# Patient Record
Sex: Male | Born: 1960 | Race: White | Hispanic: No | Marital: Married | Smoking: Never smoker
Health system: Southern US, Community
[De-identification: ages and names within clinical notes are randomized; demographics above are authoritative.]

## PROBLEM LIST (undated history)

## (undated) DIAGNOSIS — Z87442 Personal history of urinary calculi: Secondary | ICD-10-CM

## (undated) DIAGNOSIS — E7800 Pure hypercholesterolemia, unspecified: Secondary | ICD-10-CM

## (undated) HISTORY — PX: HERNIA REPAIR: SHX51

---

## 2002-11-22 ENCOUNTER — Encounter: Payer: Self-pay | Admitting: Family Medicine

## 2002-11-22 ENCOUNTER — Encounter: Admission: RE | Admit: 2002-11-22 | Discharge: 2002-11-22 | Payer: Self-pay | Admitting: Family Medicine

## 2006-06-10 ENCOUNTER — Emergency Department (HOSPITAL_COMMUNITY): Admission: EM | Admit: 2006-06-10 | Discharge: 2006-06-11 | Payer: Self-pay | Admitting: Emergency Medicine

## 2008-07-31 ENCOUNTER — Ambulatory Visit: Payer: Self-pay | Admitting: Family Medicine

## 2008-07-31 DIAGNOSIS — M5430 Sciatica, unspecified side: Secondary | ICD-10-CM | POA: Insufficient documentation

## 2008-07-31 DIAGNOSIS — E785 Hyperlipidemia, unspecified: Secondary | ICD-10-CM | POA: Insufficient documentation

## 2008-08-01 ENCOUNTER — Encounter: Admission: RE | Admit: 2008-08-01 | Discharge: 2008-09-21 | Payer: Self-pay | Admitting: Family Medicine

## 2008-08-09 ENCOUNTER — Encounter: Payer: Self-pay | Admitting: Family Medicine

## 2008-08-22 ENCOUNTER — Encounter: Payer: Self-pay | Admitting: Family Medicine

## 2008-10-30 ENCOUNTER — Telehealth: Payer: Self-pay | Admitting: Family Medicine

## 2008-11-16 ENCOUNTER — Encounter: Payer: Self-pay | Admitting: Family Medicine

## 2008-11-17 LAB — CONVERTED CEMR LAB
ALT: 22 units/L (ref 0–53)
AST: 18 units/L (ref 0–37)
Albumin: 4.9 g/dL (ref 3.5–5.2)
Alkaline Phosphatase: 51 units/L (ref 39–117)
BUN: 20 mg/dL (ref 6–23)
CO2: 21 meq/L (ref 19–32)
Calcium: 9.8 mg/dL (ref 8.4–10.5)
Chloride: 103 meq/L (ref 96–112)
Cholesterol: 252 mg/dL — ABNORMAL HIGH (ref 0–200)
Creatinine, Ser: 0.87 mg/dL (ref 0.40–1.50)
Glucose, Bld: 95 mg/dL (ref 70–99)
HDL: 40 mg/dL (ref >39)
LDL Cholesterol: 169 mg/dL — ABNORMAL HIGH (ref 0–99)
Potassium: 4.4 meq/L (ref 3.5–5.3)
Sodium: 139 meq/L (ref 135–145)
Total Bilirubin: 1.3 mg/dL — ABNORMAL HIGH (ref 0.3–1.2)
Total CHOL/HDL Ratio: 6.3
Total Protein: 7.7 g/dL (ref 6.0–8.3)
Triglycerides: 216 mg/dL — ABNORMAL HIGH (ref <150)
VLDL: 43 mg/dL — ABNORMAL HIGH (ref 0–40)

## 2010-10-22 NOTE — Assessment & Plan Note (Signed)
Summary: NOV sciatica   Vital Signs:  Patient Profile:   50 Years Old Male Height:     71 inches Weight:      186 pounds BMI:     26.04 Pulse rate:   62 / minute BP sitting:   134 / 88  (left arm) Cuff size:   regular  Vitals Entered By: Harlene Salts (July 31, 2008 2:58 PM)                 PCP:  Seymour Bars DO  Chief Complaint:  NOV, low back pain since Wednesday, and Back pain.  History of Present Illness:  Back Pain      This is a 50 year old man who presents with Back pain.  The symptoms began duration > 3 days ago.  Hx of Sciatica.  This started 5 days ago.  Hurt on the way up from leaning over.  Bilat tingling, worse on the R now.  The patient denies fever, weakness, loss of sensation, urinary incontinence, urinary retention, and dysuria.  The pain is located in the right low back and right SI joint.  The pain began at home and suddenly.  The pain radiates to the right buttock and right leg below the knee.  The pain is made worse by standing or walking and flexion.  The pain is made better by inactivity, NSAID medications, sitting or bending forward, and heat.      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    Married.  Has 2 kids (at [facility]).    Never smoked.    1 ETOH/ wk    Runs 25 miles/ wk    Healthy diet.   Risk Factors:  Caffeine use:  1 drinks per day  Family History Risk Factors:    Family History of MI in females < 48 years old:  no    Family History of MI in males < 110 years old:  no   Review of Systems       no fevers/sweats/weakness, unexplained wt loss/gain, no change in vision, no difficulty hearing, ringing in ears, no hay fever/allergies, no CP/discomfort, no palpitations, no breast lump/nipple discharge, no  cough/wheeze, no blood in stool, no N/V/D, no nocturia, no leaking urine, no unusual vag bleeding, no vaginal/penile discharge, no muscle/joint pain, no rash, no new/changing mole, no HA, no memory loss, no anxiety, no sleep problem, no depression, no unexplained lumps, no easy bruising/bleeding, no concern with sexual function    Physical Exam  General:     alert, well-developed, well-nourished, and well-hydrated.   Head:     normocephalic and atraumatic.   Mouth:     good dentition and pharynx pink and moist.   Lungs:     Normal respiratory effort, chest expands symmetrically. Lungs are clear to auscultation, no crackles or wheezes. Heart:     Normal rate and regular rhythm. S1 and S2 normal without gallop, murmur, click, rub or other extra sounds. Msk:     R sciatic notch tender active L spine flexion to 75 deg with 5 deg extension.  + straight leg raise bilat. tender at L5-S1 R>L side.    Extremities:     no LE edema Neurologic:  gait normal and DTRs symmetrical and normal.  normal heel toe gait Skin:     color normal.      Impression & Recommendations:  Problem # 1:  SCIATICA, ACUTE (ICD-724.3) Recurrent.  No red flags.  Treat with Rx NSAIDs, heat and PT.  OK to resume running once pain subsides ( ~1 wk).   His updated medication list for this problem includes:    Aspirin Adult Low Strength 81 Mg Tbec (Aspirin) .Marland Kitchen... Take 1 tablet by mouth once a day    Piroxicam 20 Mg Caps (Piroxicam) .Marland Kitchen... 1 tab by mouth daily w/ food  Orders: Physical Therapy Referral (PT)   Problem # 2:  HYPERLIPIDEMIA (ICD-272.4) OK to stop statin in the next 30 days and take OTC Fish Oil 4 grams per day.  Already eating healthy and exercising.  Will get old records and plan to check labs off meds > 8 wks.   His updated medication list for this problem includes:    Pravachol 40 Mg Tabs (Pravastatin sodium) .Marland Kitchen... Take two by mouth daily   Complete Medication List: 1)  Pravachol 40 Mg Tabs  (Pravastatin sodium) .... Take two by mouth daily 2)  Aspirin Adult Low Strength 81 Mg Tbec (Aspirin) .... Take 1 tablet by mouth once a day 3)  Piroxicam 20 Mg Caps (Piroxicam) .Marland Kitchen.. 1 tab by mouth daily w/ food   Patient Instructions: 1)  For acute back pain/ sciatica: 2)  Piroxicam daily w/ food x 10-14 days. 3)  Heat, physical therapy. 4)  OK to run after 1 wk if pain improving, otherwise, walk. 5)  No heavy lifting or prolonged sitting. 6)  Finish up Pravastatin and f/u to recheck cholesterol off medicine in 3 months.   Prescriptions: PIROXICAM 20 MG CAPS (PIROXICAM) 1 tab by mouth daily w/ food  #14 x 0   Entered and Authorized by:   Seymour Bars DO   Signed by:   Seymour Bars DO on 07/31/2008   Method used:   Electronically to        [facility]  [place] [address] (retail)       [address]       [place], [place]  [address]       Ph: [phone] or [phone]       Fax: [phone]   RxID:   [id number]  ]

## 2010-10-22 NOTE — Miscellaneous (Signed)
Summary: Initial Summary/[facility]  Initial Summary/[facility]   Imported By: Lanelle Bal 08/21/2008 15:07:16  _____________________________________________________________________  External Attachment:    Type:   Image     Comment:   External Document

## 2010-10-22 NOTE — Progress Notes (Signed)
Summary: NEED LAB ORDER FOR CHOLESTEROL CHECK  Phone Note Call from Patient Call back at Work Phone [phone]   Caller: Patient Call For: Christopher Bars DO Summary of Call: want lab order for cholesterol check faxed to [facility]. Initial call taken by: Harlene Salts,  October 30, 2008 1:59 PM      Appended Document: NEED LAB ORDER FOR CHOLESTEROL CHECK PATIENT INFORMED AND LAB ORDER FAXED.LM

## 2010-10-22 NOTE — Miscellaneous (Signed)
Summary: Discharge/[facility]  Discharge/[facility]   Imported By: Lanelle Bal 09/04/2008 13:58:37  _____________________________________________________________________  External Attachment:    Type:   Image     Comment:   External Document

## 2020-04-18 ENCOUNTER — Other Ambulatory Visit: Payer: Self-pay | Admitting: Radiation Therapy

## 2020-04-20 ENCOUNTER — Ambulatory Visit
Admission: RE | Admit: 2020-04-20 | Discharge: 2020-04-20 | Disposition: A | Discharge status: Home / Self Care | Payer: 59 | Source: Ambulatory Visit | Attending: Neurosurgery | Admitting: Neurosurgery

## 2020-04-20 ENCOUNTER — Other Ambulatory Visit: Payer: Self-pay | Admitting: Neurosurgery

## 2020-04-20 ENCOUNTER — Other Ambulatory Visit (HOSPITAL_COMMUNITY): Payer: Self-pay | Admitting: Neurosurgery

## 2020-04-20 ENCOUNTER — Other Ambulatory Visit: Payer: Self-pay

## 2020-04-20 DIAGNOSIS — D496 Neoplasm of unspecified behavior of brain: Secondary | ICD-10-CM

## 2020-04-20 IMAGING — CT CT CHEST-ABD-PELV W/ CM
3 of 5 series · 14 of 36 positions shown, 16 images · IV contrast (omnipaque)
Comparison: None.

CLINICAL DATA: Newly diagnosed brain tumor.

EXAM:
CT CHEST, ABDOMEN, AND PELVIS WITH CONTRAST
TECHNIQUE: Multidetector CT imaging of the chest, abdomen and pelvis was
performed following the standard protocol during bolus
administration of intravenous contrast.
CONTRAST:  100 mL OMNIPAQUE IOHEXOL 300 MG/ML  SOLN

[Series 2: cap with · axial · 0.84mm/px · z∈[-1098,-538]mm · 9 of 142 slices shown, 11 images]
[im 15/142  mediastinal]
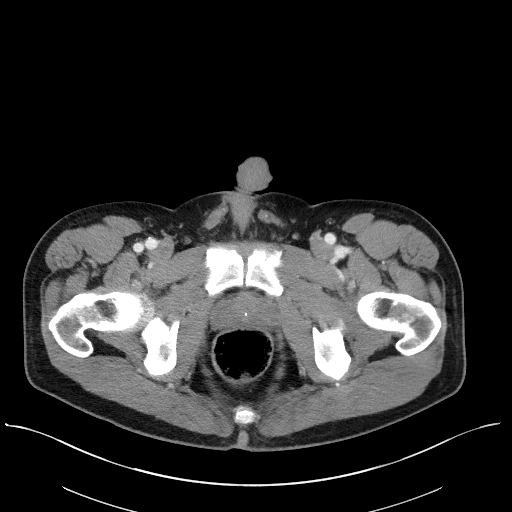
[im 15/142  bone]
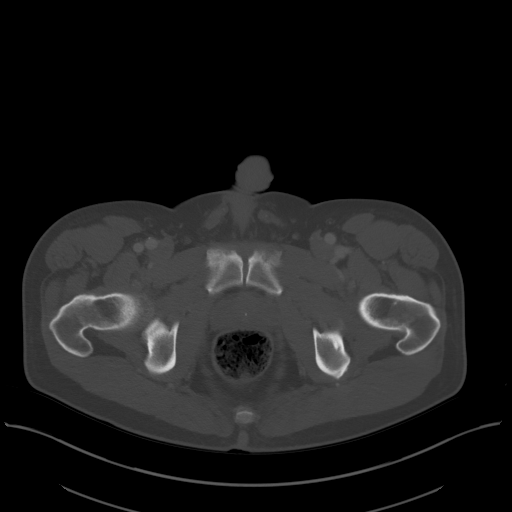
[im 29/142  mediastinal]
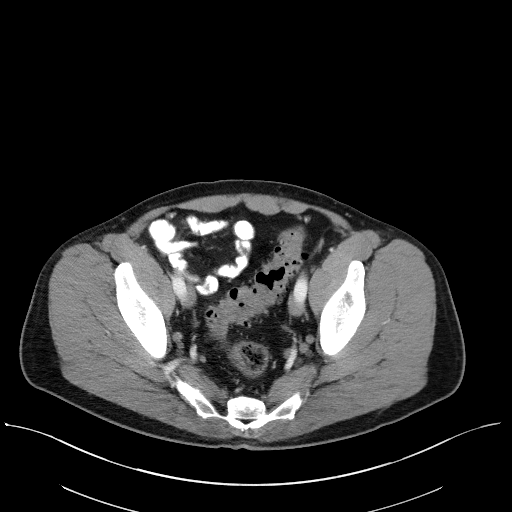
[im 43/142  mediastinal]
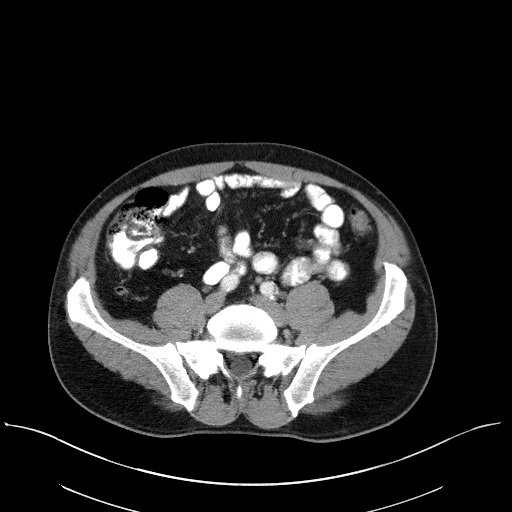
[im 57/142  mediastinal]
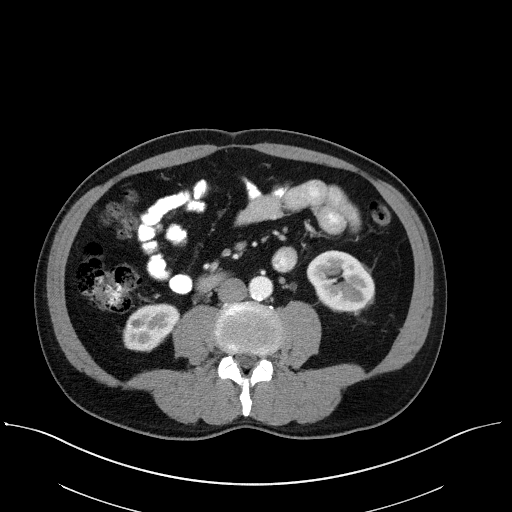
[im 71/142  mediastinal]
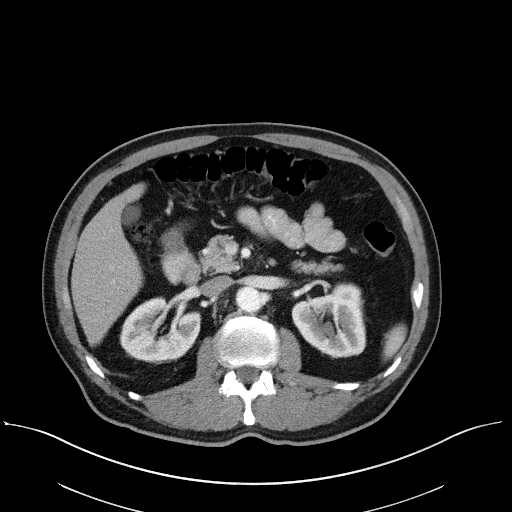
[im 85/142  mediastinal]
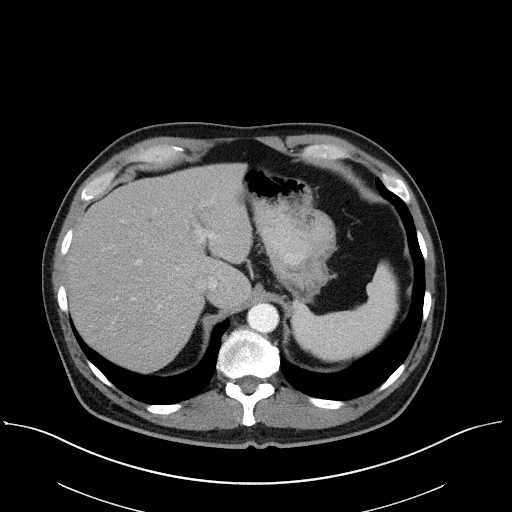
[im 99/142  mediastinal]
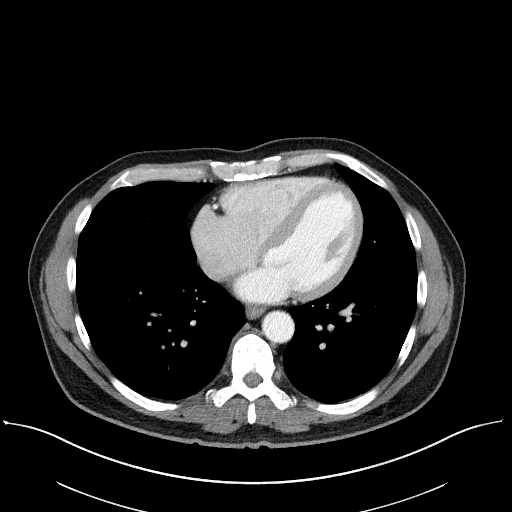
[im 113/142  mediastinal]
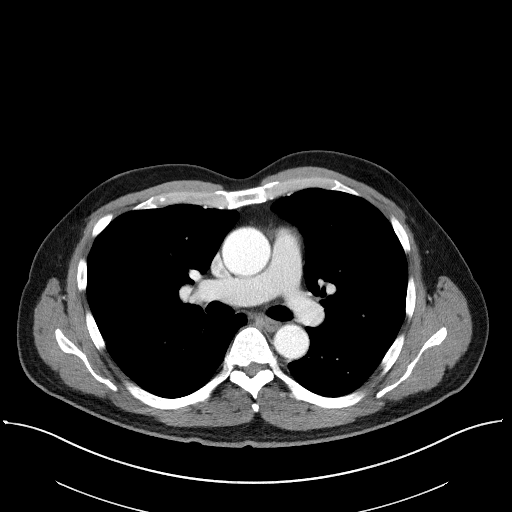
[im 127/142  mediastinal]
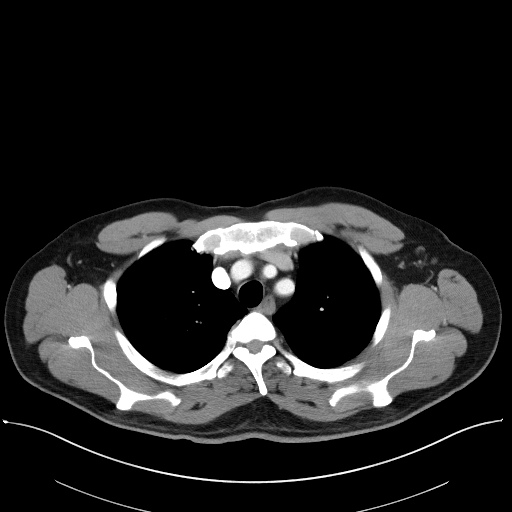
[im 127/142  bone]
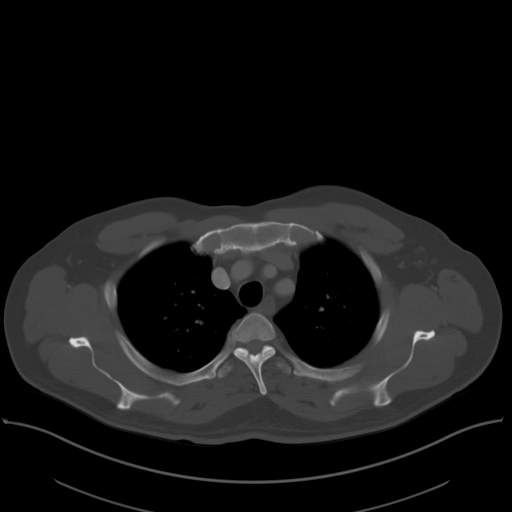

[Series 4: lung · axial · 0.84mm/px · z∈[-787,-733]mm · 2 of 176 slices shown]
[im 14/176  bone]
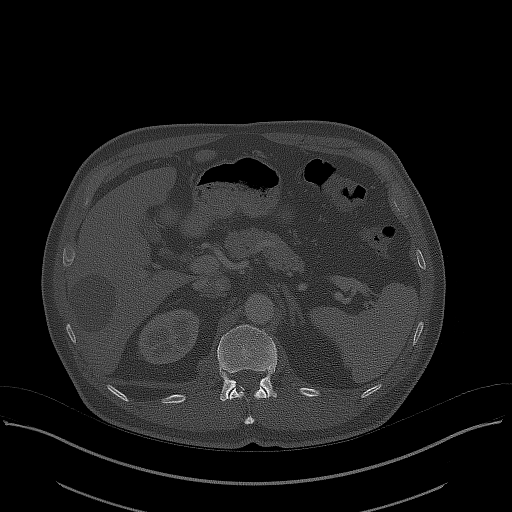
[im 41/176  bone]
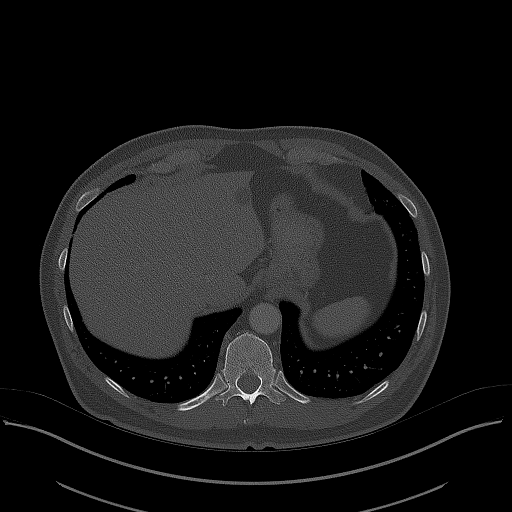

[Series 5: coronals · coronal · 0.78mm/px · 3 of 140 slices shown]
[im 28/140  mediastinal]
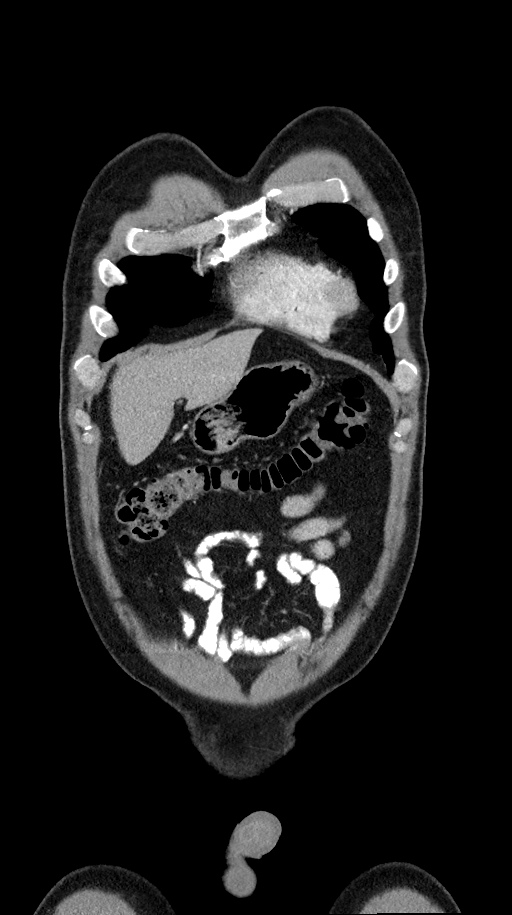
[im 56/140  mediastinal]
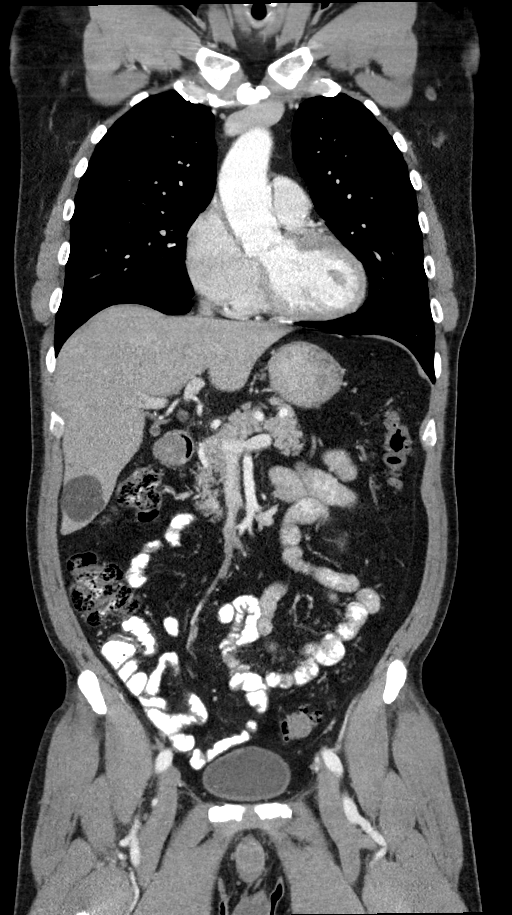
[im 84/140  mediastinal]
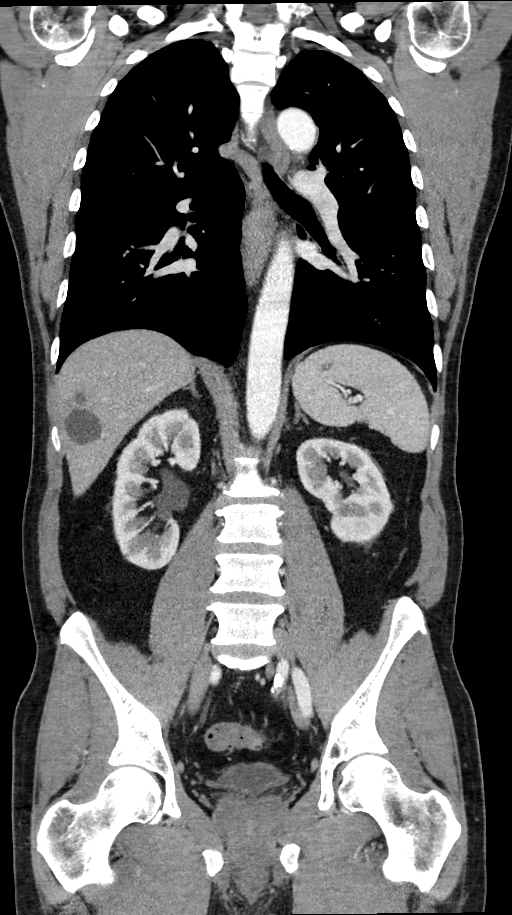

[14 of 36 positions shown; findings below may reference images not displayed]

FINDINGS: CT CHEST FINDINGS

Cardiovascular: No significant vascular findings. Normal heart size.
No pericardial effusion.

Mediastinum/Nodes: No enlarged mediastinal, hilar, or axillary lymph
nodes. Thyroid gland, trachea, and esophagus demonstrate no
significant findings.

Lungs/Pleura: Lungs are clear. No pleural effusion or pneumothorax.

Musculoskeletal: No chest wall mass or suspicious bone lesions
identified.

CT ABDOMEN PELVIS FINDINGS

Hepatobiliary: Scattered cysts are seen in the liver. The largest is
in the right hepatic lobe measuring 4.7 cm in diameter. Some of the
lesions are too small to definitively characterize but likely cysts.
Gallbladder and biliary tree appear normal.

Pancreas: Unremarkable. No pancreatic ductal dilatation or
surrounding inflammatory changes.

Spleen: Normal in size without focal abnormality.

Adrenals/Urinary Tract: The adrenal glands appear normal. A few
small renal cysts are present bilaterally. The kidneys are otherwise
normal in appearance. Ureters and urinary bladder appear normal.

Stomach/Bowel: Stomach is within normal limits. Appendix appears
normal. No evidence of bowel wall thickening, distention, or
inflammatory changes. Sigmoid diverticulosis is noted.

Vascular/Lymphatic: Aortic atherosclerosis. No enlarged abdominal or
pelvic lymph nodes.

Reproductive: Prostate is unremarkable. Small right hydrocele is
noted.

Other: None.

Musculoskeletal: No acute or significant osseous findings.
IMPRESSION: Negative for primary or metastatic neoplasm in the chest, abdomen or
pelvis. No acute abnormality.

Sigmoid diverticulosis without diverticulitis.

Hepatic and renal cysts.

Small right hydrocele is partially imaged.

Aortic Atherosclerosis ([Z6]-[Z6]).

## 2020-04-20 MED ORDER — IOHEXOL 300 MG/ML  SOLN
100.0000 mL | Freq: Once | INTRAMUSCULAR | Status: AC | PRN
  Administered 2020-04-20: 100 mL via INTRAVENOUS

## 2020-04-23 ENCOUNTER — Inpatient Hospital Stay: Payer: 59 | Attending: Neurosurgery

## 2020-04-23 ENCOUNTER — Other Ambulatory Visit: Payer: Self-pay | Admitting: Neurosurgery

## 2020-04-24 ENCOUNTER — Other Ambulatory Visit: Payer: Self-pay

## 2020-04-24 ENCOUNTER — Other Ambulatory Visit (HOSPITAL_COMMUNITY)
Admission: RE | Admit: 2020-04-24 | Discharge: 2020-04-24 | Disposition: A | Discharge status: Home / Self Care | Payer: 59 | Source: Ambulatory Visit | Attending: Neurosurgery | Admitting: Neurosurgery

## 2020-04-24 ENCOUNTER — Encounter (HOSPITAL_COMMUNITY): Payer: Self-pay | Admitting: Neurosurgery

## 2020-04-24 DIAGNOSIS — Z20822 Contact with and (suspected) exposure to covid-19: Secondary | ICD-10-CM | POA: Insufficient documentation

## 2020-04-24 DIAGNOSIS — Z01812 Encounter for preprocedural laboratory examination: Secondary | ICD-10-CM | POA: Insufficient documentation

## 2020-04-24 LAB — SARS CORONAVIRUS 2 (TAT 6-24 HRS): SARS Coronavirus 2: NEGATIVE

## 2020-04-24 NOTE — Anesthesia Preprocedure Evaluation (Addendum)
Anesthesia Evaluation  Patient identified by MRN, date of birth, ID band Patient awake    Reviewed: Allergy & Precautions, NPO status , Patient's Chart, lab work & pertinent test results  Airway Mallampati: II  TM Distance: >3 FB Neck ROM: Full    Dental no notable dental hx. (+) Teeth Intact, Dental Advisory Given   Pulmonary neg pulmonary ROS,    Pulmonary exam normal breath sounds clear to auscultation       Cardiovascular Normal cardiovascular exam Rhythm:Regular Rate:Normal  HLD   Neuro/Psych negative neurological ROS  negative psych ROS   GI/Hepatic negative GI ROS, Neg liver ROS,   Endo/Other  negative endocrine ROS  Renal/GU negative Renal ROS  negative genitourinary   Musculoskeletal negative musculoskeletal ROS (+)   Abdominal   Peds  Hematology negative hematology ROS (+)   Anesthesia Other Findings   Reproductive/Obstetrics                            Anesthesia Physical Anesthesia Plan  ASA: II  Anesthesia Plan: General   Post-op Pain Management:    Induction: Intravenous  PONV Risk Score and Plan: Midazolam, Dexamethasone and Ondansetron  Airway Management Planned: Oral ETT  Additional Equipment: Arterial line  Intra-op Plan:   Post-operative Plan: Extubation in OR  Informed Consent: I have reviewed the patients History and Physical, chart, labs and discussed the procedure including the risks, benefits and alternatives for the proposed anesthesia with the patient or authorized representative who has indicated his/her understanding and acceptance.     Dental advisory given  Plan Discussed with: CRNA  Anesthesia Plan Comments: (2 IVs)        Anesthesia Quick Evaluation

## 2020-04-24 NOTE — Progress Notes (Signed)
Spoke with pt for pre-op call. Pt denies cardiac hx, HTN or Diabetes.  Covid test done today. Pt states he's in quarantine and will stay in it until he comes to the hospital tomorrow.

## 2020-04-25 ENCOUNTER — Other Ambulatory Visit: Payer: Self-pay

## 2020-04-25 ENCOUNTER — Encounter (HOSPITAL_COMMUNITY)
Admission: RE | Disposition: A | Discharge status: Home / Self Care | Payer: Self-pay | Source: Home / Self Care | Attending: Neurosurgery

## 2020-04-25 ENCOUNTER — Inpatient Hospital Stay (HOSPITAL_COMMUNITY): Payer: 59 | Admitting: Anesthesiology

## 2020-04-25 ENCOUNTER — Encounter (HOSPITAL_COMMUNITY): Payer: Self-pay | Admitting: Neurosurgery

## 2020-04-25 ENCOUNTER — Inpatient Hospital Stay (HOSPITAL_COMMUNITY)
Admission: RE | Admit: 2020-04-25 | Discharge: 2020-04-26 | DRG: 027 | Disposition: A | Discharge status: Home / Self Care | Payer: 59 | Attending: Neurosurgery | Admitting: Neurosurgery

## 2020-04-25 DIAGNOSIS — C719 Malignant neoplasm of brain, unspecified: Secondary | ICD-10-CM | POA: Diagnosis present

## 2020-04-25 DIAGNOSIS — D496 Neoplasm of unspecified behavior of brain: Secondary | ICD-10-CM | POA: Diagnosis present

## 2020-04-25 DIAGNOSIS — Z20822 Contact with and (suspected) exposure to covid-19: Secondary | ICD-10-CM | POA: Diagnosis present

## 2020-04-25 DIAGNOSIS — E785 Hyperlipidemia, unspecified: Secondary | ICD-10-CM | POA: Diagnosis present

## 2020-04-25 DIAGNOSIS — E7800 Pure hypercholesterolemia, unspecified: Secondary | ICD-10-CM | POA: Diagnosis present

## 2020-04-25 DIAGNOSIS — Z87442 Personal history of urinary calculi: Secondary | ICD-10-CM | POA: Diagnosis not present

## 2020-04-25 DIAGNOSIS — Z79899 Other long term (current) drug therapy: Secondary | ICD-10-CM | POA: Diagnosis not present

## 2020-04-25 HISTORY — PX: CRANIOTOMY: SHX93

## 2020-04-25 HISTORY — PX: APPLICATION OF CRANIAL NAVIGATION: SHX6578

## 2020-04-25 HISTORY — DX: Personal history of urinary calculi: Z87.442

## 2020-04-25 HISTORY — DX: Pure hypercholesterolemia, unspecified: E78.00

## 2020-04-25 LAB — CBC
HCT: 46.5 % (ref 39.0–52.0)
Hemoglobin: 15.7 g/dL (ref 13.0–17.0)
MCH: 29.8 pg (ref 26.0–34.0)
MCHC: 33.8 g/dL (ref 30.0–36.0)
MCV: 88.2 fL (ref 80.0–100.0)
Platelets: 171 10*3/uL (ref 150–400)
RBC: 5.27 MIL/uL (ref 4.22–5.81)
RDW: 11.9 % (ref 11.5–15.5)
WBC: 4.8 10*3/uL (ref 4.0–10.5)
nRBC: 0 % (ref 0.0–0.2)

## 2020-04-25 LAB — ABO/RH: ABO/RH(D): A POS

## 2020-04-25 LAB — TYPE AND SCREEN
ABO/RH(D): A POS
Antibody Screen: NEGATIVE

## 2020-04-25 SURGERY — CRANIOTOMY TUMOR EXCISION
Anesthesia: General | Site: Head | Laterality: Left

## 2020-04-25 MED ORDER — FENTANYL CITRATE (PF) 100 MCG/2ML IJ SOLN: 25.0000 ug | INTRAMUSCULAR | Status: DC | PRN

## 2020-04-25 MED ORDER — SIMVASTATIN 20 MG PO TABS
40.0000 mg | ORAL_TABLET | Freq: Every day | ORAL | Status: DC
  Administered 2020-04-25: 40 mg via ORAL
  Filled 2020-04-25: qty 2

## 2020-04-25 MED ORDER — BUPIVACAINE HCL (PF) 0.5 % IJ SOLN
INTRAMUSCULAR | Status: DC | PRN
  Administered 2020-04-25: 4 mL

## 2020-04-25 MED ORDER — PROPOFOL 10 MG/ML IV BOLUS
INTRAVENOUS | Status: DC | PRN
  Administered 2020-04-25: 200 mg via INTRAVENOUS

## 2020-04-25 MED ORDER — ONDANSETRON HCL 4 MG/2ML IJ SOLN
INTRAMUSCULAR | Status: DC | PRN
  Administered 2020-04-25: 4 mg via INTRAVENOUS

## 2020-04-25 MED ORDER — BACITRACIN ZINC 500 UNIT/GM EX OINT
TOPICAL_OINTMENT | CUTANEOUS | Status: AC
  Filled 2020-04-25: qty 28.35

## 2020-04-25 MED ORDER — CHLORHEXIDINE GLUCONATE 0.12 % MT SOLN
OROMUCOSAL | Status: AC
  Administered 2020-04-25: 15 mL
  Filled 2020-04-25: qty 15

## 2020-04-25 MED ORDER — THROMBIN 5000 UNITS EX SOLR: OROMUCOSAL | Status: DC | PRN

## 2020-04-25 MED ORDER — LIDOCAINE 2% (20 MG/ML) 5 ML SYRINGE
INTRAMUSCULAR | Status: DC | PRN
  Administered 2020-04-25: 60 mg via INTRAVENOUS

## 2020-04-25 MED ORDER — CHLORHEXIDINE GLUCONATE CLOTH 2 % EX PADS: 6.0000 | MEDICATED_PAD | Freq: Once | CUTANEOUS | Status: DC

## 2020-04-25 MED ORDER — CEFAZOLIN SODIUM-DEXTROSE 2-4 GM/100ML-% IV SOLN
2.0000 g | INTRAVENOUS | Status: AC
  Administered 2020-04-25: 2 g via INTRAVENOUS
  Filled 2020-04-25: qty 100

## 2020-04-25 MED ORDER — ACETAMINOPHEN 325 MG PO TABS: 650.0000 mg | ORAL_TABLET | ORAL | Status: DC | PRN

## 2020-04-25 MED ORDER — SODIUM CHLORIDE 0.9 % IV SOLN: INTRAVENOUS | Status: DC | PRN

## 2020-04-25 MED ORDER — MIDAZOLAM HCL 5 MG/5ML IJ SOLN
INTRAMUSCULAR | Status: DC | PRN
  Administered 2020-04-25 (×2): 1 mg via INTRAVENOUS

## 2020-04-25 MED ORDER — ROCURONIUM BROMIDE 10 MG/ML (PF) SYRINGE
PREFILLED_SYRINGE | INTRAVENOUS | Status: DC | PRN
  Administered 2020-04-25: 80 mg via INTRAVENOUS
  Administered 2020-04-25: 20 mg via INTRAVENOUS
  Administered 2020-04-25: 10 mg via INTRAVENOUS

## 2020-04-25 MED ORDER — LIDOCAINE-EPINEPHRINE 1 %-1:100000 IJ SOLN
INTRAMUSCULAR | Status: DC | PRN
  Administered 2020-04-25: 4 mL

## 2020-04-25 MED ORDER — ONDANSETRON HCL 4 MG/2ML IJ SOLN: 4.0000 mg | INTRAMUSCULAR | Status: DC | PRN

## 2020-04-25 MED ORDER — BACITRACIN ZINC 500 UNIT/GM EX OINT
TOPICAL_OINTMENT | CUTANEOUS | Status: DC | PRN
  Administered 2020-04-25 (×2): 1 via TOPICAL

## 2020-04-25 MED ORDER — PANTOPRAZOLE SODIUM 40 MG IV SOLR
40.0000 mg | Freq: Every day | INTRAVENOUS | Status: DC
  Administered 2020-04-25: 40 mg via INTRAVENOUS
  Filled 2020-04-25: qty 40

## 2020-04-25 MED ORDER — EPHEDRINE SULFATE 50 MG/ML IJ SOLN
INTRAMUSCULAR | Status: DC | PRN
  Administered 2020-04-25: 10 mg via INTRAVENOUS

## 2020-04-25 MED ORDER — PHENYLEPHRINE HCL-NACL 10-0.9 MG/250ML-% IV SOLN
INTRAVENOUS | Status: DC | PRN
  Administered 2020-04-25: 25 ug/min via INTRAVENOUS

## 2020-04-25 MED ORDER — SENNA 8.6 MG PO TABS
1.0000 | ORAL_TABLET | Freq: Two times a day (BID) | ORAL | Status: DC
  Administered 2020-04-25 – 2020-04-26 (×2): 8.6 mg via ORAL
  Filled 2020-04-25 (×2): qty 1

## 2020-04-25 MED ORDER — SODIUM CHLORIDE 0.9 % IV SOLN
INTRAVENOUS | Status: DC | PRN
  Administered 2020-04-25: 1000 mg via INTRAVENOUS

## 2020-04-25 MED ORDER — SODIUM CHLORIDE 0.9 % IV SOLN: INTRAVENOUS | Status: DC

## 2020-04-25 MED ORDER — THROMBIN 20000 UNITS EX SOLR: CUTANEOUS | Status: DC | PRN

## 2020-04-25 MED ORDER — NALOXONE HCL 0.4 MG/ML IJ SOLN: 0.0800 mg | INTRAMUSCULAR | Status: DC | PRN

## 2020-04-25 MED ORDER — THROMBIN 5000 UNITS EX SOLR
CUTANEOUS | Status: AC
  Filled 2020-04-25: qty 5000

## 2020-04-25 MED ORDER — LABETALOL HCL 5 MG/ML IV SOLN
10.0000 mg | INTRAVENOUS | Status: DC | PRN
  Administered 2020-04-25: 5 mg via INTRAVENOUS
  Filled 2020-04-25: qty 4

## 2020-04-25 MED ORDER — CEFAZOLIN SODIUM-DEXTROSE 2-4 GM/100ML-% IV SOLN
2.0000 g | Freq: Three times a day (TID) | INTRAVENOUS | Status: AC
  Administered 2020-04-25 – 2020-04-26 (×2): 2 g via INTRAVENOUS
  Filled 2020-04-25 (×2): qty 100

## 2020-04-25 MED ORDER — ONDANSETRON HCL 4 MG PO TABS: 4.0000 mg | ORAL_TABLET | ORAL | Status: DC | PRN

## 2020-04-25 MED ORDER — LACTATED RINGERS IV SOLN: INTRAVENOUS | Status: DC | PRN

## 2020-04-25 MED ORDER — HYDROMORPHONE HCL 1 MG/ML IJ SOLN
0.5000 mg | INTRAMUSCULAR | Status: DC | PRN
  Administered 2020-04-25 – 2020-04-26 (×2): 1 mg via INTRAVENOUS
  Filled 2020-04-25 (×2): qty 1

## 2020-04-25 MED ORDER — PROMETHAZINE HCL 12.5 MG PO TABS
12.5000 mg | ORAL_TABLET | ORAL | Status: DC | PRN
  Filled 2020-04-25: qty 2

## 2020-04-25 MED ORDER — HEMOSTATIC AGENTS (NO CHARGE) OPTIME
TOPICAL | Status: DC | PRN
  Administered 2020-04-25: 1 via TOPICAL

## 2020-04-25 MED ORDER — MIDAZOLAM HCL 2 MG/2ML IJ SOLN
INTRAMUSCULAR | Status: AC
  Filled 2020-04-25: qty 2

## 2020-04-25 MED ORDER — HYDROCODONE-ACETAMINOPHEN 5-325 MG PO TABS
1.0000 | ORAL_TABLET | ORAL | Status: DC | PRN
  Administered 2020-04-25 – 2020-04-26 (×2): 1 via ORAL
  Filled 2020-04-25 (×2): qty 1

## 2020-04-25 MED ORDER — BISACODYL 5 MG PO TBEC: 5.0000 mg | DELAYED_RELEASE_TABLET | Freq: Every day | ORAL | Status: DC | PRN

## 2020-04-25 MED ORDER — BUPIVACAINE HCL (PF) 0.5 % IJ SOLN
INTRAMUSCULAR | Status: AC
  Filled 2020-04-25: qty 30

## 2020-04-25 MED ORDER — POLYETHYLENE GLYCOL 3350 17 G PO PACK: 17.0000 g | PACK | Freq: Every day | ORAL | Status: DC | PRN

## 2020-04-25 MED ORDER — LIDOCAINE 2% (20 MG/ML) 5 ML SYRINGE
INTRAMUSCULAR | Status: AC
  Filled 2020-04-25: qty 5

## 2020-04-25 MED ORDER — EPHEDRINE 5 MG/ML INJ
INTRAVENOUS | Status: AC
  Filled 2020-04-25: qty 10

## 2020-04-25 MED ORDER — SUGAMMADEX SODIUM 500 MG/5ML IV SOLN
INTRAVENOUS | Status: AC
  Filled 2020-04-25: qty 5

## 2020-04-25 MED ORDER — FLEET ENEMA 7-19 GM/118ML RE ENEM: 1.0000 | ENEMA | Freq: Once | RECTAL | Status: DC | PRN

## 2020-04-25 MED ORDER — FENTANYL CITRATE (PF) 250 MCG/5ML IJ SOLN
INTRAMUSCULAR | Status: DC | PRN
  Administered 2020-04-25: 150 ug via INTRAVENOUS
  Administered 2020-04-25 (×2): 50 ug via INTRAVENOUS

## 2020-04-25 MED ORDER — CEFAZOLIN SODIUM-DEXTROSE 2-4 GM/100ML-% IV SOLN
INTRAVENOUS | Status: AC
  Filled 2020-04-25: qty 100

## 2020-04-25 MED ORDER — SUGAMMADEX SODIUM 200 MG/2ML IV SOLN
INTRAVENOUS | Status: DC | PRN
  Administered 2020-04-25: 300 mg via INTRAVENOUS

## 2020-04-25 MED ORDER — DEXAMETHASONE SODIUM PHOSPHATE 10 MG/ML IJ SOLN
INTRAMUSCULAR | Status: DC | PRN
  Administered 2020-04-25: 10 mg via INTRAVENOUS

## 2020-04-25 MED ORDER — GLYCOPYRROLATE PF 0.2 MG/ML IJ SOSY
PREFILLED_SYRINGE | INTRAMUSCULAR | Status: AC
  Filled 2020-04-25: qty 1

## 2020-04-25 MED ORDER — THROMBIN 20000 UNITS EX SOLR
CUTANEOUS | Status: AC
  Filled 2020-04-25: qty 20000

## 2020-04-25 MED ORDER — PROPOFOL 10 MG/ML IV BOLUS
INTRAVENOUS | Status: AC
  Filled 2020-04-25: qty 40

## 2020-04-25 MED ORDER — ROCURONIUM BROMIDE 10 MG/ML (PF) SYRINGE
PREFILLED_SYRINGE | INTRAVENOUS | Status: AC
  Filled 2020-04-25: qty 20

## 2020-04-25 MED ORDER — LIDOCAINE-EPINEPHRINE 1 %-1:100000 IJ SOLN
INTRAMUSCULAR | Status: AC
  Filled 2020-04-25: qty 1

## 2020-04-25 MED ORDER — FENTANYL CITRATE (PF) 250 MCG/5ML IJ SOLN
INTRAMUSCULAR | Status: AC
  Filled 2020-04-25: qty 5

## 2020-04-25 MED ORDER — LEVETIRACETAM IN NACL 500 MG/100ML IV SOLN
500.0000 mg | Freq: Two times a day (BID) | INTRAVENOUS | Status: DC
  Administered 2020-04-25 – 2020-04-26 (×2): 500 mg via INTRAVENOUS
  Filled 2020-04-25 (×2): qty 100

## 2020-04-25 MED ORDER — ONDANSETRON HCL 4 MG/2ML IJ SOLN
INTRAMUSCULAR | Status: AC
  Filled 2020-04-25: qty 2

## 2020-04-25 MED ORDER — LABETALOL HCL 5 MG/ML IV SOLN
INTRAVENOUS | Status: AC
  Filled 2020-04-25: qty 4

## 2020-04-25 MED ORDER — 0.9 % SODIUM CHLORIDE (POUR BTL) OPTIME
TOPICAL | Status: DC | PRN
  Administered 2020-04-25 (×3): 1000 mL

## 2020-04-25 MED ORDER — SODIUM CHLORIDE 0.9 % IR SOLN
Status: DC | PRN
  Administered 2020-04-25: 1000 mL

## 2020-04-25 MED ORDER — DEXAMETHASONE SODIUM PHOSPHATE 10 MG/ML IJ SOLN
INTRAMUSCULAR | Status: AC
  Filled 2020-04-25: qty 1

## 2020-04-25 MED ORDER — GLYCOPYRROLATE 0.2 MG/ML IJ SOLN
INTRAMUSCULAR | Status: DC | PRN
  Administered 2020-04-25: .2 mg via INTRAVENOUS

## 2020-04-25 MED ORDER — ACETAMINOPHEN 650 MG RE SUPP: 650.0000 mg | RECTAL | Status: DC | PRN

## 2020-04-25 SURGICAL SUPPLY — 110 items
APL SKNCLS STERI-STRIP NONHPOA (GAUZE/BANDAGES/DRESSINGS)
BAND INSRT 18 STRL LF DISP RB (MISCELLANEOUS)
BAND RUBBER #18 3X1/16 STRL (MISCELLANEOUS) IMPLANT
BENZOIN TINCTURE PRP APPL 2/3 (GAUZE/BANDAGES/DRESSINGS) IMPLANT
BLADE CLIPPER SURG (BLADE) ×2 IMPLANT
BLADE SAW GIGLI 16 STRL (MISCELLANEOUS) IMPLANT
BLADE SURG 15 STRL LF DISP TIS (BLADE) IMPLANT
BLADE SURG 15 STRL SS (BLADE)
BLADE ULTRA TIP 2M (BLADE) ×2 IMPLANT
BNDG CMPR 75X41 PLY HI ABS (GAUZE/BANDAGES/DRESSINGS)
BNDG GAUZE ELAST 4 BULKY (GAUZE/BANDAGES/DRESSINGS) IMPLANT
BNDG STRETCH 4X75 STRL LF (GAUZE/BANDAGES/DRESSINGS) IMPLANT
BUR ACORN 6.0 PRECISION (BURR) ×2 IMPLANT
BUR ROUND FLUTED 4 SOFT TCH (BURR) IMPLANT
BUR SPIRAL ROUTER 2.3 (BUR) ×2 IMPLANT
CANISTER SUCT 3000ML PPV (MISCELLANEOUS) ×4 IMPLANT
CARTRIDGE OIL MAESTRO DRILL (MISCELLANEOUS) ×1 IMPLANT
CATH VENTRIC 35X38 W/TROCAR LG (CATHETERS) IMPLANT
CLIP VESOCCLUDE MED 6/CT (CLIP) IMPLANT
CNTNR URN SCR LID CUP LEK RST (MISCELLANEOUS) ×1 IMPLANT
CONT SPEC 4OZ STRL OR WHT (MISCELLANEOUS) ×2
COVER BURR HOLE 7 (Orthopedic Implant) ×2 IMPLANT
COVER MAYO STAND STRL (DRAPES) IMPLANT
COVER WAND RF STERILE (DRAPES) ×1 IMPLANT
DECANTER SPIKE VIAL GLASS SM (MISCELLANEOUS) ×2 IMPLANT
DIFFUSER DRILL AIR PNEUMATIC (MISCELLANEOUS) ×2 IMPLANT
DRAIN SUBARACHNOID (WOUND CARE) IMPLANT
DRAPE HALF SHEET 40X57 (DRAPES) ×2 IMPLANT
DRAPE MICROSCOPE LEICA (MISCELLANEOUS) ×1 IMPLANT
DRAPE NEUROLOGICAL W/INCISE (DRAPES) ×2 IMPLANT
DRAPE STERI IOBAN 125X83 (DRAPES) IMPLANT
DRAPE SURG 17X23 STRL (DRAPES) IMPLANT
DRAPE WARM FLUID 44X44 (DRAPES) ×2 IMPLANT
DRSG ADAPTIC 3X8 NADH LF (GAUZE/BANDAGES/DRESSINGS) IMPLANT
DRSG TELFA 3X8 NADH (GAUZE/BANDAGES/DRESSINGS) ×2 IMPLANT
DURAPREP 6ML APPLICATOR 50/CS (WOUND CARE) ×2 IMPLANT
ELECT REM PT RETURN 9FT ADLT (ELECTROSURGICAL) ×2
ELECTRODE REM PT RTRN 9FT ADLT (ELECTROSURGICAL) ×1 IMPLANT
EVACUATOR 1/8 PVC DRAIN (DRAIN) IMPLANT
EVACUATOR SILICONE 100CC (DRAIN) IMPLANT
FORCEPS BIPOLAR SPETZLER 8 1.0 (NEUROSURGERY SUPPLIES) ×2 IMPLANT
GAUZE 4X4 16PLY RFD (DISPOSABLE) IMPLANT
GAUZE SPONGE 4X4 12PLY STRL (GAUZE/BANDAGES/DRESSINGS) ×1 IMPLANT
GLOVE BIO SURGEON STRL SZ 6.5 (GLOVE) ×2 IMPLANT
GLOVE BIO SURGEON STRL SZ7.5 (GLOVE) ×2 IMPLANT
GLOVE BIOGEL PI IND STRL 6.5 (GLOVE) IMPLANT
GLOVE BIOGEL PI IND STRL 7.0 (GLOVE) IMPLANT
GLOVE BIOGEL PI IND STRL 7.5 (GLOVE) ×2 IMPLANT
GLOVE BIOGEL PI INDICATOR 6.5 (GLOVE) ×3
GLOVE BIOGEL PI INDICATOR 7.0 (GLOVE)
GLOVE BIOGEL PI INDICATOR 7.5 (GLOVE) ×3
GLOVE ECLIPSE 7.0 STRL STRAW (GLOVE) ×4 IMPLANT
GLOVE EXAM NITRILE XL STR (GLOVE) IMPLANT
GLOVE SURG SS PI 6.0 STRL IVOR (GLOVE) ×4 IMPLANT
GOWN STRL REUS W/ TWL LRG LVL3 (GOWN DISPOSABLE) ×2 IMPLANT
GOWN STRL REUS W/ TWL XL LVL3 (GOWN DISPOSABLE) IMPLANT
GOWN STRL REUS W/TWL 2XL LVL3 (GOWN DISPOSABLE) IMPLANT
GOWN STRL REUS W/TWL LRG LVL3 (GOWN DISPOSABLE) ×10
GOWN STRL REUS W/TWL XL LVL3 (GOWN DISPOSABLE) ×2
HEMOSTAT POWDER KIT SURGIFOAM (HEMOSTASIS) ×2 IMPLANT
HEMOSTAT SURGICEL 2X14 (HEMOSTASIS) ×1 IMPLANT
HOOK DURA 1/2IN (MISCELLANEOUS) ×2 IMPLANT
IV NS 1000ML (IV SOLUTION) ×2
IV NS 1000ML BAXH (IV SOLUTION) ×1 IMPLANT
KIT BASIN OR (CUSTOM PROCEDURE TRAY) ×2 IMPLANT
KIT DRAIN CSF ACCUDRAIN (MISCELLANEOUS) IMPLANT
KIT TURNOVER KIT B (KITS) ×2 IMPLANT
KNIFE ARACHNOID DISP AM-24-S (MISCELLANEOUS) ×1 IMPLANT
MARKER SPHERE PSV REFLC 13MM (MARKER) ×4 IMPLANT
NDL SPNL 18GX3.5 QUINCKE PK (NEEDLE) IMPLANT
NEEDLE HYPO 22GX1.5 SAFETY (NEEDLE) ×2 IMPLANT
NEEDLE SPNL 18GX3.5 QUINCKE PK (NEEDLE) IMPLANT
NS IRRIG 1000ML POUR BTL (IV SOLUTION) ×6 IMPLANT
OIL CARTRIDGE MAESTRO DRILL (MISCELLANEOUS) ×2
PACK BATTERY CMF DISP FOR DVR (ORTHOPEDIC DISPOSABLE SUPPLIES) ×1 IMPLANT
PACK CRANIOTOMY CUSTOM (CUSTOM PROCEDURE TRAY) ×2 IMPLANT
PAD DRESSING TELFA 3X8 NADH (GAUZE/BANDAGES/DRESSINGS) IMPLANT
PATTIES SURGICAL .25X.25 (GAUZE/BANDAGES/DRESSINGS) IMPLANT
PATTIES SURGICAL .5 X.5 (GAUZE/BANDAGES/DRESSINGS) ×1 IMPLANT
PATTIES SURGICAL .5 X3 (DISPOSABLE) IMPLANT
PATTIES SURGICAL 1/4 X 3 (GAUZE/BANDAGES/DRESSINGS) IMPLANT
PATTIES SURGICAL 1X1 (DISPOSABLE) IMPLANT
PIN MAYFIELD SKULL DISP (PIN) ×2 IMPLANT
SCREW UNIII AXS SD 1.5X4 (Screw) ×8 IMPLANT
SET CARTRIDGE AND TUBING (SET/KITS/TRAYS/PACK) ×1 IMPLANT
SPECIMEN JAR SMALL (MISCELLANEOUS) IMPLANT
SPONGE NEURO XRAY DETECT 1X3 (DISPOSABLE) IMPLANT
SPONGE SURGIFOAM ABS GEL 100 (HEMOSTASIS) ×2 IMPLANT
SPONGE SURGIFOAM ABS GEL SZ50 (HEMOSTASIS) ×1 IMPLANT
STAPLER VISISTAT 35W (STAPLE) ×2 IMPLANT
STOCKINETTE 6  STRL (DRAPES) ×2
STOCKINETTE 6 STRL (DRAPES) IMPLANT
SUT ETHILON 3 0 FSL (SUTURE) IMPLANT
SUT ETHILON 3 0 PS 1 (SUTURE) IMPLANT
SUT NURALON 4 0 TR CR/8 (SUTURE) ×5 IMPLANT
SUT SILK 0 TIES 10X30 (SUTURE) IMPLANT
SUT VIC AB 0 CT1 18XCR BRD8 (SUTURE) ×2 IMPLANT
SUT VIC AB 0 CT1 8-18 (SUTURE) ×4
SUT VIC AB 3-0 SH 8-18 (SUTURE) ×4 IMPLANT
TAPE CLOTH 1X10 TAN NS (GAUZE/BANDAGES/DRESSINGS) ×1 IMPLANT
TIP STANDARD 36KHZ (INSTRUMENTS) ×2
TIP STD 36KHZ (INSTRUMENTS) IMPLANT
TOWEL GREEN STERILE (TOWEL DISPOSABLE) ×2 IMPLANT
TOWEL GREEN STERILE FF (TOWEL DISPOSABLE) ×2 IMPLANT
TRAY FOLEY MTR SLVR 16FR STAT (SET/KITS/TRAYS/PACK) ×2 IMPLANT
TUBE CONNECTING 12X1/4 (SUCTIONS) ×1 IMPLANT
TUBE CONNECTING 20X1/4 (TUBING) ×1 IMPLANT
UNDERPAD 30X36 HEAVY ABSORB (UNDERPADS AND DIAPERS) ×1 IMPLANT
WATER STERILE IRR 1000ML POUR (IV SOLUTION) ×2 IMPLANT
WRENCH TORQUE 36KHZ (INSTRUMENTS) ×1 IMPLANT

## 2020-04-25 NOTE — Anesthesia Postprocedure Evaluation (Signed)
Anesthesia Post Note  Patient: Christopher Tucker  Procedure(s) Performed: STEREOTACTIC LEFT TEMPORAL CRANIOTOMY FOR TUMOR (Left Head) APPLICATION OF CRANIAL NAVIGATION (Left Head)     Patient location during evaluation: PACU Anesthesia Type: General Level of consciousness: awake and alert Pain management: pain level controlled Vital Signs Assessment: post-procedure vital signs reviewed and stable Respiratory status: spontaneous breathing, nonlabored ventilation, respiratory function stable and patient connected to nasal cannula oxygen Cardiovascular status: blood pressure returned to baseline and stable Postop Assessment: no apparent nausea or vomiting Anesthetic complications: no   No complications documented.  Last Vitals:  Vitals:   04/25/20 1315 04/25/20 1345  BP: 127/88 131/88  Pulse: 66 83  Resp: 18 (!) 22  Temp:  (!) 36.3 C  SpO2: 97% 99%    Last Pain:  Vitals:   04/25/20 1345  TempSrc:   PainSc: 0-No pain                 Alizabeth Antonio L Loran Fleet

## 2020-04-25 NOTE — Anesthesia Procedure Notes (Signed)
Procedure Name: Intubation Date/Time: 04/25/2020 9:04 AM Performed by: Mariea Clonts, CRNA Pre-anesthesia Checklist: Patient identified, Emergency Drugs available, Suction available and Patient being monitored Patient Re-evaluated:Patient Re-evaluated prior to induction Oxygen Delivery Method: Circle System Utilized Preoxygenation: Pre-oxygenation with 100% oxygen Induction Type: IV induction and Cricoid Pressure applied Ventilation: Mask ventilation without difficulty Laryngoscope Size: Miller and 2 Grade View: Grade II Tube type: Oral Tube size: 7.5 mm Number of attempts: 1 Airway Equipment and Method: Stylet and Oral airway Placement Confirmation: ETT inserted through vocal cords under direct vision,  positive ETCO2 and breath sounds checked- equal and bilateral Tube secured with: Tape Dental Injury: Teeth and Oropharynx as per pre-operative assessment  Comments: Firm cricoid and head lift required for grade 2 view with miller 2

## 2020-04-25 NOTE — Op Note (Signed)
NEUROSURGERY OPERATIVE NOTE   PREOP DIAGNOSIS:  Left temporal tumor   POSTOP DIAGNOSIS: Same  PROCEDURE: Stereotactic left temporal craniotomy for biopsy of tumor  SURGEON: Dr. Consuella Lose, MD  ASSISTANT: Dr. Duffy Rhody, MD  ANESTHESIA: General Endotracheal  EBL: 100cc  SPECIMENS: Left temporal tumor for permanent pathology  DRAINS: None  COMPLICATIONS: None  CONDITION: Hemodynamically stable to PACU  HISTORY: Christopher Tucker is a 59 y.o. male initially presenting to his primary doctor with intermittent episodes of intermittent right upper and lower extremity numbness.  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  His case was discussed at the multidisciplinary neuro-oncology conference.  Consensus opinion was to proceed with open biopsy of the left temporal lesion.  The risks, benefits, and alternatives were reviewed in detail with the patient and his wife.  After all questions were answered informed consent was obtained and witnessed.  PROCEDURE IN DETAIL: The patient was brought to the operating room. After induction of general anesthesia, the patient was positioned on the operative table in the [place] head holder in the supine position with the left-sided shoulder roll to expose the left temporal scalp. All pressure points were meticulously padded.  Surface markers were then coregistered with the preoperative stereotactic scan until a satisfactory accuracy was achieved.  Stereotactic system was then used to identify the enhancing portion of the tumor in the superior temporal gyrus at the anterior margin of the tumor, furthest away from receptive speech.  Once the planned biopsy location was identified, the stereotactic system was used to Shervin out a skin incision.    After timeout was conducted, the sigmoid shaped temporal skin incision was then infiltrated with  local anesthetic with epinephrine.  Incision was made sharply and carried down through the galea.  Raney clips were applied.  Bovie electrocautery was used to the incise the temporalis muscle and fascia.  Self-retaining retractors were then placed.  The stereotactic system was again used to plan out a small temporal craniotomy to allow access to the superior temporal gyrus and the planned biopsy location.  Bur holes were then created and connected with the craniotome.  A small temporal craniotomy flap was elevated.  Hemostasis was secured with bipolar electrocautery and morselized Gelfoam with thrombin.  The dura was then opened in cruciate fashion.  The stereotactic system was used to confirm the underlying superior temporal gyrus.  The pia was then coagulated with the bipolar and corticectomy was made.  Using a combination of suction, blunt dissection, and the ultrasonic aspirator, portions of the temporal nerve neocortex were resected and sent for permanent pathology.  Stereotactic system was then used to proceed in a posterior trajectory to the underlying enhancing portion of the tumor is identified on the stereotactic scan.  Portions of this were also removed and sent for permanent pathology.  The deeper portions of the tumor were noted to be abnormal, with much more firm consistency then normal white matter.  Having completed the biopsy, the wound was irrigated with normal saline irrigation.  Morselized Gelfoam with thrombin was used to easily achieve hemostasis in the resection cavity.  The wound was again irrigated with normal saline.  Dura was then reapproximated with interrupted 4-0 Nurolon stitches.  A piece of Gelfoam was placed over the dural opening.  The bone flap was then replaced and plated with standard titanium plates and screws.  Temporalis muscle and fascia were reapproximated with 0 Vicryl stitches  and the galea was reapproximated with interrupted 3-0 Vicryl stitches.  Skin was closed with  skin staples.  Bacitracin ointment and sterile dressing was applied.  At the end of the case all sponge, needle, cottonoid, and instrument counts were correct. The patient was then transferred to the stretcher, extubated, and taken to the post-anesthesia care unit in stable hemodynamic condition.

## 2020-04-25 NOTE — Anesthesia Procedure Notes (Signed)
Arterial Line Insertion Start/End8/12/2019 8:20 AM, 04/25/2020 8:26 AM Performed by: CRNA  Preanesthetic checklist: patient identified, IV checked, site marked, risks and benefits discussed, surgical consent, monitors and equipment checked, pre-op evaluation, timeout performed and anesthesia consent Lidocaine 1% used for infiltration Left, radial was placed Catheter size: 20 G Hand hygiene performed  and maximum sterile barriers used   Attempts: 2 Procedure performed without using ultrasound guided technique. Following insertion, Biopatch and dressing applied. Post procedure assessment: normal  Patient tolerated the procedure well with no immediate complications.

## 2020-04-25 NOTE — Transfer of Care (Signed)
Immediate Anesthesia Transfer of Care Note  Patient: Christopher Tucker  Procedure(s) Performed: STEREOTACTIC LEFT TEMPORAL CRANIOTOMY FOR TUMOR (Left Head) APPLICATION OF CRANIAL NAVIGATION (Left Head)  Patient Location: PACU  Anesthesia Type:General  Level of Consciousness: awake, alert  and oriented  Airway & Oxygen Therapy: Patient Spontanous Breathing and Patient connected to nasal cannula oxygen  Post-op Assessment: Report given to RN, Post -op Vital signs reviewed and stable and Patient moving all extremities X 4  Post vital signs: Reviewed and stable  Last Vitals:  Vitals Value Taken Time  BP 136/90 04/25/20 1107  Temp 36.1 C 04/25/20 1107  Pulse 73 04/25/20 1112  Resp 17 04/25/20 1112  SpO2 100 % 04/25/20 1112  Vitals shown include unvalidated device data.  Last Pain:  Vitals:   04/25/20 1107  TempSrc:   PainSc: (P) Asleep      Patients Stated Pain Goal: 3 (51/70/01 [id number])  Complications: No complications documented.

## 2020-04-25 NOTE — H&P (Addendum)
Chief Complaint   Brain tumor  HPI   HPI: Christopher Tucker is a 59 y.o. 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 Episodes have been increasing in frequency, now several times per hour. On the differential includes high grade glioma vs lymphoma. His case was discussed at the multi-disciplinary tumor conference and given location, it was decided open biopsy was the best option vs surgical resection for definitive diagnosis. He presents today for left temporal craniotomy for open biopsy. He is without any concerns.  Patient Active Problem List   Diagnosis Date Noted  . HYPERLIPIDEMIA 07/31/2008  . SCIATICA, ACUTE 07/31/2008    PMH: Past Medical History:  Diagnosis Date  . High cholesterol   . History of kidney stones     PSH: Past Surgical History:  Procedure Laterality Date  . HERNIA REPAIR Left    groin     Medications Prior to Admission  Medication Sig Dispense Refill Last Dose  . ibuprofen (ADVIL) 200 MG tablet Take 400 mg by mouth every 6 (six) hours as needed for headache.   Past Month at Unknown time  . loratadine (CLARITIN) 10 MG tablet Take 10 mg by mouth daily.   04/25/2020 at Unknown time  . simvastatin (ZOCOR) 40 MG tablet Take 40 mg by mouth at bedtime.   04/24/2020 at Unknown time    SH: Social History   Tobacco Use  . Smoking status: Never Smoker  . Smokeless tobacco: Never Used  Substance Use Topics  . Alcohol use: Yes    Alcohol/week: 6.0 standard drinks    Types: 3 Glasses of wine, 3 Cans of beer per week  . Drug use: Not Currently    MEDS: Prior to Admission medications   Medication Sig Start Date End Date Taking? Authorizing Provider  ibuprofen (ADVIL) 200 MG tablet Take 400 mg by mouth every 6 (six) hours as needed for headache.   Yes [provider]  loratadine (CLARITIN) 10 MG tablet Take 10 mg by mouth daily.   Yes [provider]  simvastatin (ZOCOR) 40  MG tablet Take 40 mg by mouth at bedtime. 01/23/20  Yes [provider]    ALLERGY: No Known Allergies  Social History   Tobacco Use  . Smoking status: Never Smoker  . Smokeless tobacco: Never Used  Substance Use Topics  . Alcohol use: Yes    Alcohol/week: 6.0 standard drinks    Types: 3 Glasses of wine, 3 Cans of beer per week     History reviewed. No pertinent family history.   ROS   Review of Systems  All other systems reviewed and are negative.   Exam   Vitals:   04/25/20 0707  BP: (!) 148/88  Pulse: 63  Resp: 18  Temp: 97.7 F (36.5 C)  SpO2: 98%   General appearance: WDWN, NAD Eyes: No scleral injection Cardiovascular: Regular rate and rhythm without murmurs, rubs, gallops. No edema or variciosities. Distal pulses normal. Pulmonary: Effort normal, non-labored breathing Musculoskeletal:     Muscle tone upper extremities: Normal    Muscle tone lower extremities: Normal    Motor exam: Upper Extremities Deltoid Bicep Tricep Grip  Right 5/5 5/5 5/5 5/5  Left 5/5 5/5 5/5 5/5   Lower Extremity IP Quad PF DF EHL  Right 5/5 5/5 5/5 5/5 5/5  Left 5/5 5/5 5/5 5/5 5/5   Neurological Mental Status:    - Patient is awake, alert,  oriented to person, place, month, year, and situation    - Patient is able to give a clear and coherent history.    - No signs of aphasia or neglect Cranial Nerves    - II: Visual Fields are full. PERRL    - III/IV/VI: EOMI without ptosis or diploplia.     - V: Facial sensation is grossly normal    - VII: Facial movement is symmetric.     - VIII: hearing is intact to voice    - X: Uvula elevates symmetrically    - XI: Shoulder shrug is symmetric.    - XII: tongue is midline without atrophy or fasciculations.  Sensory: Sensation grossly intact to LT  Results - Imaging/Labs   Results for orders placed or performed during the hospital encounter of 04/25/20 (from the past 48 hour(s))  Type and screen [place]     Status: None (Preliminary result)   Collection Time: 04/25/20  7:27 AM  Result Value Ref Range   ABO/RH(D) PENDING    Antibody Screen PENDING    Sample Expiration      04/28/2020,2359 Performed at [facility], [place] [address] [place] [address]   CBC     Status: None   Collection Time: 04/25/20  7:50 AM  Result Value Ref Range   WBC 4.8 4.0 - 10.5 K/uL   RBC 5.27 4.22 - 5.81 MIL/uL   Hemoglobin 15.7 13.0 - 17.0 g/dL   HCT 46.5 39 - 52 %   MCV 88.2 80.0 - 100.0 fL   MCH 29.8 26.0 - 34.0 pg   MCHC 33.8 30.0 - 36.0 g/dL   RDW 11.9 11.5 - 15.5 %   Platelets 171 150 - 400 K/uL   nRBC 0.0 0.0 - 0.2 %    Comment: Performed at [facility], [place] [address] [place], [place] [address]    No results found.  IMAGING: MRI brain dated 04/17/2020 was reviewed. This demonstrates a heterogeneously enhancing mass within posterior aspect of the superior temporal gyrus which does extend into the external capsule and posterior limb of the internal capsule. The also appears to be some extension into the insula. There is mild mass effect upon the left trigone.  CT chest/abd/pelvis without evidence of malignancy.  Impression/Plan   59 y.o. 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 Tumor appears to be in a highly eloquent area including receptive speech, posterior limb capsule and optic apparatus and therefore definitive surgical resection nearly impossible without high risk of major functional deficit. His case was discussed at the multi-disciplinary tumor conference and the overall consensus was to proceed with open biopsy for definitive diagnosis. We will proceed with stereotactic left temporal craniotomy for open biopsy.  We have reviewed the indications for surgery, the associated risks, benefits and alternatives at length in the office.  All questions today were answered and consent was  obtained.  Consuella Lose, MD [facility]

## 2020-04-26 ENCOUNTER — Encounter (HOSPITAL_COMMUNITY): Payer: Self-pay | Admitting: Neurosurgery

## 2020-04-26 LAB — BASIC METABOLIC PANEL
Anion gap: 12 (ref 5–15)
BUN: 14 mg/dL (ref 6–20)
CO2: 23 mmol/L (ref 22–32)
Calcium: 9 mg/dL (ref 8.9–10.3)
Chloride: 103 mmol/L (ref 98–111)
Creatinine, Ser: 0.99 mg/dL (ref 0.61–1.24)
GFR calc Af Amer: >60 mL/min (ref >60)
GFR calc non Af Amer: >60 mL/min (ref >60)
Glucose, Bld: 142 mg/dL — ABNORMAL HIGH (ref 70–99)
Potassium: 4.3 mmol/L (ref 3.5–5.1)
Sodium: 138 mmol/L (ref 135–145)

## 2020-04-26 LAB — CBC
HCT: 45.1 % (ref 39.0–52.0)
Hemoglobin: 15.5 g/dL (ref 13.0–17.0)
MCH: 30.3 pg (ref 26.0–34.0)
MCHC: 34.4 g/dL (ref 30.0–36.0)
MCV: 88.1 fL (ref 80.0–100.0)
Platelets: 182 10*3/uL (ref 150–400)
RBC: 5.12 MIL/uL (ref 4.22–5.81)
RDW: 11.9 % (ref 11.5–15.5)
WBC: 13.3 10*3/uL — ABNORMAL HIGH (ref 4.0–10.5)
nRBC: 0 % (ref 0.0–0.2)

## 2020-04-26 MED ORDER — LEVETIRACETAM 500 MG PO TABS
500.0000 mg | ORAL_TABLET | Freq: Two times a day (BID) | ORAL | 1 refills | Status: DC
Start: 2020-04-26 — End: 2020-05-14

## 2020-04-26 MED ORDER — OXYCODONE-ACETAMINOPHEN 7.5-325 MG PO TABS: 1.0000 | ORAL_TABLET | ORAL | 0 refills | Status: DC | PRN

## 2020-04-26 NOTE — Evaluation (Signed)
Physical Therapy Evaluation Patient Details Name: Christopher Tucker MRN: [id number] DOB: 1961-02-27 Today's Date: 04/26/2020   History of Present Illness  59 yo male s/p Stereotactic left temporal craniotomy for biopsy of L temporal lobe tumor on 04/25/20. PMH includes HLD, hernia repair.  Clinical Impression   Pt at baseline level of functioning, with WFL strength, ROM, and mobility. Pt with mild to moderate headache during mobility, Pt educated on decreasing stimulation and slow re-introduction into activity at home. Pt has supportive family, no further acute or post-acute PT needs. Will sign off.      Follow Up Recommendations No PT follow up    Equipment Recommendations  None recommended by PT    Recommendations for Other Services       Precautions / Restrictions Precautions Precautions: None Restrictions Weight Bearing Restrictions: No      Mobility  Bed Mobility Overal bed mobility: Independent                Transfers Overall transfer level: Modified independent               General transfer comment: Increased time to rise and steady, pt being cautious  Ambulation/Gait Ambulation/Gait assistance: Modified independent (Device/Increase time) Gait Distance (Feet): 250 Feet Assistive device: None Gait Pattern/deviations: Step-through pattern;WFL(Within Functional Limits) Gait velocity: WFL, slowed to be cautious   General Gait Details: WFL gait, decreased speed to be cautious. Pt able to turn head L/R, change directions with increased time but no evidence of unsteadiness.  Stairs            Wheelchair Mobility    Modified Rankin (Stroke Patients Only)       Balance Overall balance assessment: No apparent balance deficits (not formally assessed)                                           Pertinent Vitals/Pain Pain Assessment: 0-10 Pain Score: 2  Pain Descriptors / Indicators: Headache Pain Intervention(s): Monitored during  session    Home Living Family/patient expects to be discharged to:: Private residence Living Arrangements: Spouse/significant other Available Help at Discharge: Family;Available 24 hours/day Type of Home: House Home Access: Stairs to enter   [employer] of Steps: 2 Home Layout: Able to live on main level with bedroom/bathroom Home Equipment: None      Prior Function Level of Independence: Independent         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 Enjoys trail running     Hand Dominance   Dominant Hand: Right    Extremity/Trunk Assessment   Upper Extremity Assessment Upper Extremity Assessment: Overall WFL for tasks assessed    Lower Extremity Assessment Lower Extremity Assessment: Overall WFL for tasks assessed    Cervical / Trunk Assessment Cervical / Trunk Assessment: Normal  Communication   Communication: No difficulties  Cognition Arousal/Alertness: Awake/alert Behavior During Therapy: WFL for tasks assessed/performed Overall Cognitive Status: Within Functional Limits for tasks assessed                                 General Comments: Pt able to perform serial 7s subtraction from 100 without difficulty, he was able to follow 4 step command independently, and scored 2/28 on the Short Blessed Test.  He was able to perform path finding while distracted with one error, but  able to problem solve through error and self corrected       General Comments General comments (skin integrity, edema, etc.): wife and son present and have not noticed any cognitive changes     Exercises Other Exercises Other Exercises: PT recommending gradual introduction to normal acitivty, 5x/day up and walking in home. Pt eager to walk outside, PT recommending short bouts walking with wife on level surface. PT also educated pt on decreasing stimulation (light, noise, screen time) as needed and if headache worsens.   Assessment/Plan    PT  Assessment Patent does not need any further PT services  PT Problem List  (n/a)       PT Treatment Interventions  (n/a)    PT Goals (Current goals can be found in the Care Plan section)  Acute Rehab PT Goals Patient Stated Goal: to go home today  PT Goal Formulation: With patient Time For Goal Achievement: 04/26/20 Potential to Achieve Goals: Good    Frequency  (n/a)   Barriers to discharge  (n/a)      Co-evaluation               AM-PAC PT "6 Clicks" Mobility  Outcome Measure Help needed turning from your back to your side while in a flat bed without using bedrails?: None Help needed moving from lying on your back to sitting on the side of a flat bed without using bedrails?: None Help needed moving to and from a bed to a chair (including a wheelchair)?: None Help needed standing up from a chair using your arms (e.g., wheelchair or bedside chair)?: None Help needed to walk in hospital room?: None Help needed climbing 3-5 steps with a railing? : None 6 Click Score: 24    End of Session   Activity Tolerance: Patient tolerated treatment well Patient left: in chair;with call bell/phone within reach Nurse Communication: Mobility status PT Visit Diagnosis: Other abnormalities of gait and mobility (R26.89)    Time: 7544-9201 PT Time Calculation (min) (ACUTE ONLY): 20 min   Charges:   PT Evaluation $PT Eval Low Complexity: 1 Low         Haileyann Staiger E, PT Acute Rehabilitation Services Pager [phone]  Office [phone]  [name] Ciccarelli D Ahsley Attwood 04/26/2020, 11:05 AM

## 2020-04-26 NOTE — Discharge Summary (Signed)
  Physician Discharge Summary  Patient ID: Christopher Tucker MRN: [id number] DOB/AGE: 1961/05/17 59 y.o.  Admit date: 04/25/2020 Discharge date: 04/26/2020  Admission Diagnoses:  Brain tumor  Discharge Diagnoses:  Same Active Problems:   Brain tumor [facility])   Discharged Condition: Stable  Hospital Course:  Christopher Tucker is a 59 y.o. male who was admitted for the below procedure. There were no post operative complications. At time of discharge, pain was well controlled, ambulating with Pt/OT, tolerating po, voiding normal. Ready for discharge.  Treatments: Surgery - crani for tumor biopsy  Discharge Exam: Blood pressure 98/70, pulse (!) 57, temperature 97.6 F (36.4 C), temperature source Oral, resp. rate 14, height 5\' 11"  (1.803 m), weight 86.2 kg, SpO2 97 %. Awake, alert, oriented Speech fluent, appropriate CN grossly intact 5/5 BUE/BLE Wound c/d/i  Disposition: Discharge disposition: 01-Home or Self Care       Discharge Instructions    Call MD for:  difficulty breathing, headache or visual disturbances   Complete by: As directed    Call MD for:  persistant dizziness or light-headedness   Complete by: As directed    Call MD for:  redness, tenderness, or signs of infection (pain, swelling, redness, odor or green/yellow discharge around incision site)   Complete by: As directed    Call MD for:  severe uncontrolled pain   Complete by: As directed    Call MD for:  temperature >100.4   Complete by: As directed    Diet - low sodium heart healthy   Complete by: As directed    Driving Restrictions   Complete by: As directed    Do not drive until given clearance.   Increase activity slowly   Complete by: As directed    Lifting restrictions   Complete by: As directed    Do not lift anything >10lbs. Avoid bending and twisting in awkward positions. Avoid bending at the back.   May shower / Bathe   Complete by: As directed    In 24 hours. Okay to wash wound with warm soapy water.  Avoid scrubbing the wound. Pat dry.   Remove dressing in 24 hours   Complete by: As directed      Allergies as of 04/26/2020   No Known Allergies     Medication List    STOP taking these medications   ibuprofen 200 MG tablet Commonly known as: ADVIL     TAKE these medications   levETIRAcetam 500 MG tablet Commonly known as: Keppra Take 1 tablet (500 mg total) by mouth 2 (two) times daily.   loratadine 10 MG tablet Commonly known as: CLARITIN Take 10 mg by mouth daily.   oxyCODONE-acetaminophen 7.5-325 MG tablet Commonly known as: Percocet Take 1 tablet by mouth every 4 (four) hours as needed.   simvastatin 40 MG tablet Commonly known as: ZOCOR Take 40 mg by mouth at bedtime.       Follow-up Information    Consuella Lose, MD. Schedule an appointment as soon as possible for a visit in 2 week(s).   Specialty: Neurosurgery Contact information: [address] [place] [address] [phone]               Signed: Traci Sermon 04/26/2020, 8:22 AM

## 2020-04-26 NOTE — Evaluation (Signed)
Occupational Therapy Evaluation Patient Details Name: Christopher Tucker MRN: [id number] DOB: 1961-02-16 Today's Date: 04/26/2020    History of Present Illness 59 yo male s/p Stereotactic left temporal craniotomy for biopsy of L temporal lobe tumor on 04/25/20. PMH includes HLD, hernia repair.   Clinical Impression   Patient evaluated by Occupational Therapy with no further acute OT needs identified. All education has been completed and the patient has no further questions. Pt appears to be at baseline and is independent. No cognitive deficits detected.   See below for any follow-up Occupational Therapy or equipment needs. OT is signing off. Thank you for this referral.      Follow Up Recommendations  No OT follow up    Equipment Recommendations  None recommended by OT    Recommendations for Other Services       Precautions / Restrictions Precautions Precautions: None Restrictions Weight Bearing Restrictions: No      Mobility Bed Mobility Overal bed mobility: Independent                Transfers Overall transfer level: Modified independent               General transfer comment: Increased time to rise and steady, pt being cautious    Balance Overall balance assessment: No apparent balance deficits (not formally assessed)                                         ADL either performed or assessed with clinical judgement   ADL Overall ADL's : Independent                                             Vision Baseline Vision/History: Wears glasses Wears Glasses: At all times Patient Visual Report: No change from baseline Vision Assessment?: No apparent visual deficits     Perception Perception Perception Tested?: Yes   Praxis Praxis Praxis tested?: Within functional limits    Pertinent Vitals/Pain Pain Assessment: 0-10 Pain Score: 2  Pain Descriptors / Indicators: Headache Pain Intervention(s): Monitored during session      Hand Dominance Right   Extremity/Trunk Assessment Upper Extremity Assessment Upper Extremity Assessment: Overall WFL for tasks assessed   Lower Extremity Assessment Lower Extremity Assessment: Overall WFL for tasks assessed   Cervical / Trunk Assessment Cervical / Trunk Assessment: Normal   Communication Communication Communication: No difficulties   Cognition Arousal/Alertness: Awake/alert Behavior During Therapy: WFL for tasks assessed/performed Overall Cognitive Status: Within Functional Limits for tasks assessed                                 General Comments: Pt able to perform serial 7s subtraction from 100 without difficulty, he was able to follow 4 step command independently, and scored 2/28 on the Short Blessed Test.  He was able to perform path finding while distracted with one error, but able to problem solve through error and self corrected    General Comments  wife and son present and have not noticed any cognitive changes     Exercises     Shoulder Instructions      Home Living Family/patient expects to be discharged to:: Private residence Living Arrangements: Spouse/significant other Available Help at  Discharge: Family;Available 24 hours/day Type of Home: House Home Access: Stairs to enter [employer] of Steps: 2   Home Layout: Able to live on main level with bedroom/bathroom     Bathroom Shower/Tub: Occupational psychologist: Standard     Home Equipment: None          Prior Functioning/Environment Level of Independence: Independent        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 Enjoys trail running        OT Problem List: Pain      OT Treatment/Interventions:      OT Goals(Current goals can be found in the care plan section) Acute Rehab OT Goals Patient Stated Goal: to go home today  OT Goal Formulation: All assessment and education complete, DC therapy  OT Frequency:      Barriers to D/C:            Co-evaluation              AM-PAC OT "6 Clicks" Daily Activity     Outcome Measure Help from another person eating meals?: None Help from another person taking care of personal grooming?: None Help from another person toileting, which includes using toliet, bedpan, or urinal?: None Help from another person bathing (including washing, rinsing, drying)?: None Help from another person to put on and taking off regular upper body clothing?: None Help from another person to put on and taking off regular lower body clothing?: None 6 Click Score: 24   End of Session Nurse Communication: Mobility status  Activity Tolerance: Patient tolerated treatment well Patient left: in chair;with call bell/phone within reach;with family/visitor present;with nursing/sitter in room  OT Visit Diagnosis: Pain Pain - part of body:  (headache )                Time: 7185-5015 OT Time Calculation (min): 16 min Charges:  OT General Charges $OT Visit: 1 Visit OT Evaluation $OT Eval Low Complexity: 1 Low  Nilsa Nutting., OTR/L Acute Rehabilitation Services Pager [phone] Office [phone]   Lucille Passy M 04/26/2020, 10:12 AM

## 2020-04-26 NOTE — Plan of Care (Signed)
  Problem: Education: Goal: Knowledge of the prescribed therapeutic regimen will improve Outcome: Adequate for Discharge   Problem: Clinical Measurements: Goal: Usual level of consciousness will be regained or maintained. Outcome: Adequate for Discharge Goal: Neurologic status will improve Outcome: Adequate for Discharge Goal: Ability to maintain intracranial pressure will improve Outcome: Adequate for Discharge   Problem: Skin Integrity: Goal: Demonstration of wound healing without infection will improve Outcome: Adequate for Discharge   

## 2020-04-26 NOTE — Progress Notes (Signed)
  NEUROSURGERY PROGRESS NOTE   No issues overnight.  Complains of appropriate HA No new N/T/W  EXAM:  BP 98/70   Pulse (!) 57   Temp 97.6 F (36.4 C) (Oral)   Resp 14   Ht 5\' 11"  (1.803 m)   Wt 86.2 kg   SpO2 97%   BMI 26.50 kg/m   Awake, alert, oriented  Speech fluent, appropriate  CN grossly intact  5/5 BUE/BLE   IMPRESSION/PLAN 59 y.o. male POD1 crani for biopsy. Doing well - d/c home

## 2020-05-03 ENCOUNTER — Other Ambulatory Visit: Payer: Self-pay | Admitting: Radiation Therapy

## 2020-05-07 ENCOUNTER — Inpatient Hospital Stay: Payer: 59

## 2020-05-10 ENCOUNTER — Ambulatory Visit
Admission: RE | Admit: 2020-05-10 | Discharge: 2020-05-10 | Disposition: A | Discharge status: Home / Self Care | Payer: 59 | Source: Ambulatory Visit | Attending: Radiation Oncology | Admitting: Radiation Oncology

## 2020-05-10 ENCOUNTER — Inpatient Hospital Stay (HOSPITAL_BASED_OUTPATIENT_CLINIC_OR_DEPARTMENT_OTHER): Payer: 59 | Admitting: Internal Medicine

## 2020-05-10 ENCOUNTER — Other Ambulatory Visit: Payer: Self-pay

## 2020-05-10 VITALS — BP 131/93 | HR 81 | Temp 97.8°F | Resp 18 | Ht 71.0 in | Wt 187.9 lb

## 2020-05-10 DIAGNOSIS — D496 Neoplasm of unspecified behavior of brain: Secondary | ICD-10-CM

## 2020-05-10 DIAGNOSIS — Z510 Encounter for antineoplastic radiation therapy: Secondary | ICD-10-CM | POA: Insufficient documentation

## 2020-05-11 ENCOUNTER — Ambulatory Visit: Payer: 59 | Admitting: Internal Medicine

## 2020-05-11 ENCOUNTER — Telehealth: Payer: Self-pay | Admitting: Internal Medicine

## 2020-05-11 ENCOUNTER — Other Ambulatory Visit: Payer: Self-pay

## 2020-05-11 ENCOUNTER — Ambulatory Visit
Admission: RE | Admit: 2020-05-11 | Discharge: 2020-05-11 | Disposition: A | Discharge status: Home / Self Care | Payer: 59 | Source: Ambulatory Visit | Attending: Radiation Oncology | Admitting: Radiation Oncology

## 2020-05-11 VITALS — BP 108/80 | HR 72 | Temp 98.2°F | Resp 18 | Ht 71.0 in | Wt 187.6 lb

## 2020-05-11 DIAGNOSIS — D496 Neoplasm of unspecified behavior of brain: Secondary | ICD-10-CM

## 2020-05-11 DIAGNOSIS — Z510 Encounter for antineoplastic radiation therapy: Secondary | ICD-10-CM | POA: Diagnosis not present

## 2020-05-11 MED ORDER — SODIUM CHLORIDE 0.9% FLUSH
10.0000 mL | Freq: Once | INTRAVENOUS | Status: AC
  Administered 2020-05-11: 10 mL via INTRAVENOUS

## 2020-05-11 NOTE — Progress Notes (Signed)
Radiation Oncology         [phone]) [phone] ________________________________  Name: Christopher Tucker        MRN: [id number]  Date of Service: 05/10/2020 DOB: 1961-05-15  GN:[id number], L.Marlou Sa, MD  Consuella Lose, MD     REFERRING PHYSICIAN: Consuella Lose, MD   DIAGNOSIS: The encounter diagnosis was Brain tumor [facility]).   HISTORY OF PRESENT ILLNESS: Christopher Tucker is a 59 y.o. male seen at the request of Dr. Kathyrn Sheriff for a new glioma of the left temporal lobe. The patient originally presented for right-sided numbness and tingling that was intermittent, his episode started increasing in frequency several times even per hour. [facility] at [facility] on 04/17/2020 revealed a poorly marginated mass within the superior left temporal lobe, extending to the left lateral ventricle. The lesion measured 4.9 x 3.3 cm. There was mass-effect on the left ventricle. He was discussed in conference, and underwent a biopsy on 04/25/2020 with Dr. Kathyrn Sheriff, he is still awaiting pathology though the characteristics by imaging and in early discussions with pathology appear to be most consistent with glioblastoma. The patient is seen today to discuss treatment recommendations for his cancer.    PREVIOUS RADIATION THERAPY: No   PAST MEDICAL HISTORY:  Past Medical History:  Diagnosis Date  . High cholesterol   . History of kidney stones        PAST SURGICAL HISTORY: Past Surgical History:  Procedure Laterality Date  . APPLICATION OF CRANIAL NAVIGATION Left 04/25/2020   Procedure: APPLICATION OF CRANIAL NAVIGATION;  Surgeon: Consuella Lose, MD;  Location: [place];  Service: Neurosurgery;  Laterality: Left;  . CRANIOTOMY Left 04/25/2020   Procedure: STEREOTACTIC LEFT TEMPORAL CRANIOTOMY FOR TUMOR;  Surgeon: Consuella Lose, MD;  Location: [place];  Service: Neurosurgery;  Laterality: Left;  . HERNIA REPAIR Left    groin      FAMILY HISTORY: No family history on file.   SOCIAL HISTORY:  reports that he has  never smoked. He has never used smokeless tobacco. He reports current alcohol use of about 6.0 standard drinks of alcohol per week. He reports previous drug use. The patient is married and lives in [place]. He is accompanied by his wife today. 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 He has been able to work remotely at times during the pandemic.  ALLERGIES: Patient has no known allergies.   MEDICATIONS:  Current Outpatient Medications  Medication Sig Dispense Refill  . levETIRAcetam (KEPPRA) 500 MG tablet Take 1 tablet (500 mg total) by mouth 2 (two) times daily. 60 tablet 1  . loratadine (CLARITIN) 10 MG tablet Take 10 mg by mouth daily.    Marland Kitchen oxyCODONE-acetaminophen (PERCOCET) 7.5-325 MG tablet Take 1 tablet by mouth every 4 (four) hours as needed. (Patient not taking: Reported on 05/10/2020) 60 tablet 0  . simvastatin (ZOCOR) 40 MG tablet Take 40 mg by mouth at bedtime.     No current facility-administered medications for this encounter.     REVIEW OF SYSTEMS: On review of systems, the patient reports that he is doing well since his surgical biopsy, he reports that his right-sided numbness has been noticeable again but has not been as persistent as it had previously been. He has been taking Keppra without difficulty, he is not taking any dexamethasone. He denies any significant headaches but notices some fullness along his left scalp incision. No cellulitic streaking or redness has been noted by other providers. No other complaints are verbalized.  PHYSICAL EXAM:  Wt Readings from Last 3 Encounters:  05/10/20 187 lb 14.4 oz (85.2 kg)  04/25/20 190 lb (86.2 kg)   Temp Readings from Last 3 Encounters:  05/10/20 97.8 F (36.6 C) (Tympanic)  04/26/20 98.6 F (37 C) (Oral)   BP Readings from Last 3 Encounters:  05/10/20 (!) 131/93  04/26/20 128/86   Pulse Readings from Last 3 Encounters:  05/10/20 81  04/26/20 74   In general  this is a well appearing Caucasian male in no acute distress. He's alert and oriented x4 and appropriate throughout the examination. Cardiopulmonary assessment is negative for acute distress and he exhibits normal effort. His left scalp incision is well-healed without evidence of cellulitic streaking bleeding or separation. Neurologically he appears to be grossly intact.   ECOG = 1  0 - Asymptomatic (Fully active, able to carry on all predisease activities without restriction)  1 - Symptomatic but completely ambulatory (Restricted in physically strenuous activity but ambulatory and able to carry out work of a light or sedentary nature. For example, light housework, office work)  2 - Symptomatic, <50% in bed during the day (Ambulatory and capable of all self care but unable to carry out any work activities. Up and about more than 50% of waking hours)  3 - Symptomatic, >50% in bed, but not bedbound (Capable of only limited self-care, confined to bed or chair 50% or more of waking hours)  4 - Bedbound (Completely disabled. Cannot carry on any self-care. Totally confined to bed or chair)  5 - Death   Eustace Pen MM, Creech RH, Tormey DC, et al. [phone]). "Toxicity and response criteria of the [facility]". [place] Oncol. 5 (6): 649-55    LABORATORY DATA:  Lab Results  Component Value Date   WBC 13.3 (H) 04/26/2020   HGB 15.5 04/26/2020   HCT 45.1 04/26/2020   MCV 88.1 04/26/2020   PLT 182 04/26/2020   Lab Results  Component Value Date   NA 138 04/26/2020   K 4.3 04/26/2020   CL 103 04/26/2020   CO2 23 04/26/2020   Lab Results  Component Value Date   ALT 22 11/16/2008   AST 18 11/16/2008   ALKPHOS 51 11/16/2008   BILITOT 1.3 (H) 11/16/2008      RADIOGRAPHY: CT CHEST ABDOMEN PELVIS W CONTRAST  Result Date: 04/20/2020 CLINICAL DATA:  Newly diagnosed brain tumor. EXAM: CT CHEST, ABDOMEN, AND PELVIS WITH CONTRAST TECHNIQUE: Multidetector CT imaging of the  chest, abdomen and pelvis was performed following the standard protocol during bolus administration of intravenous contrast. CONTRAST:  100 mL OMNIPAQUE IOHEXOL 300 MG/ML  SOLN COMPARISON:  None. FINDINGS: CT CHEST FINDINGS Cardiovascular: No significant vascular findings. Normal heart size. No pericardial effusion. Mediastinum/Nodes: No enlarged mediastinal, hilar, or axillary lymph nodes. Thyroid gland, trachea, and esophagus demonstrate no significant findings. Lungs/Pleura: Lungs are clear. No pleural effusion or pneumothorax. Musculoskeletal: No chest wall mass or suspicious bone lesions identified. CT ABDOMEN PELVIS FINDINGS Hepatobiliary: Scattered cysts are seen in the liver. The largest is in the right hepatic lobe measuring 4.7 cm in diameter. Some of the lesions are too small to definitively characterize but likely cysts. Gallbladder and biliary tree appear normal. Pancreas: Unremarkable. No pancreatic ductal dilatation or surrounding inflammatory changes. Spleen: Normal in size without focal abnormality. Adrenals/Urinary Tract: The adrenal glands appear normal. A few small renal cysts are present bilaterally. The kidneys are otherwise normal in appearance. Ureters and urinary bladder appear normal. Stomach/Bowel: Stomach is  within normal limits. Appendix appears normal. No evidence of bowel wall thickening, distention, or inflammatory changes. Sigmoid diverticulosis is noted. Vascular/Lymphatic: Aortic atherosclerosis. No enlarged abdominal or pelvic lymph nodes. Reproductive: Prostate is unremarkable. Small right hydrocele is noted. Other: None. Musculoskeletal: No acute or significant osseous findings. IMPRESSION: Negative for primary or metastatic neoplasm in the chest, abdomen or pelvis. No acute abnormality. Sigmoid diverticulosis without diverticulitis. Hepatic and renal cysts. Small right hydrocele is partially imaged. Aortic Atherosclerosis (ICD10-I70.0). Electronically Signed   By: Inge Rise M.D.   On: 04/20/2020 12:52       IMPRESSION/PLAN: 1. Glioblastoma of the left temporal lobe. Dr. Lisbeth Renshaw discusses the imaging findings and reviews her conference discussion about this patient. We are still awaiting confirmation but the anticipated diagnosis is glioblastoma, and we discussed the rationale for chemoradiation as a primary treatment. Patient has met with Dr. [name] this afternoon as well. We are trying to move forward so that he could begin treatment May 21, 2020. We reviewed the risks, benefits, short and long-term effects of radiotherapy as well as delivery and logistics. Dr. Lisbeth Renshaw recommends a course of 6 weeks of radiation, the patient is interested in proceeding, and will return tomorrow for simulation with IV contrast.  In a visit lasting 60 minutes, greater than 50% of the time was spent face to face discussing the patient's condition, in preparation for the discussion, and coordinating the patient's care.   The above documentation reflects my direct findings during this shared patient visit. Please see the separate note by Dr. Lisbeth Renshaw on this date for the remainder of the patient's plan of care.    Carola Rhine, PAC

## 2020-05-11 NOTE — Progress Notes (Signed)
[facility] [place], [place] [address] [phone]   New Patient Evaluation  Date of Service: 05/11/20 Patient Name: Christopher Tucker Patient MRN: [id number] Patient DOB: Mar 19, 1961 Provider: [name], MD  Identifying Statement:  Christopher Tucker is a 59 y.o. male with left temporal brain mass who presents for initial consultation and evaluation.    Referring Provider: Consuella Lose, MD [place]. [address] [employer],  [place] [address]  Oncologic History: 04/25/20: Craniotomy, open biopsy by Dr. Kathyrn Sheriff  Biomarkers:  MGMT Unknown.  IDH 1/2 Unknown.  EGFR Unknown  TERT Unknown   History of Present Illness: The patient's records from the referring physician were obtained and reviewed and the patient interviewed to confirm this HPI.  Christopher Tucker presented to medical attention in early August with several months history of sensory episodes.  They are described as "numbness marching down right arm also involving the leg, lasting for several minutes".  There is no post event weakness or confusion.  No known history of seizures.  In recent weeks he has also experienced some difficulty with expressive language, finding particular words and maintaining high fluency.  Since starting Keppra post-operatively he has not had any recurrence of these sensory events.  He has weaned off dexamethasone.  We are still awaiting finalized pathology results.    Medications: Current Outpatient Medications on File Prior to Visit  Medication Sig Dispense Refill  . levETIRAcetam (KEPPRA) 500 MG tablet Take 1 tablet (500 mg total) by mouth 2 (two) times daily. 60 tablet 1  . loratadine (CLARITIN) 10 MG tablet Take 10 mg by mouth daily.    Marland Kitchen oxyCODONE-acetaminophen (PERCOCET) 7.5-325 MG tablet Take 1 tablet by mouth every 4 (four) hours as needed. (Patient not taking: Reported on 05/10/2020) 60 tablet 0  . simvastatin (ZOCOR) 40 MG tablet Take  40 mg by mouth at bedtime.     No current facility-administered medications on file prior to visit.    Allergies: No Known Allergies Past Medical History:  Past Medical History:  Diagnosis Date  . High cholesterol   . History of kidney stones    Past Surgical History:  Past Surgical History:  Procedure Laterality Date  . APPLICATION OF CRANIAL NAVIGATION Left 04/25/2020   Procedure: APPLICATION OF CRANIAL NAVIGATION;  Surgeon: Consuella Lose, MD;  Location: [place];  Service: Neurosurgery;  Laterality: Left;  . CRANIOTOMY Left 04/25/2020   Procedure: STEREOTACTIC LEFT TEMPORAL CRANIOTOMY FOR TUMOR;  Surgeon: Consuella Lose, MD;  Location: [place];  Service: Neurosurgery;  Laterality: Left;  . HERNIA REPAIR Left    groin    Social History:  Social History   Socioeconomic History  . Marital status: Married    Spouse name: Not on file  . Number of children: Not on file  . Years of education: Not on file  . Highest education level: Not on file  Occupational History  . Not on file  Tobacco Use  . Smoking status: Never Smoker  . Smokeless tobacco: Never Used  Substance and Sexual Activity  . Alcohol use: Yes    Alcohol/week: 6.0 standard drinks    Types: 3 Glasses of wine, 3 Cans of beer per week  . Drug use: Not Currently  . Sexual activity: Not on file  Other Topics Concern  . Not on file  Social History Narrative  . Not on file   Social Determinants of Health   Financial Resource Strain:   .  Difficulty of Paying Living Expenses: Not on file  Food Insecurity:   . Worried About Charity fundraiser in the Last Year: Not on file  . Ran Out of Food in the Last Year: Not on file  Transportation Needs:   . Lack of Transportation (Medical): Not on file  . Lack of Transportation (Non-Medical): Not on file  Physical Activity:   . Days of Exercise per Week: Not on file  . Minutes of Exercise per Session: Not on file  Stress:   . Feeling of Stress : Not on file  Social  Connections:   . Frequency of Communication with Friends and Family: Not on file  . Frequency of Social Gatherings with Friends and Family: Not on file  . Attends Religious Services: Not on file  . Active Member of Clubs or Organizations: Not on file  . Attends Archivist Meetings: Not on file  . Marital Status: Not on file  Intimate Partner Violence:   . Fear of Current or Ex-Partner: Not on file  . Emotionally Abused: Not on file  . Physically Abused: Not on file  . Sexually Abused: Not on file   Family History: No family history on file.  Review of Systems: Constitutional: Doesn't report fevers, chills or abnormal weight loss Eyes: Doesn't report blurriness of vision Ears, nose, mouth, throat, and face: Doesn't report sore throat Respiratory: Doesn't report cough, dyspnea or wheezes Cardiovascular: Doesn't report palpitation, chest discomfort  Gastrointestinal:  Doesn't report nausea, constipation, diarrhea GU: Doesn't report incontinence Skin: Doesn't report skin rashes Neurological: Per HPI Musculoskeletal: Doesn't report joint pain Behavioral/Psych: Doesn't report anxiety  Physical Exam: Vitals:   05/10/20 1348  BP: (!) 131/93  Pulse: 81  Resp: 18  Temp: 97.8 F (36.6 C)  SpO2: 100%   KPS: 90. General: Alert, cooperative, pleasant, in no acute distress Head: Normal EENT: No conjunctival injection or scleral icterus.  Lungs: Resp effort normal Cardiac: Regular rate Abdomen: Non-distended abdomen Skin: No rashes cyanosis or petechiae. Extremities: No clubbing or edema  Neurologic Exam: Mental Status: Awake, alert, attentive to examiner. Oriented to self and environment. Language is fluent with intact comprehension.  Cranial Nerves: Visual acuity is grossly normal. Visual fields are full. Extra-ocular movements intact. No ptosis. Face is symmetric Motor: Tone and bulk are normal. Power is full in both arms and legs. Reflexes are symmetric, no pathologic  reflexes present.  Sensory: Intact to light touch Gait: Normal.   Labs: I have reviewed the data as listed    Component Value Date/Time   NA 138 04/26/2020 0352   K 4.3 04/26/2020 0352   CL 103 04/26/2020 0352   CO2 23 04/26/2020 0352   GLUCOSE 142 (H) 04/26/2020 0352   BUN 14 04/26/2020 0352   CREATININE 0.99 04/26/2020 0352   CALCIUM 9.0 04/26/2020 0352   PROT 7.7 11/16/2008 2130   ALBUMIN 4.9 11/16/2008 2130   AST 18 11/16/2008 2130   ALT 22 11/16/2008 2130   ALKPHOS 51 11/16/2008 2130   BILITOT 1.3 (H) 11/16/2008 2130   GFRNONAA >60 04/26/2020 0352   GFRAA >60 04/26/2020 0352   Lab Results  Component Value Date   WBC 13.3 (H) 04/26/2020   HGB 15.5 04/26/2020   HCT 45.1 04/26/2020   MCV 88.1 04/26/2020   PLT 182 04/26/2020    Imaging:  CT CHEST ABDOMEN PELVIS W CONTRAST  Result Date: 04/20/2020 CLINICAL DATA:  Newly diagnosed brain tumor. EXAM: CT CHEST, ABDOMEN, AND PELVIS WITH CONTRAST TECHNIQUE:  Multidetector CT imaging of the chest, abdomen and pelvis was performed following the standard protocol during bolus administration of intravenous contrast. CONTRAST:  100 mL OMNIPAQUE IOHEXOL 300 MG/ML  SOLN COMPARISON:  None. FINDINGS: CT CHEST FINDINGS Cardiovascular: No significant vascular findings. Normal heart size. No pericardial effusion. Mediastinum/Nodes: No enlarged mediastinal, hilar, or axillary lymph nodes. Thyroid gland, trachea, and esophagus demonstrate no significant findings. Lungs/Pleura: Lungs are clear. No pleural effusion or pneumothorax. Musculoskeletal: No chest wall mass or suspicious bone lesions identified. CT ABDOMEN PELVIS FINDINGS Hepatobiliary: Scattered cysts are seen in the liver. The largest is in the right hepatic lobe measuring 4.7 cm in diameter. Some of the lesions are too small to definitively characterize but likely cysts. Gallbladder and biliary tree appear normal. Pancreas: Unremarkable. No pancreatic ductal dilatation or surrounding  inflammatory changes. Spleen: Normal in size without focal abnormality. Adrenals/Urinary Tract: The adrenal glands appear normal. A few small renal cysts are present bilaterally. The kidneys are otherwise normal in appearance. Ureters and urinary bladder appear normal. Stomach/Bowel: Stomach is within normal limits. Appendix appears normal. No evidence of bowel wall thickening, distention, or inflammatory changes. Sigmoid diverticulosis is noted. Vascular/Lymphatic: Aortic atherosclerosis. No enlarged abdominal or pelvic lymph nodes. Reproductive: Prostate is unremarkable. Small right hydrocele is noted. Other: None. Musculoskeletal: No acute or significant osseous findings. IMPRESSION: Negative for primary or metastatic neoplasm in the chest, abdomen or pelvis. No acute abnormality. Sigmoid diverticulosis without diverticulitis. Hepatic and renal cysts. Small right hydrocele is partially imaged. Aortic Atherosclerosis (ICD10-I70.0). Electronically Signed   By: Inge Rise M.D.   On: 04/20/2020 12:52    Pathology: pending  Assessment/Plan Brain tumor [facility]) [D49.6]  We appreciate the opportunity to participate in the care of Christopher Tucker.  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  We are still awaiting final pathology report which may have been sent out for a second opinion.  We had an extensive conversation with him and his wife regarding suspected pathology, prognosis, and available treatment pathways.    Pending confirmation from pathology, we tenatively recommended proceeding with course of intensity modulated radiation therapy and concurrent daily Temozolomide.  Radiation will be administered Mon-Fri over 6 weeks, Temodar will be dosed at 13m/m2 to be given daily over 42 days.  We reviewed side effects of temodar, including fatigue, nausea/vomiting, constipation, and cytopenias.  Chemotherapy should be held for  the following:  ANC less than 1,000  Platelets less than 100,000  LFT or creatinine greater than 2x ULN  If clinical concerns/contraindications develop  Every 2 weeks during radiation, labs will be checked accompanied by a clinical evaluation in the brain tumor clinic.  Screening for potential clinical trials was performed and discussed using eligibility criteria for [facility] protocols [facility], as well as national database available on [email or web address]    The patient is not a candidate for a research protocol at this time due to patient preference, no pathology results yet.  He should continue Keppra for seizure prevention given likely epileptic nature of his sensory events.  We spent twenty additional minutes teaching regarding the natural history, biology, and historical experience in the treatment of brain tumors. We then discussed in detail the current recommendations for therapy focusing on the mode of administration, mechanism of action, anticipated toxicities, and quality of life issues associated with this plan. We also provided teaching sheets for the patient to take home as an additional resource.  All questions were answered. The patient knows to call the clinic with any problems, questions or concerns. No barriers to learning were detected.  The total time spent in the encounter was 60 minutes and more than 50% was on counseling and review of test results   [name], MD Medical Director of Neuro-Oncology [facility] [place] 05/11/20 11:56 AM

## 2020-05-11 NOTE — Progress Notes (Signed)
Has armband been applied?  Yes  Does patient have an allergy to IV contrast dye?: No   Has patient ever received premedication for IV contrast dye?: n/a  Does patient take metformin?: No  If patient does take metformin when was the last dose: n/a  Date of lab work: 04/26/2020 BUN: 14 CR: 0.99 EGfr: >60  IV site: Left AC  Has IV site been added to flowsheet?  Yes  BP 108/80 (BP Location: Right Arm, Patient Position: Sitting, Cuff Size: Normal)   Pulse 72   Temp 98.2 F (36.8 C)   Resp 18   Ht 5' 11"  (1.803 m)   Wt 187 lb 9.6 oz (85.1 kg)   SpO2 99%   BMI 26.16 kg/m

## 2020-05-11 NOTE — Telephone Encounter (Signed)
No appointments scheduled per 8/19 los. No check out notes provided

## 2020-05-14 ENCOUNTER — Telehealth: Payer: Self-pay | Admitting: Pharmacist

## 2020-05-14 ENCOUNTER — Other Ambulatory Visit: Payer: Self-pay | Admitting: Internal Medicine

## 2020-05-14 DIAGNOSIS — C719 Malignant neoplasm of brain, unspecified: Secondary | ICD-10-CM

## 2020-05-14 LAB — SURGICAL PATHOLOGY

## 2020-05-14 MED ORDER — TEMOZOLOMIDE 140 MG PO CAPS: 140.0000 mg | ORAL_CAPSULE | Freq: Every day | ORAL | 0 refills | Status: DC

## 2020-05-14 MED ORDER — LEVETIRACETAM 500 MG PO TABS: 500.0000 mg | ORAL_TABLET | Freq: Two times a day (BID) | ORAL | 1 refills | Status: DC

## 2020-05-14 MED ORDER — ONDANSETRON HCL 8 MG PO TABS: 8.0000 mg | ORAL_TABLET | Freq: Two times a day (BID) | ORAL | 1 refills | Status: DC | PRN

## 2020-05-14 MED FILL — ONDANSETRON HCL 8 MG TABLET: 8 | 15 days supply | Qty: 30 | Fill #0

## 2020-05-14 NOTE — Progress Notes (Signed)
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

## 2020-05-14 NOTE — Telephone Encounter (Signed)
Oral Oncology Pharmacist Encounter  Received new prescription for Temodar (temozolomide) for the treatment of high-grade anaplastic glioma in conjunction with radiation, planned duration 6 weeks.  Prescription dose and frequency assessed for appropriateness. OK for therapy initiation.   CBC and BMP from 04/26/20 assessed, no relevant lab abnormalities noted.  Current medication list in Epic reviewed, no significant/relevant DDIs with Temodar identified.  Evaluated chart and no patient barriers to medication adherence noted.   Prescription has been e-scribed to the [facility] for benefits analysis and approval.  Oral Oncology Clinic will continue to follow for insurance authorization, copayment issues, initial counseling and start date.  Leron Croak, PharmD, BCPS Hematology/Oncology Clinical Pharmacist [facility] [phone] 05/14/2020 3:02 PM

## 2020-05-15 ENCOUNTER — Encounter (HOSPITAL_COMMUNITY): Payer: Self-pay

## 2020-05-15 ENCOUNTER — Telehealth: Payer: Self-pay

## 2020-05-15 NOTE — Telephone Encounter (Signed)
Oral Oncology Patient Advocate Encounter  Received notification from Christella Scheuermann that prior authorization for Temodar is required.  PA submitted on CoverMyMeds Key [id number] Status is pending  Oral Oncology Clinic will continue to follow.  [place] [facility] [place] Phone [phone] Fax [phone] 05/15/2020 10:26 AM

## 2020-05-16 ENCOUNTER — Telehealth: Payer: Self-pay | Admitting: Internal Medicine

## 2020-05-16 ENCOUNTER — Other Ambulatory Visit: Payer: Self-pay | Admitting: Radiation Therapy

## 2020-05-16 NOTE — Telephone Encounter (Signed)
Scheduled appt per 8/24 sch msg- pt is aware of appt

## 2020-05-17 DIAGNOSIS — Z510 Encounter for antineoplastic radiation therapy: Secondary | ICD-10-CM | POA: Diagnosis not present

## 2020-05-17 MED ORDER — TEMOZOLOMIDE 140 MG PO CAPS: 140.0000 mg | ORAL_CAPSULE | Freq: Every day | ORAL | 0 refills | Status: DC

## 2020-05-17 NOTE — Telephone Encounter (Signed)
Oral Oncology Pharmacist Encounter  Notified by patient's insurance that Temodar (temozolomide) must be filled through [facility]. Rx red[facility].  Leron Croak, PharmD, BCPS Hematology/Oncology Clinical Pharmacist [facility] [phone] 05/17/2020 3:24 PM

## 2020-05-18 NOTE — Telephone Encounter (Signed)
Oral Chemotherapy Pharmacist Encounter  I spoke with patient's wife, Ms. Louischarles, for overview of: Temodar for the treatment of glioblastoma multiforme in conjunction with radiation, planned duration concomitant phase 42 days of therapy.  Patient will likely continue on Temodar for maintenance treatment for 6-12 cycles after completion of concomitant phase.  Counseled on administration, dosing, side effects, monitoring, drug-food interactions, safe handling, storage, and disposal. Discussed with Ms. Liera handling precautions since she will be helping the patient with his medications.  Patient will take Temodar 140mg  capsules, 140 mg total daily dose, by mouth once daily, may take at bedtime and on an empty stomach to decrease nausea and vomiting.  Patient will take Temodar concurrent with radiation for 42 days straight.  Temodar start date: 05/20/20 PM Radiation start date: 05/21/20   Patient will take Zofran 8mg  tablet, 1 tablet by mouth 30-60 min prior to Temodar dose to help decrease N/V once starting adjuvant therapy. Prophylactic Zofran will not be used at initiation of concurrent phase, but will be initiated if nausea develops despite Temodar administration on an empty stomach and at bedtime.   Adverse effects include but are not limited to: nausea, vomiting, anorexia, GI upset, rash, drug fever, and fatigue. Rare but serious adverse effects of pneumocystis pneumonia and secondary malignancy also discussed.  PCP prophylaxis will not be initiated at this time, but may be added based on lymphocyte count in the future.  Reviewed with patient importance of keeping a medication schedule and plan for any missed doses. No barriers to medication adherence identified.  Medication education handout and medication calendar placed in mail for patient and wife.   Medication reconciliation performed and medication/allergy list updated.  Patient's insurance requires medication be filled through  [facility] Rx was redirected to their pharmacy. Patient's wife spoke with representative from their speciality pharmacy and confirmed that medication will be expedited and shipped for delivery to patient on 05/19/20.  All questions answered.  Ms. Ghuman voiced understanding and appreciation.   Patient and wife know to call the office with questions or concerns.  Leron Croak, PharmD, BCPS Hematology/Oncology Clinical Pharmacist [facility] [phone] 05/18/2020 11:22 AM

## 2020-05-18 NOTE — Telephone Encounter (Signed)
Oral Oncology Patient Advocate Encounter  Prior Authorization for Temozolomide has been approved.    PA# [id number] Effective dates: 05/17/20 through 05/17/21  Patient must fill with [facility] will continue to follow.   [facility] [place] Phone [phone] Fax [phone] 05/18/2020 10:34 AM

## 2020-05-21 ENCOUNTER — Telehealth: Payer: Self-pay | Admitting: *Deleted

## 2020-05-21 ENCOUNTER — Other Ambulatory Visit: Payer: Self-pay

## 2020-05-21 ENCOUNTER — Ambulatory Visit: Payer: 59

## 2020-05-21 ENCOUNTER — Other Ambulatory Visit: Payer: Self-pay | Admitting: *Deleted

## 2020-05-21 ENCOUNTER — Ambulatory Visit
Admission: RE | Admit: 2020-05-21 | Discharge: 2020-05-21 | Disposition: A | Discharge status: Home / Self Care | Payer: 59 | Source: Ambulatory Visit | Attending: Radiation Oncology | Admitting: Radiation Oncology

## 2020-05-21 DIAGNOSIS — C719 Malignant neoplasm of brain, unspecified: Secondary | ICD-10-CM

## 2020-05-21 DIAGNOSIS — Z510 Encounter for antineoplastic radiation therapy: Secondary | ICD-10-CM | POA: Diagnosis not present

## 2020-05-21 NOTE — Progress Notes (Signed)
Wife requested Nutrition and social work referrals.  Those were placed.

## 2020-05-21 NOTE — Telephone Encounter (Signed)
Patients wife called with questions and updates.  PCP Dr. Alroy Dust gave patient 1st Shingle shot back in June.  Wanted to know if it was ok for patient to get his 2nd Shingle vaccine during Radiation/Chemo.  States per PCP they can push out 2nd vaccine anywhere between 2-6 months from 1st vaccine.  Also wanted to report was instructed to increase Keppra back at last visit to 750 mg BID to see if it helped with speech concerns.  States that patient is more fatigued, speech is getting worse, not better, slurring, word finding difficulty and word placement issues at times.    Patient was weaned off of steroids prior to his first visit with Dr. [name].   Routed to MD to advise.

## 2020-05-21 NOTE — Telephone Encounter (Signed)
Per Dr. [name], patient needs to follow up with him in office if at all possible this week.    Also patient should postpone his 2nd Shingles vaccination right now as it is a live virus and he is presently on chemotherapy.    Will relay to his wife.

## 2020-05-22 ENCOUNTER — Ambulatory Visit
Admission: RE | Admit: 2020-05-22 | Discharge: 2020-05-22 | Disposition: A | Discharge status: Home / Self Care | Payer: 59 | Source: Ambulatory Visit | Attending: Radiation Oncology | Admitting: Radiation Oncology

## 2020-05-22 ENCOUNTER — Other Ambulatory Visit: Payer: Self-pay

## 2020-05-22 ENCOUNTER — Encounter: Payer: Self-pay | Admitting: *Deleted

## 2020-05-22 ENCOUNTER — Ambulatory Visit: Payer: 59 | Admitting: Radiation Oncology

## 2020-05-22 ENCOUNTER — Ambulatory Visit: Payer: 59

## 2020-05-22 DIAGNOSIS — Z510 Encounter for antineoplastic radiation therapy: Secondary | ICD-10-CM | POA: Diagnosis not present

## 2020-05-22 NOTE — Progress Notes (Signed)
[place] Initial Psychosocial Assessment Clinical Social Work  Clinical Social Work contacted patient by phone to assess psychosocial, emotional, mental health, and spiritual needs of the patient.   Barriers to care:  - Transportation:   o Able to get to appointments: yes, spouse brings patient to all medical appointments at this time.  - Help at home:   o Living Situation: patient lives with spouse, [facility], she works from home and is able to provide strong support system for patient - Support system:   o Level of family support:  Good o Support system includes: Family and Friends/Colleagues - Finances: Patient is currently working and plans to work while able- he has limited his hours and his employer has been [employer]. Ms. Sowash has short-term disability through employer- CSW encouraged patient to call CSW if he has questions or would like to discuss applying for Social security disability. o Employment status: currently employed & source of income: Employment  What is your understanding of where you are with your cancer? Its cause?  Your treatment plan and what happens next? Patient expressed understanding of his cancer diagnosis and understands there are many areas out of his control- patient is coping with side effects, life changes, and loss of control "as it comes, day by day").    What are your hopes and priorities during your treatment? Patient reported his hope is to focus on receiving treatment and having less side effects such as difficulty sleeping and difficulty with speech ("finding his words").  What is important to you? What are your goals for your care?  Mr. Isham reported it is important to him to receive treatment and ensure his family (spouse and adult children) are "okay"- coping adequately with cancer diagnosis and changes. CSW encouraged patient's family to seek supportive adjustment counseling.  If patient or family would like assistance with short-term counseling  by this CSW or community support, our CSW team is happy to arrange.   CSW Summary:  Patient and family psychosocial functioning including strengths, limitations, and coping skills:  Strengths: Ability for insight Average or above average intelligence Capable of independent living General fund of knowledge Motivation for treatment/growth Supportive family/friends Work skills Limitations: Inability to live alone, Inability to drive a car    Identifications of barriers to care: Illness-related problems  Availability of community resources: no, none needed at this time  Clinical Social Worker follow up needed: Yes.   CSW will reach out to patient's spouse to make introduction and provide support as needed.  Maryjean Morn, MSW, LCSW, OSW-C Clinical Social Worker [facility] [phone]

## 2020-05-23 ENCOUNTER — Ambulatory Visit
Admission: RE | Admit: 2020-05-23 | Discharge: 2020-05-23 | Disposition: A | Discharge status: Home / Self Care | Payer: 59 | Source: Ambulatory Visit | Attending: Radiation Oncology | Admitting: Radiation Oncology

## 2020-05-23 ENCOUNTER — Other Ambulatory Visit: Payer: Self-pay

## 2020-05-23 ENCOUNTER — Ambulatory Visit: Payer: 59

## 2020-05-23 DIAGNOSIS — D496 Neoplasm of unspecified behavior of brain: Secondary | ICD-10-CM | POA: Insufficient documentation

## 2020-05-23 DIAGNOSIS — Z510 Encounter for antineoplastic radiation therapy: Secondary | ICD-10-CM | POA: Diagnosis present

## 2020-05-24 ENCOUNTER — Inpatient Hospital Stay: Payer: 59 | Attending: Neurosurgery | Admitting: Internal Medicine

## 2020-05-24 ENCOUNTER — Ambulatory Visit: Payer: 59 | Admitting: Internal Medicine

## 2020-05-24 ENCOUNTER — Ambulatory Visit
Admission: RE | Admit: 2020-05-24 | Discharge: 2020-05-24 | Disposition: A | Discharge status: Home / Self Care | Payer: 59 | Source: Ambulatory Visit | Attending: Radiation Oncology | Admitting: Radiation Oncology

## 2020-05-24 ENCOUNTER — Ambulatory Visit: Payer: 59

## 2020-05-24 VITALS — BP 141/97 | HR 59 | Temp 96.8°F | Resp 20 | Ht 71.0 in | Wt 187.3 lb

## 2020-05-24 DIAGNOSIS — D496 Neoplasm of unspecified behavior of brain: Secondary | ICD-10-CM | POA: Insufficient documentation

## 2020-05-24 DIAGNOSIS — C719 Malignant neoplasm of brain, unspecified: Secondary | ICD-10-CM | POA: Diagnosis not present

## 2020-05-24 DIAGNOSIS — Z510 Encounter for antineoplastic radiation therapy: Secondary | ICD-10-CM | POA: Insufficient documentation

## 2020-05-24 NOTE — Progress Notes (Signed)
Pt here for patient teaching.  Pt given Radiation and You booklet, skin care instructions and Sonafine.  Reviewed areas of pertinence such as fatigue, hair loss, nausea and vomiting, skin changes, headache and blurry vision . Pt able to give teach back of to pat skin and use unscented/gentle soap,apply Sonafine bid and avoid applying anything to skin within 4 hours of treatment. Pt verbalizes understanding of information given and will contact nursing with any questions or concerns.    Stefannie Defeo M. Yareli Carthen RN, BSN      

## 2020-05-24 NOTE — Progress Notes (Signed)
[facility] [address], [place] [address] [phone]   Interval Evaluation  Date of Service: 05/24/20 Patient Name: Christopher Tucker Patient MRN: [id number] Patient DOB: Jul 17, 1961 Provider: [name], MD  Identifying Statement:  Christopher Tucker is a 59 y.o. male with left temporal anaplastic astrocytoma   Oncologic History: 04/25/20: Craniotomy, open biopsy by Dr. Kathyrn Sheriff  Biomarkers:  MGMT Unknown.  IDH 1/2 Wild type.  EGFR Unknown  TERT "Mutated   Interval History:  Christopher Tucker presents today for follow up after clinical changes.  He and his wife describe increased short term memory impairment, and somewhat of a decline in clarity and fluency of speech.  That said, he continues to work full time without significant difficulties.  Otherwise fully functional and independent, no problems with RT or chemo.  Wife also remarks about changes in mood and increased irritability.  H+P (05/10/20) Patient presented to medical attention in early August with several months history of sensory episodes.  They are described as "numbness marching down right arm also involving the leg, lasting for several minutes".  There is no post event weakness or confusion.  No known history of seizures.  In recent weeks he has also experienced some difficulty with expressive language, finding particular words and maintaining high fluency.  Since starting Keppra post-operatively he has not had any recurrence of these sensory events.  He has weaned off dexamethasone.  We are still awaiting finalized pathology results.    Medications: Current Outpatient Medications on File Prior to Visit  Medication Sig Dispense Refill  . levETIRAcetam (KEPPRA) 500 MG tablet Take 1 tablet (500 mg total) by mouth 2 (two) times daily. 60 tablet 1  . loratadine (CLARITIN) 10 MG tablet Take 10 mg by mouth daily.    . ondansetron (ZOFRAN) 8 MG tablet Take 1 tablet (8 mg total) by  mouth 2 (two) times daily as needed (nausea and vomiting). May take 30-60 minutes prior to Temodar administration if nausea/vomiting occurs. 30 tablet 1  . oxyCODONE-acetaminophen (PERCOCET) 7.5-325 MG tablet Take 1 tablet by mouth every 4 (four) hours as needed. (Patient not taking: Reported on 05/10/2020) 60 tablet 0  . simvastatin (ZOCOR) 40 MG tablet Take 40 mg by mouth at bedtime.    . temozolomide (TEMODAR) 140 MG capsule Take 1 capsule (140 mg total) by mouth daily. May take on an empty stomach to decrease nausea & vomiting. 42 capsule 0   No current facility-administered medications on file prior to visit.    Allergies: No Known Allergies Past Medical History:  Past Medical History:  Diagnosis Date  . High cholesterol   . History of kidney stones    Past Surgical History:  Past Surgical History:  Procedure Laterality Date  . APPLICATION OF CRANIAL NAVIGATION Left 04/25/2020   Procedure: APPLICATION OF CRANIAL NAVIGATION;  Surgeon: Consuella Lose, MD;  Location: [facility];  Service: Neurosurgery;  Laterality: Left;  . CRANIOTOMY Left 04/25/2020   Procedure: STEREOTACTIC LEFT TEMPORAL CRANIOTOMY FOR TUMOR;  Surgeon: Consuella Lose, MD;  Location: [facility];  Service: Neurosurgery;  Laterality: Left;  . HERNIA REPAIR Left    groin    Social History:  Social History   Socioeconomic History  . Marital status: Married    Spouse name: Not on file  . Number of children: Not on file  . Years of education: Not on file  . Highest education level: Not on file  Occupational History  . Not on  file  Tobacco Use  . Smoking status: Never Smoker  . Smokeless tobacco: Never Used  Substance and Sexual Activity  . Alcohol use: Yes    Alcohol/week: 6.0 standard drinks    Types: 3 Glasses of wine, 3 Cans of beer per week  . Drug use: Not Currently  . Sexual activity: Not on file  Other Topics Concern  . Not on file  Social History Narrative  . Not on file   Social Determinants of Health    Financial Resource Strain:   . Difficulty of Paying Living Expenses: Not on file  Food Insecurity:   . Worried About Charity fundraiser in the Last Year: Not on file  . Ran Out of Food in the Last Year: Not on file  Transportation Needs:   . Lack of Transportation (Medical): Not on file  . Lack of Transportation (Non-Medical): Not on file  Physical Activity:   . Days of Exercise per Week: Not on file  . Minutes of Exercise per Session: Not on file  Stress:   . Feeling of Stress : Not on file  Social Connections:   . Frequency of Communication with Friends and Family: Not on file  . Frequency of Social Gatherings with Friends and Family: Not on file  . Attends Religious Services: Not on file  . Active Member of Clubs or Organizations: Not on file  . Attends Archivist Meetings: Not on file  . Marital Status: Not on file  Intimate Partner Violence:   . Fear of Current or Ex-Partner: Not on file  . Emotionally Abused: Not on file  . Physically Abused: Not on file  . Sexually Abused: Not on file   Family History: No family history on file.  Review of Systems: Constitutional: Doesn't report fevers, chills or abnormal weight loss Eyes: Doesn't report blurriness of vision Ears, nose, mouth, throat, and face: Doesn't report sore throat Respiratory: Doesn't report cough, dyspnea or wheezes Cardiovascular: Doesn't report palpitation, chest discomfort  Gastrointestinal:  Doesn't report nausea, constipation, diarrhea GU: Doesn't report incontinence Skin: Doesn't report skin rashes Neurological: Per HPI Musculoskeletal: Doesn't report joint pain Behavioral/Psych: Doesn't report anxiety  Physical Exam: Vitals:   05/24/20 1403  BP: (!) 141/97  Pulse: (!) 59  Resp: 20  Temp: (!) 96.8 F (36 C)  SpO2: 100%   KPS: 90. General: Alert, cooperative, pleasant, in no acute distress Head: Normal EENT: No conjunctival injection or scleral icterus.  Lungs: Resp effort  normal Cardiac: Regular rate Abdomen: Non-distended abdomen Skin: No rashes cyanosis or petechiae. Extremities: No clubbing or edema  Neurologic Exam: Mental Status: Awake, alert, attentive to examiner. Oriented to self and environment. Language c/w mild transcortical expressive dyshpasia Cranial Nerves: Visual acuity is grossly normal. Visual fields are full. Extra-ocular movements intact. No ptosis. Face is symmetric Motor: Tone and bulk are normal. Power is full in both arms and legs. Reflexes are symmetric, no pathologic reflexes present.  Sensory: Intact to light touch Gait: Normal.   Labs: I have reviewed the data as listed    Component Value Date/Time   NA 138 04/26/2020 0352   K 4.3 04/26/2020 0352   CL 103 04/26/2020 0352   CO2 23 04/26/2020 0352   GLUCOSE 142 (H) 04/26/2020 0352   BUN 14 04/26/2020 0352   CREATININE 0.99 04/26/2020 0352   CALCIUM 9.0 04/26/2020 0352   PROT 7.7 11/16/2008 2130   ALBUMIN 4.9 11/16/2008 2130   AST 18 11/16/2008 2130   ALT  22 11/16/2008 2130   ALKPHOS 51 11/16/2008 2130   BILITOT 1.3 (H) 11/16/2008 2130   GFRNONAA >60 04/26/2020 0352   GFRAA >60 04/26/2020 0352   Lab Results  Component Value Date   WBC 13.3 (H) 04/26/2020   HGB 15.5 04/26/2020   HCT 45.1 04/26/2020   MCV 88.1 04/26/2020   PLT 182 04/26/2020     Assessment/Plan Anaplastic astrocytoma, 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  Focal changes are quite modest and do not rise to the level of warranting corticosteroids.  For mood lability recommended decreasing Keppra to 588m BID.  If this problem persists will consider switching to alternate mood-stabilizing AED such as Lamictal.  We recommended continuing with course of intensity modulated radiation therapy and concurrent daily Temozolomide.  Radiation will be administered Mon-Fri over 6 weeks, Temodar will be dosed at 721mm2 to be given  daily over 42 days.    Chemotherapy should be held for the following:  ANC less than 1,000  Platelets less than 100,000  LFT or creatinine greater than 2x ULN  If clinical concerns/contraindications develop  Every 2 weeks during radiation, labs will be checked accompanied by a clinical evaluation in the brain tumor clinic.  All questions were answered. The patient knows to call the clinic with any problems, questions or concerns. No barriers to learning were detected.  The total time spent in the encounter was 30 minutes and more than 50% was on counseling and review of test results   ZaVentura SellersMD Medical Director of Neuro-Oncology [facility] [place]9/02/21 2:02 PM

## 2020-05-25 ENCOUNTER — Ambulatory Visit
Admission: RE | Admit: 2020-05-25 | Discharge: 2020-05-25 | Disposition: A | Discharge status: Home / Self Care | Payer: 59 | Source: Ambulatory Visit | Attending: Radiation Oncology | Admitting: Radiation Oncology

## 2020-05-25 ENCOUNTER — Other Ambulatory Visit: Payer: Self-pay

## 2020-05-25 ENCOUNTER — Ambulatory Visit: Payer: 59

## 2020-05-25 DIAGNOSIS — Z510 Encounter for antineoplastic radiation therapy: Secondary | ICD-10-CM | POA: Diagnosis not present

## 2020-05-25 DIAGNOSIS — D496 Neoplasm of unspecified behavior of brain: Secondary | ICD-10-CM

## 2020-05-25 MED ORDER — SONAFINE EX EMUL
1.0000 "application " | Freq: Once | CUTANEOUS | Status: AC
  Administered 2020-05-25: 1 via TOPICAL

## 2020-05-29 ENCOUNTER — Ambulatory Visit
Admission: RE | Admit: 2020-05-29 | Discharge: 2020-05-29 | Disposition: A | Discharge status: Home / Self Care | Payer: 59 | Source: Ambulatory Visit | Attending: Radiation Oncology | Admitting: Radiation Oncology

## 2020-05-29 ENCOUNTER — Ambulatory Visit: Payer: 59

## 2020-05-29 ENCOUNTER — Ambulatory Visit: Payer: 59 | Admitting: Radiation Oncology

## 2020-05-29 ENCOUNTER — Other Ambulatory Visit: Payer: Self-pay

## 2020-05-29 DIAGNOSIS — Z510 Encounter for antineoplastic radiation therapy: Secondary | ICD-10-CM | POA: Diagnosis not present

## 2020-05-29 NOTE — Progress Notes (Signed)
Nutrition Assessment   Reason for Assessment:   Wife with questions regarding nutrition   ASSESSMENT:  59 year old male with left temporal anaplastic astrocytoma.  Patient receiving concurrent radiation and chemotherapy (temozolomide).    Met with patient and wife following radiation.  Patient reports decreased appetite, fatigue, and constipation.  Reports that he is eating less than normally does but is still eating 3 meals per day.  Breakfast is usually cereal, juice or oatmeal or eggs or english muffin with cottage cheese.  Lunch is sandwich and fruit.  Dinner is meat (chicken, salmon) with veggies and starch.    Reports that he typically runs 10 miles per day but now is running about 1 mile due to fatigue and not feeling well.    Medications: reviewed   Labs: reviewed   Anthropometrics:   Height: 71 inches Weight: 187 lb  UBW: stable BMI: 26    NUTRITION DIAGNOSIS: Food and nutrition related knowledge deficit related to cancer diagnosis as evidenced by wife with questions regarding nutrition   INTERVENTION:  Discussed nutrition recommendations and goals of nutrition during treatment.  Encouraged protein source at every meal.   Discussed strategies to help with constipation and nausea.  Handouts for both given to patient.  Questions answered.   Contact information provided   MONITORING, EVALUATION, GOAL: weight trends, intake   Next Visit: no follow-up visit scheduled.  Patient and wife to reach out to RD with questions   B. , RD, LDN Registered Dietitian [phone] (mobile)        

## 2020-05-30 ENCOUNTER — Ambulatory Visit: Payer: 59

## 2020-05-30 ENCOUNTER — Ambulatory Visit
Admission: RE | Admit: 2020-05-30 | Discharge: 2020-05-30 | Disposition: A | Discharge status: Home / Self Care | Payer: 59 | Source: Ambulatory Visit | Attending: Radiation Oncology | Admitting: Radiation Oncology

## 2020-05-30 ENCOUNTER — Other Ambulatory Visit: Payer: Self-pay

## 2020-05-30 DIAGNOSIS — Z510 Encounter for antineoplastic radiation therapy: Secondary | ICD-10-CM | POA: Diagnosis not present

## 2020-05-31 ENCOUNTER — Other Ambulatory Visit: Payer: Self-pay

## 2020-05-31 ENCOUNTER — Ambulatory Visit
Admission: RE | Admit: 2020-05-31 | Discharge: 2020-05-31 | Disposition: A | Discharge status: Home / Self Care | Payer: 59 | Source: Ambulatory Visit | Attending: Radiation Oncology | Admitting: Radiation Oncology

## 2020-05-31 ENCOUNTER — Inpatient Hospital Stay (HOSPITAL_BASED_OUTPATIENT_CLINIC_OR_DEPARTMENT_OTHER): Payer: 59 | Admitting: Internal Medicine

## 2020-05-31 ENCOUNTER — Ambulatory Visit: Payer: 59

## 2020-05-31 ENCOUNTER — Inpatient Hospital Stay: Payer: 59

## 2020-05-31 VITALS — BP 129/97 | HR 65 | Temp 97.5°F | Resp 18 | Ht 71.0 in | Wt 188.7 lb

## 2020-05-31 DIAGNOSIS — C719 Malignant neoplasm of brain, unspecified: Secondary | ICD-10-CM

## 2020-05-31 DIAGNOSIS — Z510 Encounter for antineoplastic radiation therapy: Secondary | ICD-10-CM | POA: Diagnosis not present

## 2020-05-31 LAB — CMP (CANCER CENTER ONLY)
ALT: 34 U/L (ref 0–44)
AST: 22 U/L (ref 15–41)
Albumin: 4.2 g/dL (ref 3.5–5.0)
Alkaline Phosphatase: 58 U/L (ref 38–126)
Anion gap: 9 (ref 5–15)
BUN: 11 mg/dL (ref 6–20)
CO2: 27 mmol/L (ref 22–32)
Calcium: 9.6 mg/dL (ref 8.9–10.3)
Chloride: 109 mmol/L (ref 98–111)
Creatinine: 0.98 mg/dL (ref 0.61–1.24)
GFR, Est AFR Am: >60 mL/min (ref >60)
GFR, Estimated: >60 mL/min (ref >60)
Glucose, Bld: 96 mg/dL (ref 70–99)
Potassium: 4.2 mmol/L (ref 3.5–5.1)
Sodium: 145 mmol/L (ref 135–145)
Total Bilirubin: 1.1 mg/dL (ref 0.3–1.2)
Total Protein: 7.5 g/dL (ref 6.5–8.1)

## 2020-05-31 LAB — CBC WITH DIFFERENTIAL (CANCER CENTER ONLY)
Abs Immature Granulocytes: 0.02 10*3/uL (ref 0.00–0.07)
Basophils Absolute: 0.1 10*3/uL (ref 0.0–0.1)
Basophils Relative: 1 %
Eosinophils Absolute: 0.1 10*3/uL (ref 0.0–0.5)
Eosinophils Relative: 2 %
HCT: 46.2 % (ref 39.0–52.0)
Hemoglobin: 16.2 g/dL (ref 13.0–17.0)
Immature Granulocytes: 0 %
Lymphocytes Relative: 22 %
Lymphs Abs: 1.1 10*3/uL (ref 0.7–4.0)
MCH: 30.2 pg (ref 26.0–34.0)
MCHC: 35.1 g/dL (ref 30.0–36.0)
MCV: 86 fL (ref 80.0–100.0)
Monocytes Absolute: 0.6 10*3/uL (ref 0.1–1.0)
Monocytes Relative: 12 %
Neutro Abs: 3.2 10*3/uL (ref 1.7–7.7)
Neutrophils Relative %: 63 %
Platelet Count: 170 10*3/uL (ref 150–400)
RBC: 5.37 MIL/uL (ref 4.22–5.81)
RDW: 11.9 % (ref 11.5–15.5)
WBC Count: 5.1 10*3/uL (ref 4.0–10.5)
nRBC: 0 % (ref 0.0–0.2)

## 2020-05-31 NOTE — Progress Notes (Signed)
[facility] at [place], [place] [address] [phone]   Interval Evaluation  Date of Service: 05/31/20 Patient Name: Christopher Tucker Patient MRN: [id number] Patient DOB: April 24, 1961 Provider: [name], MD  Identifying Statement:  Christopher Tucker is a 59 y.o. male with left temporal anaplastic astrocytoma   Oncologic History: 04/25/20: Craniotomy, open biopsy by Dr. Kathyrn Sheriff  Biomarkers:  MGMT Unknown.  IDH 1/2 Wild type.  EGFR Unknown  TERT "Mutated   Interval History:  Christopher Tucker presents today for follow up after completing 2 weeks of IMRT and Temodar.  He and his wife describe relatively stability in short term memory and speech, with some days worse than others.  He is unsure at this time about his work future.  Otherwise fully functional and independent, no problems with RT or chemo.  Irritability better on reduced dose of Keppra.  H+P (05/10/20) Patient presented to medical attention in early August with several months history of sensory episodes.  They are described as "numbness marching down right arm also involving the leg, lasting for several minutes".  There is no post event weakness or confusion.  No known history of seizures.  In recent weeks he has also experienced some difficulty with expressive language, finding particular words and maintaining high fluency.  Since starting Keppra post-operatively he has not had any recurrence of these sensory events.  He has weaned off dexamethasone.  We are still awaiting finalized pathology results.    Medications: Current Outpatient Medications on File Prior to Visit  Medication Sig Dispense Refill  . levETIRAcetam (KEPPRA) 500 MG tablet Take 1 tablet (500 mg total) by mouth 2 (two) times daily. 60 tablet 1  . ondansetron (ZOFRAN) 8 MG tablet Take 1 tablet (8 mg total) by mouth 2 (two) times daily as needed (nausea and vomiting). May take 30-60 minutes prior to Temodar  administration if nausea/vomiting occurs. 30 tablet 1  . simvastatin (ZOCOR) 40 MG tablet Take 40 mg by mouth at bedtime.    . temozolomide (TEMODAR) 140 MG capsule Take 1 capsule (140 mg total) by mouth daily. May take on an empty stomach to decrease nausea & vomiting. 42 capsule 0   No current facility-administered medications on file prior to visit.    Allergies: No Known Allergies Past Medical History:  Past Medical History:  Diagnosis Date  . High cholesterol   . History of kidney stones    Past Surgical History:  Past Surgical History:  Procedure Laterality Date  . APPLICATION OF CRANIAL NAVIGATION Left 04/25/2020   Procedure: APPLICATION OF CRANIAL NAVIGATION;  Surgeon: Consuella Lose, MD;  Location: [facility];  Service: Neurosurgery;  Laterality: Left;  . CRANIOTOMY Left 04/25/2020   Procedure: STEREOTACTIC LEFT TEMPORAL CRANIOTOMY FOR TUMOR;  Surgeon: Consuella Lose, MD;  Location: [facility];  Service: Neurosurgery;  Laterality: Left;  . HERNIA REPAIR Left    groin    Social History:  Social History   Socioeconomic History  . Marital status: Married    Spouse name: Not on file  . Number of children: Not on file  . Years of education: Not on file  . Highest education level: Not on file  Occupational History  . Not on file  Tobacco Use  . Smoking status: Never Smoker  . Smokeless tobacco: Never Used  Substance and Sexual Activity  . Alcohol use: Yes    Alcohol/week: 6.0 standard drinks    Types: 3 Glasses of wine, 3 Cans  of beer per week  . Drug use: Not Currently  . Sexual activity: Not on file  Other Topics Concern  . Not on file  Social History Narrative  . Not on file   Social Determinants of Health   Financial Resource Strain:   . Difficulty of Paying Living Expenses: Not on file  Food Insecurity:   . Worried About Charity fundraiser in the Last Year: Not on file  . Ran Out of Food in the Last Year: Not on file  Transportation Needs:   . Lack of  Transportation (Medical): Not on file  . Lack of Transportation (Non-Medical): Not on file  Physical Activity:   . Days of Exercise per Week: Not on file  . Minutes of Exercise per Session: Not on file  Stress:   . Feeling of Stress : Not on file  Social Connections:   . Frequency of Communication with Friends and Family: Not on file  . Frequency of Social Gatherings with Friends and Family: Not on file  . Attends Religious Services: Not on file  . Active Member of Clubs or Organizations: Not on file  . Attends Archivist Meetings: Not on file  . Marital Status: Not on file  Intimate Partner Violence:   . Fear of Current or Ex-Partner: Not on file  . Emotionally Abused: Not on file  . Physically Abused: Not on file  . Sexually Abused: Not on file   Family History: No family history on file.  Review of Systems: Constitutional: Doesn't report fevers, chills or abnormal weight loss Eyes: Doesn't report blurriness of vision Ears, nose, mouth, throat, and face: Doesn't report sore throat Respiratory: Doesn't report cough, dyspnea or wheezes Cardiovascular: Doesn't report palpitation, chest discomfort  Gastrointestinal:  Doesn't report nausea, constipation, diarrhea GU: Doesn't report incontinence Skin: Doesn't report skin rashes Neurological: Per HPI Musculoskeletal: Doesn't report joint pain Behavioral/Psych: Doesn't report anxiety  Physical Exam: Vitals:   05/31/20 0906  BP: (!) 129/97  Pulse: 65  Resp: 18  Temp: (!) 97.5 F (36.4 C)  SpO2: 100%   KPS: 90. General: Alert, cooperative, pleasant, in no acute distress Head: Normal EENT: No conjunctival injection or scleral icterus.  Lungs: Resp effort normal Cardiac: Regular rate Abdomen: Non-distended abdomen Skin: No rashes cyanosis or petechiae. Extremities: No clubbing or edema  Neurologic Exam: Mental Status: Awake, alert, attentive to examiner. Oriented to self and environment. Language c/w mild  transcortical expressive dyshpasia Cranial Nerves: Visual acuity is grossly normal. Visual fields are full. Extra-ocular movements intact. No ptosis. Face is symmetric Motor: Tone and bulk are normal. Power is full in both arms and legs. Reflexes are symmetric, no pathologic reflexes present.  Sensory: Intact to light touch Gait: Normal.   Labs: I have reviewed the data as listed    Component Value Date/Time   NA 138 04/26/2020 0352   K 4.3 04/26/2020 0352   CL 103 04/26/2020 0352   CO2 23 04/26/2020 0352   GLUCOSE 142 (H) 04/26/2020 0352   BUN 14 04/26/2020 0352   CREATININE 0.99 04/26/2020 0352   CALCIUM 9.0 04/26/2020 0352   PROT 7.7 11/16/2008 2130   ALBUMIN 4.9 11/16/2008 2130   AST 18 11/16/2008 2130   ALT 22 11/16/2008 2130   ALKPHOS 51 11/16/2008 2130   BILITOT 1.3 (H) 11/16/2008 2130   GFRNONAA >60 04/26/2020 0352   GFRAA >60 04/26/2020 0352   Lab Results  Component Value Date   WBC 5.1 05/31/2020   NEUTROABS  3.2 05/31/2020   HGB 16.2 05/31/2020   HCT 46.2 05/31/2020   MCV 86.0 05/31/2020   PLT 170 05/31/2020     Assessment/Plan Anaplastic astrocytoma, IDH-wildtype ([place]) [C71.9]  Christopher Tucker is clinically stable today, now having completed 2 weeks of IMRT and 10m/m2 Temozolomide.  We recommended continuing with course of intensity modulated radiation therapy and concurrent daily Temozolomide.  Radiation will be administered Mon-Fri over 6 weeks, Temodar will be dosed at 732mm2 to be given daily over 42 days.    Chemotherapy should be held for the following:  ANC less than 1,000  Platelets less than 100,000  LFT or creatinine greater than 2x ULN  If clinical concerns/contraindications develop  He will return to clinic in 2 weeks with labs for evaluation, or sooner if needed.  All questions were answered. The patient knows to call the clinic with any problems, questions or concerns. No barriers to learning were detected.  The total time spent in  the encounter was 30 minutes and more than 50% was on counseling and review of test results   ZaVentura SellersMD Medical Director of Neuro-Oncology [facility]9/09/21 9:17 AM

## 2020-06-01 ENCOUNTER — Ambulatory Visit
Admission: RE | Admit: 2020-06-01 | Discharge: 2020-06-01 | Disposition: A | Discharge status: Home / Self Care | Payer: 59 | Source: Ambulatory Visit | Attending: Radiation Oncology | Admitting: Radiation Oncology

## 2020-06-01 ENCOUNTER — Telehealth: Payer: Self-pay | Admitting: Hematology

## 2020-06-01 ENCOUNTER — Telehealth: Payer: Self-pay | Admitting: Internal Medicine

## 2020-06-01 ENCOUNTER — Ambulatory Visit: Payer: 59

## 2020-06-01 ENCOUNTER — Other Ambulatory Visit: Payer: Self-pay

## 2020-06-01 DIAGNOSIS — Z510 Encounter for antineoplastic radiation therapy: Secondary | ICD-10-CM | POA: Diagnosis not present

## 2020-06-01 NOTE — Telephone Encounter (Signed)
No 9/9 los

## 2020-06-04 ENCOUNTER — Ambulatory Visit
Admission: RE | Admit: 2020-06-04 | Discharge: 2020-06-04 | Disposition: A | Discharge status: Home / Self Care | Payer: 59 | Source: Ambulatory Visit | Attending: Radiation Oncology | Admitting: Radiation Oncology

## 2020-06-04 ENCOUNTER — Ambulatory Visit: Payer: 59

## 2020-06-04 ENCOUNTER — Other Ambulatory Visit: Payer: Self-pay

## 2020-06-04 DIAGNOSIS — Z510 Encounter for antineoplastic radiation therapy: Secondary | ICD-10-CM | POA: Diagnosis not present

## 2020-06-05 ENCOUNTER — Ambulatory Visit: Payer: 59

## 2020-06-05 ENCOUNTER — Other Ambulatory Visit: Payer: Self-pay

## 2020-06-05 ENCOUNTER — Ambulatory Visit
Admission: RE | Admit: 2020-06-05 | Discharge: 2020-06-05 | Disposition: A | Discharge status: Home / Self Care | Payer: 59 | Source: Ambulatory Visit | Attending: Radiation Oncology | Admitting: Radiation Oncology

## 2020-06-05 DIAGNOSIS — Z510 Encounter for antineoplastic radiation therapy: Secondary | ICD-10-CM | POA: Diagnosis not present

## 2020-06-06 ENCOUNTER — Other Ambulatory Visit: Payer: Self-pay

## 2020-06-06 ENCOUNTER — Ambulatory Visit: Payer: 59

## 2020-06-06 ENCOUNTER — Ambulatory Visit
Admission: RE | Admit: 2020-06-06 | Discharge: 2020-06-06 | Disposition: A | Discharge status: Home / Self Care | Payer: 59 | Source: Ambulatory Visit | Attending: Radiation Oncology | Admitting: Radiation Oncology

## 2020-06-06 DIAGNOSIS — Z510 Encounter for antineoplastic radiation therapy: Secondary | ICD-10-CM | POA: Diagnosis not present

## 2020-06-07 ENCOUNTER — Ambulatory Visit: Payer: 59

## 2020-06-07 ENCOUNTER — Other Ambulatory Visit: Payer: Self-pay

## 2020-06-07 ENCOUNTER — Ambulatory Visit
Admission: RE | Admit: 2020-06-07 | Discharge: 2020-06-07 | Disposition: A | Discharge status: Home / Self Care | Payer: 59 | Source: Ambulatory Visit | Attending: Radiation Oncology | Admitting: Radiation Oncology

## 2020-06-07 DIAGNOSIS — Z510 Encounter for antineoplastic radiation therapy: Secondary | ICD-10-CM | POA: Diagnosis not present

## 2020-06-08 ENCOUNTER — Ambulatory Visit: Payer: 59

## 2020-06-08 ENCOUNTER — Other Ambulatory Visit: Payer: Self-pay

## 2020-06-08 ENCOUNTER — Ambulatory Visit
Admission: RE | Admit: 2020-06-08 | Discharge: 2020-06-08 | Disposition: A | Discharge status: Home / Self Care | Payer: 59 | Source: Ambulatory Visit | Attending: Radiation Oncology | Admitting: Radiation Oncology

## 2020-06-08 DIAGNOSIS — Z510 Encounter for antineoplastic radiation therapy: Secondary | ICD-10-CM | POA: Diagnosis not present

## 2020-06-09 ENCOUNTER — Ambulatory Visit: Payer: 59

## 2020-06-11 ENCOUNTER — Ambulatory Visit: Payer: 59

## 2020-06-11 ENCOUNTER — Ambulatory Visit
Admission: RE | Admit: 2020-06-11 | Discharge: 2020-06-11 | Disposition: A | Discharge status: Home / Self Care | Payer: 59 | Source: Ambulatory Visit | Attending: Radiation Oncology | Admitting: Radiation Oncology

## 2020-06-11 ENCOUNTER — Other Ambulatory Visit: Payer: Self-pay

## 2020-06-11 DIAGNOSIS — Z510 Encounter for antineoplastic radiation therapy: Secondary | ICD-10-CM | POA: Diagnosis not present

## 2020-06-12 ENCOUNTER — Ambulatory Visit: Payer: 59

## 2020-06-12 ENCOUNTER — Ambulatory Visit
Admission: RE | Admit: 2020-06-12 | Discharge: 2020-06-12 | Disposition: A | Discharge status: Home / Self Care | Payer: 59 | Source: Ambulatory Visit | Attending: Radiation Oncology | Admitting: Radiation Oncology

## 2020-06-12 ENCOUNTER — Other Ambulatory Visit: Payer: Self-pay

## 2020-06-12 ENCOUNTER — Inpatient Hospital Stay: Payer: 59 | Admitting: *Deleted

## 2020-06-12 DIAGNOSIS — C719 Malignant neoplasm of brain, unspecified: Secondary | ICD-10-CM

## 2020-06-12 DIAGNOSIS — Z510 Encounter for antineoplastic radiation therapy: Secondary | ICD-10-CM | POA: Diagnosis not present

## 2020-06-12 NOTE — Progress Notes (Signed)
Christopher Tucker  Clinical Social Tucker met with patient and spouse, [name], today for initial counseling session.   Patient shared overview of current treatment plan and side effects. CSW and patient/spouse discussed their priorities, goals, and concerns.  Common themes identified were managing fear of uncertainty, loss of sense of control/maintaining sense of normalcy, healthy communication, and typical adjustment to cancer.  Mr. And Mrs. Spaeth both prioritized goal of open communication within their family- they have two adult children, both married.  They have one young grandchild and one expectant child.   CSW will schedule follow up session with patient's spouse, [place]. CSW will schedule follow up with patient/family as needed.  Gwinda Maine, LCSW  Clinical Social Worker [facility]

## 2020-06-13 ENCOUNTER — Other Ambulatory Visit: Payer: Self-pay

## 2020-06-13 ENCOUNTER — Ambulatory Visit
Admission: RE | Admit: 2020-06-13 | Discharge: 2020-06-13 | Disposition: A | Discharge status: Home / Self Care | Payer: 59 | Source: Ambulatory Visit | Attending: Radiation Oncology | Admitting: Radiation Oncology

## 2020-06-13 ENCOUNTER — Ambulatory Visit: Payer: 59

## 2020-06-13 DIAGNOSIS — Z510 Encounter for antineoplastic radiation therapy: Secondary | ICD-10-CM | POA: Diagnosis not present

## 2020-06-14 ENCOUNTER — Ambulatory Visit: Payer: 59

## 2020-06-14 ENCOUNTER — Ambulatory Visit
Admission: RE | Admit: 2020-06-14 | Discharge: 2020-06-14 | Disposition: A | Discharge status: Home / Self Care | Payer: 59 | Source: Ambulatory Visit | Attending: Radiation Oncology | Admitting: Radiation Oncology

## 2020-06-14 ENCOUNTER — Inpatient Hospital Stay (HOSPITAL_BASED_OUTPATIENT_CLINIC_OR_DEPARTMENT_OTHER): Payer: 59 | Admitting: Internal Medicine

## 2020-06-14 ENCOUNTER — Inpatient Hospital Stay: Payer: 59

## 2020-06-14 ENCOUNTER — Other Ambulatory Visit: Payer: Self-pay

## 2020-06-14 VITALS — BP 133/88 | HR 62 | Temp 98.6°F | Resp 18 | Ht 71.0 in | Wt 189.1 lb

## 2020-06-14 DIAGNOSIS — C719 Malignant neoplasm of brain, unspecified: Secondary | ICD-10-CM | POA: Diagnosis not present

## 2020-06-14 DIAGNOSIS — Z510 Encounter for antineoplastic radiation therapy: Secondary | ICD-10-CM | POA: Diagnosis not present

## 2020-06-14 LAB — CMP (CANCER CENTER ONLY)
ALT: 50 U/L — ABNORMAL HIGH (ref 0–44)
AST: 30 U/L (ref 15–41)
Albumin: 3.8 g/dL (ref 3.5–5.0)
Alkaline Phosphatase: 60 U/L (ref 38–126)
Anion gap: 5 (ref 5–15)
BUN: 14 mg/dL (ref 6–20)
CO2: 27 mmol/L (ref 22–32)
Calcium: 9.2 mg/dL (ref 8.9–10.3)
Chloride: 108 mmol/L (ref 98–111)
Creatinine: 0.95 mg/dL (ref 0.61–1.24)
GFR, Est AFR Am: >60 mL/min (ref >60)
GFR, Estimated: >60 mL/min (ref >60)
Glucose, Bld: 87 mg/dL (ref 70–99)
Potassium: 4.2 mmol/L (ref 3.5–5.1)
Sodium: 140 mmol/L (ref 135–145)
Total Bilirubin: 1 mg/dL (ref 0.3–1.2)
Total Protein: 6.9 g/dL (ref 6.5–8.1)

## 2020-06-14 LAB — CBC WITH DIFFERENTIAL (CANCER CENTER ONLY)
Abs Immature Granulocytes: 0.01 10*3/uL (ref 0.00–0.07)
Basophils Absolute: 0 10*3/uL (ref 0.0–0.1)
Basophils Relative: 1 %
Eosinophils Absolute: 0.2 10*3/uL (ref 0.0–0.5)
Eosinophils Relative: 5 %
HCT: 43.8 % (ref 39.0–52.0)
Hemoglobin: 15.2 g/dL (ref 13.0–17.0)
Immature Granulocytes: 0 %
Lymphocytes Relative: 22 %
Lymphs Abs: 0.8 10*3/uL (ref 0.7–4.0)
MCH: 29.5 pg (ref 26.0–34.0)
MCHC: 34.7 g/dL (ref 30.0–36.0)
MCV: 84.9 fL (ref 80.0–100.0)
Monocytes Absolute: 0.6 10*3/uL (ref 0.1–1.0)
Monocytes Relative: 15 %
Neutro Abs: 2.2 10*3/uL (ref 1.7–7.7)
Neutrophils Relative %: 57 %
Platelet Count: 208 10*3/uL (ref 150–400)
RBC: 5.16 MIL/uL (ref 4.22–5.81)
RDW: 11.9 % (ref 11.5–15.5)
WBC Count: 3.8 10*3/uL — ABNORMAL LOW (ref 4.0–10.5)
nRBC: 0 % (ref 0.0–0.2)

## 2020-06-14 NOTE — Progress Notes (Signed)
[facility] at [address] [place], [place] [address] [phone]   Interval Evaluation  Date of Service: 06/14/20 Patient Name: Christopher Tucker Patient MRN: [id number] Patient DOB: 1961/02/14 Provider: [name], MD  Identifying Statement:  Christopher Tucker is a 59 y.o. male with left temporal anaplastic astrocytoma   Oncologic History: 04/25/20: Craniotomy, open biopsy by Dr. Kathyrn Sheriff  Biomarkers:  MGMT Unknown.  IDH 1/2 Wild type.  EGFR Unknown  TERT "Mutated   Interval History:  Christopher Tucker presents today for follow up after completing 4 weeks of IMRT and Temodar.  He and his wife describe relatively stability in short term memory and speech, with some days worse than others.  He has been back at work part time, without significant issues other than speech impairment and word finding difficulty.  Otherwise fully functional and independent, no problems with RT or chemo.    H+P (05/10/20) Patient presented to medical attention in early August with several months history of sensory episodes.  They are described as "numbness marching down right arm also involving the leg, lasting for several minutes".  There is no post event weakness or confusion.  No known history of seizures.  In recent weeks he has also experienced some difficulty with expressive language, finding particular words and maintaining high fluency.  Since starting Keppra post-operatively he has not had any recurrence of these sensory events.  He has weaned off dexamethasone.  We are still awaiting finalized pathology results.    Medications: Current Outpatient Medications on File Prior to Visit  Medication Sig Dispense Refill  . ibuprofen (ADVIL) 200 MG tablet Take 400 mg by mouth 2 (two) times daily.    Marland Kitchen levETIRAcetam (KEPPRA) 500 MG tablet Take 1 tablet (500 mg total) by mouth 2 (two) times daily. 60 tablet 1  . ondansetron (ZOFRAN) 8 MG tablet Take 1 tablet (8 mg total) by  mouth 2 (two) times daily as needed (nausea and vomiting). May take 30-60 minutes prior to Temodar administration if nausea/vomiting occurs. 30 tablet 1  . SENNA CO 1 tablet by Combination route 2 (two) times daily.    . simvastatin (ZOCOR) 40 MG tablet Take 40 mg by mouth at bedtime.    . temozolomide (TEMODAR) 140 MG capsule Take 1 capsule (140 mg total) by mouth daily. May take on an empty stomach to decrease nausea & vomiting. 42 capsule 0   No current facility-administered medications on file prior to visit.    Allergies: No Known Allergies Past Medical History:  Past Medical History:  Diagnosis Date  . High cholesterol   . History of kidney stones    Past Surgical History:  Past Surgical History:  Procedure Laterality Date  . APPLICATION OF CRANIAL NAVIGATION Left 04/25/2020   Procedure: APPLICATION OF CRANIAL NAVIGATION;  Surgeon: Consuella Lose, MD;  Location: [facility];  Service: Neurosurgery;  Laterality: Left;  . CRANIOTOMY Left 04/25/2020   Procedure: STEREOTACTIC LEFT TEMPORAL CRANIOTOMY FOR TUMOR;  Surgeon: Consuella Lose, MD;  Location: [facility];  Service: Neurosurgery;  Laterality: Left;  . HERNIA REPAIR Left    groin    Social History:  Social History   Socioeconomic History  . Marital status: Married    Spouse name: Not on file  . Number of children: Not on file  . Years of education: Not on file  . Highest education level: Not on file  Occupational History  . Not on file  Tobacco Use  . Smoking  status: Never Smoker  . Smokeless tobacco: Never Used  Substance and Sexual Activity  . Alcohol use: Yes    Alcohol/week: 6.0 standard drinks    Types: 3 Glasses of wine, 3 Cans of beer per week  . Drug use: Not Currently  . Sexual activity: Not on file  Other Topics Concern  . Not on file  Social History Narrative  . Not on file   Social Determinants of Health   Financial Resource Strain:   . Difficulty of Paying Living Expenses: Not on file  Food  Insecurity:   . Worried About Charity fundraiser in the Last Year: Not on file  . Ran Out of Food in the Last Year: Not on file  Transportation Needs:   . Lack of Transportation (Medical): Not on file  . Lack of Transportation (Non-Medical): Not on file  Physical Activity:   . Days of Exercise per Week: Not on file  . Minutes of Exercise per Session: Not on file  Stress:   . Feeling of Stress : Not on file  Social Connections:   . Frequency of Communication with Friends and Family: Not on file  . Frequency of Social Gatherings with Friends and Family: Not on file  . Attends Religious Services: Not on file  . Active Member of Clubs or Organizations: Not on file  . Attends Archivist Meetings: Not on file  . Marital Status: Not on file  Intimate Partner Violence:   . Fear of Current or Ex-Partner: Not on file  . Emotionally Abused: Not on file  . Physically Abused: Not on file  . Sexually Abused: Not on file   Family History: No family history on file.  Review of Systems: Constitutional: Doesn't report fevers, chills or abnormal weight loss Eyes: Doesn't report blurriness of vision Ears, nose, mouth, throat, and face: Doesn't report sore throat Respiratory: Doesn't report cough, dyspnea or wheezes Cardiovascular: Doesn't report palpitation, chest discomfort  Gastrointestinal:  Doesn't report nausea, constipation, diarrhea GU: Doesn't report incontinence Skin: Doesn't report skin rashes Neurological: Per HPI Musculoskeletal: Doesn't report joint pain Behavioral/Psych: Doesn't report anxiety  Physical Exam: Vitals:   06/14/20 0843  BP: 133/88  Pulse: 62  Resp: 18  Temp: 98.6 F (37 C)  SpO2: 99%   KPS: 90. General: Alert, cooperative, pleasant, in no acute distress Head: Normal EENT: No conjunctival injection or scleral icterus.  Lungs: Resp effort normal Cardiac: Regular rate Abdomen: Non-distended abdomen Skin: No rashes cyanosis or  petechiae. Extremities: No clubbing or edema  Neurologic Exam: Mental Status: Awake, alert, attentive to examiner. Oriented to self and environment. Language c/w mild transcortical expressive dyshpasia Cranial Nerves: Visual acuity is grossly normal. Visual fields are full. Extra-ocular movements intact. No ptosis. Face is symmetric Motor: Tone and bulk are normal. Power is full in both arms and legs. Reflexes are symmetric, no pathologic reflexes present.  Sensory: Intact to light touch Gait: Normal.   Labs: I have reviewed the data as listed    Component Value Date/Time   NA 145 05/31/2020 0850   K 4.2 05/31/2020 0850   CL 109 05/31/2020 0850   CO2 27 05/31/2020 0850   GLUCOSE 96 05/31/2020 0850   BUN 11 05/31/2020 0850   CREATININE 0.98 05/31/2020 0850   CALCIUM 9.6 05/31/2020 0850   PROT 7.5 05/31/2020 0850   ALBUMIN 4.2 05/31/2020 0850   AST 22 05/31/2020 0850   ALT 34 05/31/2020 0850   ALKPHOS 58 05/31/2020 0850  BILITOT 1.1 05/31/2020 0850   GFRNONAA >60 05/31/2020 0850   GFRAA >60 05/31/2020 0850   Lab Results  Component Value Date   WBC 3.8 (L) 06/14/2020   NEUTROABS 2.2 06/14/2020   HGB 15.2 06/14/2020   HCT 43.8 06/14/2020   MCV 84.9 06/14/2020   PLT 208 06/14/2020     Assessment/Plan Anaplastic astrocytoma, IDH-wildtype ([facility]) [C71.9]  Amrom Ore is clinically stable today, now having completed 4 weeks of IMRT and 67m/m2 Temozolomide.  Slight changes from day to day with regards to language function may be consistent with mild inflammatory changes from RT, doesn't rise to level of needing corticosteroids.  We recommended continuing with course of intensity modulated radiation therapy and concurrent daily Temozolomide.  Radiation will be administered Mon-Fri over 6 weeks, Temodar will be dosed at 769mm2 to be given daily over 42 days.    Chemotherapy should be held for the following:  ANC less than 1,000  Platelets less than 100,000  LFT or  creatinine greater than 2x ULN  If clinical concerns/contraindications develop  He will return to clinic in 2 weeks with labs for evaluation, or sooner if needed.  All questions were answered. The patient knows to call the clinic with any problems, questions or concerns. No barriers to learning were detected.  The total time spent in the encounter was 30 minutes and more than 50% was on counseling and review of test results   ZaVentura SellersMD Medical Director of Neuro-Oncology [facility]9/23/21 9:02 AM

## 2020-06-15 ENCOUNTER — Ambulatory Visit
Admission: RE | Admit: 2020-06-15 | Discharge: 2020-06-15 | Disposition: A | Discharge status: Home / Self Care | Payer: 59 | Source: Ambulatory Visit | Attending: Radiation Oncology | Admitting: Radiation Oncology

## 2020-06-15 ENCOUNTER — Ambulatory Visit: Payer: 59

## 2020-06-15 DIAGNOSIS — Z510 Encounter for antineoplastic radiation therapy: Secondary | ICD-10-CM | POA: Diagnosis not present

## 2020-06-16 ENCOUNTER — Ambulatory Visit: Payer: 59

## 2020-06-18 ENCOUNTER — Ambulatory Visit
Admission: RE | Admit: 2020-06-18 | Discharge: 2020-06-18 | Disposition: A | Discharge status: Home / Self Care | Payer: 59 | Source: Ambulatory Visit | Attending: Radiation Oncology | Admitting: Radiation Oncology

## 2020-06-18 ENCOUNTER — Other Ambulatory Visit: Payer: Self-pay

## 2020-06-18 ENCOUNTER — Ambulatory Visit: Payer: 59

## 2020-06-18 DIAGNOSIS — Z510 Encounter for antineoplastic radiation therapy: Secondary | ICD-10-CM | POA: Diagnosis not present

## 2020-06-19 ENCOUNTER — Ambulatory Visit
Admission: RE | Admit: 2020-06-19 | Discharge: 2020-06-19 | Disposition: A | Discharge status: Home / Self Care | Payer: 59 | Source: Ambulatory Visit | Attending: Radiation Oncology | Admitting: Radiation Oncology

## 2020-06-19 ENCOUNTER — Other Ambulatory Visit: Payer: Self-pay

## 2020-06-19 ENCOUNTER — Ambulatory Visit: Payer: 59

## 2020-06-19 DIAGNOSIS — Z510 Encounter for antineoplastic radiation therapy: Secondary | ICD-10-CM | POA: Diagnosis not present

## 2020-06-20 ENCOUNTER — Ambulatory Visit: Payer: 59

## 2020-06-20 ENCOUNTER — Ambulatory Visit
Admission: RE | Admit: 2020-06-20 | Discharge: 2020-06-20 | Disposition: A | Discharge status: Home / Self Care | Payer: 59 | Source: Ambulatory Visit | Attending: Radiation Oncology | Admitting: Radiation Oncology

## 2020-06-20 DIAGNOSIS — Z510 Encounter for antineoplastic radiation therapy: Secondary | ICD-10-CM | POA: Diagnosis not present

## 2020-06-21 ENCOUNTER — Ambulatory Visit: Payer: 59

## 2020-06-21 ENCOUNTER — Ambulatory Visit
Admission: RE | Admit: 2020-06-21 | Discharge: 2020-06-21 | Disposition: A | Discharge status: Home / Self Care | Payer: 59 | Source: Ambulatory Visit | Attending: Radiation Oncology | Admitting: Radiation Oncology

## 2020-06-21 DIAGNOSIS — Z510 Encounter for antineoplastic radiation therapy: Secondary | ICD-10-CM | POA: Diagnosis not present

## 2020-06-22 ENCOUNTER — Ambulatory Visit: Payer: 59

## 2020-06-22 ENCOUNTER — Ambulatory Visit
Admission: RE | Admit: 2020-06-22 | Discharge: 2020-06-22 | Disposition: A | Discharge status: Home / Self Care | Payer: 59 | Source: Ambulatory Visit | Attending: Radiation Oncology | Admitting: Radiation Oncology

## 2020-06-22 DIAGNOSIS — D496 Neoplasm of unspecified behavior of brain: Secondary | ICD-10-CM | POA: Diagnosis present

## 2020-06-22 DIAGNOSIS — Z510 Encounter for antineoplastic radiation therapy: Secondary | ICD-10-CM | POA: Diagnosis present

## 2020-06-23 ENCOUNTER — Ambulatory Visit: Payer: 59

## 2020-06-25 ENCOUNTER — Other Ambulatory Visit: Payer: Self-pay

## 2020-06-25 ENCOUNTER — Ambulatory Visit: Payer: 59

## 2020-06-25 ENCOUNTER — Ambulatory Visit
Admission: RE | Admit: 2020-06-25 | Discharge: 2020-06-25 | Disposition: A | Discharge status: Home / Self Care | Payer: 59 | Source: Ambulatory Visit | Attending: Radiation Oncology | Admitting: Radiation Oncology

## 2020-06-25 DIAGNOSIS — Z510 Encounter for antineoplastic radiation therapy: Secondary | ICD-10-CM | POA: Diagnosis not present

## 2020-06-26 ENCOUNTER — Ambulatory Visit: Payer: 59

## 2020-06-26 ENCOUNTER — Ambulatory Visit
Admission: RE | Admit: 2020-06-26 | Discharge: 2020-06-26 | Disposition: A | Discharge status: Home / Self Care | Payer: 59 | Source: Ambulatory Visit | Attending: Radiation Oncology | Admitting: Radiation Oncology

## 2020-06-26 DIAGNOSIS — Z510 Encounter for antineoplastic radiation therapy: Secondary | ICD-10-CM | POA: Diagnosis not present

## 2020-06-27 ENCOUNTER — Ambulatory Visit
Admission: RE | Admit: 2020-06-27 | Discharge: 2020-06-27 | Disposition: A | Discharge status: Home / Self Care | Payer: 59 | Source: Ambulatory Visit | Attending: Radiation Oncology | Admitting: Radiation Oncology

## 2020-06-27 ENCOUNTER — Ambulatory Visit: Payer: 59

## 2020-06-27 DIAGNOSIS — Z510 Encounter for antineoplastic radiation therapy: Secondary | ICD-10-CM | POA: Diagnosis not present

## 2020-06-28 ENCOUNTER — Ambulatory Visit
Admission: RE | Admit: 2020-06-28 | Discharge: 2020-06-28 | Disposition: A | Discharge status: Home / Self Care | Payer: 59 | Source: Ambulatory Visit | Attending: Radiation Oncology | Admitting: Radiation Oncology

## 2020-06-28 ENCOUNTER — Inpatient Hospital Stay: Payer: 59

## 2020-06-28 ENCOUNTER — Ambulatory Visit: Payer: 59

## 2020-06-28 ENCOUNTER — Other Ambulatory Visit: Payer: Self-pay

## 2020-06-28 ENCOUNTER — Inpatient Hospital Stay: Payer: 59 | Attending: Neurosurgery | Admitting: Internal Medicine

## 2020-06-28 VITALS — BP 120/77 | HR 60 | Temp 98.3°F | Resp 19 | Ht 71.0 in | Wt 184.5 lb

## 2020-06-28 DIAGNOSIS — Z510 Encounter for antineoplastic radiation therapy: Secondary | ICD-10-CM | POA: Diagnosis not present

## 2020-06-28 DIAGNOSIS — Z7952 Long term (current) use of systemic steroids: Secondary | ICD-10-CM | POA: Insufficient documentation

## 2020-06-28 DIAGNOSIS — Z23 Encounter for immunization: Secondary | ICD-10-CM | POA: Insufficient documentation

## 2020-06-28 DIAGNOSIS — C719 Malignant neoplasm of brain, unspecified: Secondary | ICD-10-CM | POA: Diagnosis present

## 2020-06-28 LAB — CBC WITH DIFFERENTIAL (CANCER CENTER ONLY)
Abs Immature Granulocytes: 0.01 10*3/uL (ref 0.00–0.07)
Basophils Absolute: 0.1 10*3/uL (ref 0.0–0.1)
Basophils Relative: 1 %
Eosinophils Absolute: 0.2 10*3/uL (ref 0.0–0.5)
Eosinophils Relative: 4 %
HCT: 43.9 % (ref 39.0–52.0)
Hemoglobin: 15.3 g/dL (ref 13.0–17.0)
Immature Granulocytes: 0 %
Lymphocytes Relative: 15 %
Lymphs Abs: 0.7 10*3/uL (ref 0.7–4.0)
MCH: 29.8 pg (ref 26.0–34.0)
MCHC: 34.9 g/dL (ref 30.0–36.0)
MCV: 85.6 fL (ref 80.0–100.0)
Monocytes Absolute: 0.6 10*3/uL (ref 0.1–1.0)
Monocytes Relative: 14 %
Neutro Abs: 3 10*3/uL (ref 1.7–7.7)
Neutrophils Relative %: 66 %
Platelet Count: 171 10*3/uL (ref 150–400)
RBC: 5.13 MIL/uL (ref 4.22–5.81)
RDW: 12.1 % (ref 11.5–15.5)
WBC Count: 4.5 10*3/uL (ref 4.0–10.5)
nRBC: 0 % (ref 0.0–0.2)

## 2020-06-28 LAB — CMP (CANCER CENTER ONLY)
ALT: 39 U/L (ref 0–44)
AST: 24 U/L (ref 15–41)
Albumin: 3.9 g/dL (ref 3.5–5.0)
Alkaline Phosphatase: 51 U/L (ref 38–126)
Anion gap: 4 — ABNORMAL LOW (ref 5–15)
BUN: 21 mg/dL — ABNORMAL HIGH (ref 6–20)
CO2: 28 mmol/L (ref 22–32)
Calcium: 9.6 mg/dL (ref 8.9–10.3)
Chloride: 107 mmol/L (ref 98–111)
Creatinine: 0.9 mg/dL (ref 0.61–1.24)
GFR, Estimated: >60 mL/min (ref >60)
Glucose, Bld: 85 mg/dL (ref 70–99)
Potassium: 4.4 mmol/L (ref 3.5–5.1)
Sodium: 139 mmol/L (ref 135–145)
Total Bilirubin: 1.4 mg/dL — ABNORMAL HIGH (ref 0.3–1.2)
Total Protein: 7 g/dL (ref 6.5–8.1)

## 2020-06-28 MED ORDER — DEXAMETHASONE 4 MG PO TABS: 4.0000 mg | ORAL_TABLET | Freq: Every day | ORAL | 0 refills | Status: DC

## 2020-06-28 NOTE — Progress Notes (Signed)
[facility] [place], Christopher Tucker [phone]   Interval Evaluation  Date of Service: 06/28/20 Patient Name: Christopher Tucker Patient MRN: [id number] Patient DOB: 1961/08/27 Provider: [name], MD  Identifying Statement:  Christopher Tucker is a 59 y.o. male with left temporal anaplastic astrocytoma   Oncologic History: 04/25/20: Craniotomy, open biopsy by Dr. Kathyrn Sheriff  Biomarkers:  MGMT Unknown.  IDH 1/2 Wild type.  EGFR Unknown  TERT "Mutated   Interval History:  Christopher Tucker presents today for follow up after completing 6 weeks of IMRT and Temodar.  He and his wife describe some worsening of speech and language with regards to fluency and accuracy, with again some days worse than others.  Continues to work part time, without significant issues other than speech impairment and word finding difficulty.  Otherwise fully functional and independent, no problems with RT or chemo.    H+P (05/10/20) Patient presented to medical attention in early August with several months history of sensory episodes.  They are described as "numbness marching down right arm also involving the leg, lasting for several minutes".  There is no post event weakness or confusion.  No known history of seizures.  In recent weeks he has also experienced some difficulty with expressive language, finding particular words and maintaining high fluency.  Since starting Keppra post-operatively he has not had any recurrence of these sensory events.  He has weaned off dexamethasone.  We are still awaiting finalized pathology results.    Medications: Current Outpatient Medications on File Prior to Visit  Medication Sig Dispense Refill  . ibuprofen (ADVIL) 200 MG tablet Take 400 mg by mouth 2 (two) times daily.    Marland Kitchen levETIRAcetam (KEPPRA) 500 MG tablet Take 1 tablet (500 mg total) by mouth 2 (two) times daily. 60 tablet 1  . ondansetron (ZOFRAN) 8 MG tablet Take 1  tablet (8 mg total) by mouth 2 (two) times daily as needed (nausea and vomiting). May take 30-60 minutes prior to Temodar administration if nausea/vomiting occurs. 30 tablet 1  . SENNA CO 1 tablet by Combination route 2 (two) times daily.    . simvastatin (ZOCOR) 40 MG tablet Take 40 mg by mouth at bedtime.    . temozolomide (TEMODAR) 140 MG capsule Take 1 capsule (140 mg total) by mouth daily. May take on an empty stomach to decrease nausea & vomiting. 42 capsule 0   No current facility-administered medications on file prior to visit.    Allergies: No Known Allergies Past Medical History:  Past Medical History:  Diagnosis Date  . High cholesterol   . History of kidney stones    Past Surgical History:  Past Surgical History:  Procedure Laterality Date  . APPLICATION OF CRANIAL NAVIGATION Left 04/25/2020   Procedure: APPLICATION OF CRANIAL NAVIGATION;  Surgeon: Consuella Lose, MD;  Location: [facility];  Service: Neurosurgery;  Laterality: Left;  . CRANIOTOMY Left 04/25/2020   Procedure: STEREOTACTIC LEFT TEMPORAL CRANIOTOMY FOR TUMOR;  Surgeon: Consuella Lose, MD;  Location: [facility];  Service: Neurosurgery;  Laterality: Left;  . HERNIA REPAIR Left    groin    Social History:  Social History   Socioeconomic History  . Marital status: Married    Spouse name: Not on file  . Number of children: Not on file  . Years of education: Not on file  . Highest education level: Not on file  Occupational History  . Not on file  Tobacco Use  .  Smoking status: Never Smoker  . Smokeless tobacco: Never Used  Substance and Sexual Activity  . Alcohol use: Yes    Alcohol/week: 6.0 standard drinks    Types: 3 Glasses of wine, 3 Cans of beer per week  . Drug use: Not Currently  . Sexual activity: Not on file  Other Topics Concern  . Not on file  Social History Narrative  . Not on file   Social Determinants of Health   Financial Resource Strain:   . Difficulty of Paying Living Expenses: Not  on file  Food Insecurity:   . Worried About Charity fundraiser in the Last Year: Not on file  . Ran Out of Food in the Last Year: Not on file  Transportation Needs:   . Lack of Transportation (Medical): Not on file  . Lack of Transportation (Non-Medical): Not on file  Physical Activity:   . Days of Exercise per Week: Not on file  . Minutes of Exercise per Session: Not on file  Stress:   . Feeling of Stress : Not on file  Social Connections:   . Frequency of Communication with Friends and Family: Not on file  . Frequency of Social Gatherings with Friends and Family: Not on file  . Attends Religious Services: Not on file  . Active Member of Clubs or Organizations: Not on file  . Attends Archivist Meetings: Not on file  . Marital Status: Not on file  Intimate Partner Violence:   . Fear of Current or Ex-Partner: Not on file  . Emotionally Abused: Not on file  . Physically Abused: Not on file  . Sexually Abused: Not on file   Family History: No family history on file.  Review of Systems: Constitutional: Doesn't report fevers, chills or abnormal weight loss Eyes: Doesn't report blurriness of vision Ears, nose, mouth, throat, and face: Doesn't report sore throat Respiratory: Doesn't report cough, dyspnea or wheezes Cardiovascular: Doesn't report palpitation, chest discomfort  Gastrointestinal:  Doesn't report nausea, constipation, diarrhea GU: Doesn't report incontinence Skin: Doesn't report skin rashes Neurological: Per HPI Musculoskeletal: Doesn't report joint pain Behavioral/Psych: Doesn't report anxiety  Physical Exam: Vitals:   06/28/20 0855  BP: 120/77  Pulse: 60  Resp: 19  Temp: 98.3 F (36.8 C)  SpO2: 100%   KPS: 90. General: Alert, cooperative, pleasant, in no acute distress Head: Normal EENT: No conjunctival injection or scleral icterus.  Lungs: Resp effort normal Cardiac: Regular rate Abdomen: Non-distended abdomen Skin: No rashes cyanosis or  petechiae. Extremities: No clubbing or edema  Neurologic Exam: Mental Status: Awake, alert, attentive to examiner. Oriented to self and environment. Language c/w mild transcortical expressive dyshpasia Cranial Nerves: Visual acuity is grossly normal. Visual fields are full. Extra-ocular movements intact. No ptosis. Face is symmetric Motor: Tone and bulk are normal. Power is full in both arms and legs. Reflexes are symmetric, no pathologic reflexes present.  Sensory: Intact to light touch Gait: Normal.   Labs: I have reviewed the data as listed    Component Value Date/Time   NA 140 06/14/2020 0828   K 4.2 06/14/2020 0828   CL 108 06/14/2020 0828   CO2 27 06/14/2020 0828   GLUCOSE 87 06/14/2020 0828   BUN 14 06/14/2020 0828   CREATININE 0.95 06/14/2020 0828   CALCIUM 9.2 06/14/2020 0828   PROT 6.9 06/14/2020 0828   ALBUMIN 3.8 06/14/2020 0828   AST 30 06/14/2020 0828   ALT 50 (H) 06/14/2020 0828   ALKPHOS 60 06/14/2020 6333  BILITOT 1.0 06/14/2020 0828   GFRNONAA >60 06/14/2020 0828   GFRAA >60 06/14/2020 0828   Lab Results  Component Value Date   WBC 4.5 06/28/2020   NEUTROABS 3.0 06/28/2020   HGB 15.3 06/28/2020   HCT 43.9 06/28/2020   MCV 85.6 06/28/2020   PLT 171 06/28/2020     Assessment/Plan Anaplastic astrocytoma, IDH-wildtype ([place]) [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    Because of degree of progression with expressive language compared to several weeks prior, recommended short course of dexamethasone, 449mdaily, to treat suspected radio-inflammatory process.  We otherwise recommended completing course of intensity modulated radiation therapy and concurrent daily Temozolomide.  Radiation will be administered Mon-Fri over 6 weeks, Temodar will be dosed at 7564m2 to be given daily over 42 days.    Chemotherapy should be held for the following:  ANC less than 1,000  Platelets less  than 100,000  LFT or creatinine greater than 2x ULN  If clinical concerns/contraindications develop  We ask that Christopher Tucker to clinic in 1 months following next brain MRI, or sooner as needed.  All questions were answered. The patient knows to call the clinic with any problems, questions or concerns. No barriers to learning were detected.  I have spent a total of 30 minutes of face-to-face and non-face-to-face time, excluding clinical staff time, preparing to see patient, ordering tests and/or medications, counseling the patient, and independently interpreting results and communicating results to the patient/family/caregiver    ZacVentura SellersD Medical Director of Neuro-Oncology [facility] [place]/07/21 8:58 AM

## 2020-06-29 ENCOUNTER — Telehealth: Payer: Self-pay | Admitting: Medical Oncology

## 2020-06-29 ENCOUNTER — Telehealth: Payer: Self-pay | Admitting: Internal Medicine

## 2020-06-29 ENCOUNTER — Ambulatory Visit: Payer: 59

## 2020-06-29 ENCOUNTER — Other Ambulatory Visit: Payer: Self-pay

## 2020-06-29 ENCOUNTER — Ambulatory Visit
Admission: RE | Admit: 2020-06-29 | Discharge: 2020-06-29 | Disposition: A | Discharge status: Home / Self Care | Payer: 59 | Source: Ambulatory Visit | Attending: Radiation Oncology | Admitting: Radiation Oncology

## 2020-06-29 DIAGNOSIS — Z510 Encounter for antineoplastic radiation therapy: Secondary | ICD-10-CM | POA: Diagnosis not present

## 2020-06-29 NOTE — Telephone Encounter (Signed)
"  Canker sore-" Can he take OTC benzocaine 20% oral analgesic for canker sore inside lip?   It appeared after "he bit his lip". I  told Christopher Tucker it is okay to use it.   Flu vaccine- requests appt for flu vaccine on Monday -schedule message sent.

## 2020-06-29 NOTE — Telephone Encounter (Signed)
Scheduled per sch msg. Called and left msg  

## 2020-06-29 NOTE — Telephone Encounter (Signed)
Scheduled per 10/7 los. Spoke with pt's wife and is aware of appt time and date.

## 2020-06-30 ENCOUNTER — Ambulatory Visit: Payer: 59

## 2020-07-02 ENCOUNTER — Inpatient Hospital Stay: Payer: 59

## 2020-07-02 ENCOUNTER — Ambulatory Visit
Admission: RE | Admit: 2020-07-02 | Discharge: 2020-07-02 | Disposition: A | Discharge status: Home / Self Care | Payer: 59 | Source: Ambulatory Visit | Attending: Radiation Oncology | Admitting: Radiation Oncology

## 2020-07-02 ENCOUNTER — Encounter: Payer: Self-pay | Admitting: Radiation Oncology

## 2020-07-02 ENCOUNTER — Ambulatory Visit: Payer: 59

## 2020-07-02 ENCOUNTER — Other Ambulatory Visit: Payer: Self-pay

## 2020-07-02 DIAGNOSIS — Z23 Encounter for immunization: Secondary | ICD-10-CM

## 2020-07-02 DIAGNOSIS — D496 Neoplasm of unspecified behavior of brain: Secondary | ICD-10-CM

## 2020-07-02 DIAGNOSIS — C719 Malignant neoplasm of brain, unspecified: Secondary | ICD-10-CM | POA: Diagnosis not present

## 2020-07-02 MED ORDER — INFLUENZA VAC SPLIT QUAD 0.5 ML IM SUSY
PREFILLED_SYRINGE | INTRAMUSCULAR | Status: AC
  Filled 2020-07-02: qty 0.5

## 2020-07-02 MED ORDER — INFLUENZA VAC SPLIT QUAD 0.5 ML IM SUSY
0.5000 mL | PREFILLED_SYRINGE | Freq: Once | INTRAMUSCULAR | Status: AC
  Administered 2020-07-02: 0.5 mL via INTRAMUSCULAR

## 2020-07-03 ENCOUNTER — Ambulatory Visit: Payer: 59

## 2020-07-04 ENCOUNTER — Ambulatory Visit: Payer: 59

## 2020-07-05 ENCOUNTER — Ambulatory Visit: Payer: 59

## 2020-07-06 ENCOUNTER — Telehealth: Payer: Self-pay | Admitting: *Deleted

## 2020-07-06 ENCOUNTER — Ambulatory Visit: Payer: 59

## 2020-07-06 NOTE — Telephone Encounter (Signed)
Received call back from [place]. She states that [employer] speech and memory has not improved with the 5 days of Decadron. She states his hands are a bit shaky at times.  Per Dr. [name], pt can stop the decadron as it does not appear that it has helped.  Holly voiced understanding and knows to call with any questions or concerns or changes. In [place].

## 2020-07-06 NOTE — Telephone Encounter (Signed)
Received vm message from pt's wife Christopher Tucker regarding how Christopher Tucker is doing while on the steroids. She states today is # 5 of the steroids. She wanted to report on how he is doing. Attempted call back-no answer but was able to leave vm message for her to return my call 2 [phone]

## 2020-07-09 ENCOUNTER — Ambulatory Visit: Payer: 59

## 2020-07-16 ENCOUNTER — Other Ambulatory Visit: Payer: Self-pay | Admitting: Internal Medicine

## 2020-07-16 ENCOUNTER — Telehealth: Payer: Self-pay | Admitting: *Deleted

## 2020-07-16 MED ORDER — DEXAMETHASONE 4 MG PO TABS: 4.0000 mg | ORAL_TABLET | Freq: Two times a day (BID) | ORAL | 0 refills | Status: DC

## 2020-07-16 NOTE — Telephone Encounter (Signed)
Patients wife called to advise that patient has been having clinical changes over the past couple of days.  Reports patient has begun sleeping more throughout the day as well as the night time.  He is also having right sided weakness of upper and lower extremities with listing to one side.  Reports having sensitivity to touch on right side as well.  Speech has worsened and finding words has become difficult.    States they are out of town and will be coming back this evening.    Per Dr. [name] need to challenge patient with Decadron 4 mg BID and the a couple of days later assess in the office and if needed we will consider moving up the scheduled MRI if steroids are ineffective.    Wife made aware and denies having any further questions.

## 2020-07-17 ENCOUNTER — Other Ambulatory Visit: Payer: Self-pay | Admitting: Radiation Therapy

## 2020-07-20 ENCOUNTER — Other Ambulatory Visit: Payer: Self-pay

## 2020-07-20 ENCOUNTER — Encounter: Payer: Self-pay | Admitting: Internal Medicine

## 2020-07-20 ENCOUNTER — Inpatient Hospital Stay (HOSPITAL_BASED_OUTPATIENT_CLINIC_OR_DEPARTMENT_OTHER): Payer: 59 | Admitting: Internal Medicine

## 2020-07-20 VITALS — BP 147/98 | HR 62 | Temp 97.1°F | Resp 18 | Ht 71.0 in | Wt 183.8 lb

## 2020-07-20 DIAGNOSIS — C719 Malignant neoplasm of brain, unspecified: Secondary | ICD-10-CM

## 2020-07-20 NOTE — Progress Notes (Signed)
[facility] at [place], [place] [id number] [phone]   Interval Evaluation  Date of Service: 07/20/20 Patient Name: Franchot Pollitt Patient MRN: [id number] Patient DOB: December 15, 1960 Provider: [name], MD  Identifying Statement:  Tramain Gershman is a 59 y.o. male with left temporal anaplastic astrocytoma   Oncologic History: 04/25/20: Craniotomy, open biopsy by Dr. Kathyrn Sheriff  Biomarkers:  MGMT Unknown.  IDH 1/2 Wild type.  EGFR Unknown  TERT "Mutated   Interval History:  Kadir Azucena presents today for follow up after recent clinical changes.  At end of last week and weekend he experienced progressive speech impairment and right sided weakness.  Per family, he began to "shuffle" rather than walk normally because of impaired strength and coordination with right side.  Ongoing language issues were clearly more pronounced.  On 07/31/2023 he started dosing decadron 66m twice per day, which has led to return to prior baseline (before last week).  He feels significantly improved although he still has deficits mainly with speech experienced prior.  No new or progressive changes.  Continues to work every weekday.    H+P (05/10/20) Patient presented to medical attention in early August with several months history of sensory episodes.  They are described as "numbness marching down right arm also involving the leg, lasting for several minutes".  There is no post event weakness or confusion.  No known history of seizures.  In recent weeks he has also experienced some difficulty with expressive language, finding particular words and maintaining high fluency.  Since starting Keppra post-operatively he has not had any recurrence of these sensory events.  He has weaned off dexamethasone.  We are still awaiting finalized pathology results.    Medications: Current Outpatient Medications on File Prior to Visit  Medication Sig Dispense Refill  .  dexamethasone (DECADRON) 4 MG tablet Take 1 tablet (4 mg total) by mouth daily. 30 tablet 0  . dexamethasone (DECADRON) 4 MG tablet Take 1 tablet (4 mg total) by mouth 2 (two) times daily with a meal. 30 tablet 0  . ibuprofen (ADVIL) 200 MG tablet Take 400 mg by mouth 2 (two) times daily.    .Marland KitchenlevETIRAcetam (KEPPRA) 500 MG tablet Take 1 tablet (500 mg total) by mouth 2 (two) times daily. 60 tablet 1  . ondansetron (ZOFRAN) 8 MG tablet Take 1 tablet (8 mg total) by mouth 2 (two) times daily as needed (nausea and vomiting). May take 30-60 minutes prior to Temodar administration if nausea/vomiting occurs. 30 tablet 1  . SENNA CO 1 tablet by Combination route 2 (two) times daily.    . simvastatin (ZOCOR) 40 MG tablet Take 40 mg by mouth at bedtime.    . temozolomide (TEMODAR) 140 MG capsule Take 1 capsule (140 mg total) by mouth daily. May take on an empty stomach to decrease nausea & vomiting. 42 capsule 0   No current facility-administered medications on file prior to visit.    Allergies: No Known Allergies Past Medical History:  Past Medical History:  Diagnosis Date  . High cholesterol   . History of kidney stones    Past Surgical History:  Past Surgical History:  Procedure Laterality Date  . APPLICATION OF CRANIAL NAVIGATION Left 04/25/2020   Procedure: APPLICATION OF CRANIAL NAVIGATION;  Surgeon: NConsuella Lose MD;  [place] [facility]  Service: Neurosurgery;  Laterality: Left;  . CRANIOTOMY Left 04/25/2020   Procedure: STEREOTACTIC LEFT TEMPORAL CRANIOTOMY FOR TUMOR;  Surgeon: NConsuella Lose  MD;  Location: [facility];  Service: Neurosurgery;  Laterality: Left;  . HERNIA REPAIR Left    groin    Social History:  Social History   Socioeconomic History  . Marital status: Married    Spouse name: Not on file  . Number of children: Not on file  . Years of education: Not on file  . Highest education level: Not on file  Occupational History  . Not on file  Tobacco Use  . Smoking  status: Never Smoker  . Smokeless tobacco: Never Used  Substance and Sexual Activity  . Alcohol use: Yes    Alcohol/week: 6.0 standard drinks    Types: 3 Glasses of wine, 3 Cans of beer per week  . Drug use: Not Currently  . Sexual activity: Not on file  Other Topics Concern  . Not on file  Social History Narrative  . Not on file   Social Determinants of Health   Financial Resource Strain:   . Difficulty of Paying Living Expenses: Not on file  Food Insecurity:   . Worried About Charity fundraiser in the Last Year: Not on file  . Ran Out of Food in the Last Year: Not on file  Transportation Needs:   . Lack of Transportation (Medical): Not on file  . Lack of Transportation (Non-Medical): Not on file  Physical Activity:   . Days of Exercise per Week: Not on file  . Minutes of Exercise per Session: Not on file  Stress:   . Feeling of Stress : Not on file  Social Connections:   . Frequency of Communication with Friends and Family: Not on file  . Frequency of Social Gatherings with Friends and Family: Not on file  . Attends Religious Services: Not on file  . Active Member of Clubs or Organizations: Not on file  . Attends Archivist Meetings: Not on file  . Marital Status: Not on file  Intimate Partner Violence:   . Fear of Current or Ex-Partner: Not on file  . Emotionally Abused: Not on file  . Physically Abused: Not on file  . Sexually Abused: Not on file   Family History: No family history on file.  Review of Systems: Constitutional: Doesn't report fevers, chills or abnormal weight loss Eyes: Doesn't report blurriness of vision Ears, nose, mouth, throat, and face: Doesn't report sore throat Respiratory: Doesn't report cough, dyspnea or wheezes Cardiovascular: Doesn't report palpitation, chest discomfort  Gastrointestinal:  Doesn't report nausea, constipation, diarrhea GU: Doesn't report incontinence Skin: Doesn't report skin rashes Neurological: Per  HPI Musculoskeletal: Doesn't report joint pain Behavioral/Psych: Doesn't report anxiety  Physical Exam: Vitals:   07/20/20 1104  BP: (!) 147/98  Pulse: 62  Resp: 18  Temp: (!) 97.1 F (36.2 C)  SpO2: 100%   KPS: 90. General: Alert, cooperative, pleasant, in no acute distress Head: Normal EENT: No conjunctival injection or scleral icterus.  Lungs: Resp effort normal Cardiac: Regular rate Abdomen: Non-distended abdomen Skin: No rashes cyanosis or petechiae. Extremities: No clubbing or edema  Neurologic Exam: Mental Status: Awake, alert, attentive to examiner. Oriented to self and environment. Language c/w mild transcortical expressive dyshpasia Cranial Nerves: Visual acuity is grossly normal. Visual fields are full. Extra-ocular movements intact. No ptosis. Face is symmetric Motor: Tone and bulk are normal. Power is full in both arms and legs. Reflexes are symmetric, no pathologic reflexes present.  Sensory: Intact to light touch Gait: Normal.   Labs: I have reviewed the data as listed  Component Value Date/Time   NA 139 06/28/2020 0836   K 4.4 06/28/2020 0836   CL 107 06/28/2020 0836   CO2 28 06/28/2020 0836   GLUCOSE 85 06/28/2020 0836   BUN 21 (H) 06/28/2020 0836   CREATININE 0.90 06/28/2020 0836   CALCIUM 9.6 06/28/2020 0836   PROT 7.0 06/28/2020 0836   ALBUMIN 3.9 06/28/2020 0836   AST 24 06/28/2020 0836   ALT 39 06/28/2020 0836   ALKPHOS 51 06/28/2020 0836   BILITOT 1.4 (H) 06/28/2020 0836   GFRNONAA >60 06/28/2020 0836   GFRAA >60 06/14/2020 0828   Lab Results  Component Value Date   WBC 4.5 06/28/2020   NEUTROABS 3.0 06/28/2020   HGB 15.3 06/28/2020   HCT 43.9 06/28/2020   MCV 85.6 06/28/2020   PLT 171 06/28/2020     Assessment/Plan Anaplastic astrocytoma, 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  We strongly suspect radio-inflammatory process as etiology based on clinical  response.  We recommended decreasing decadron to 59m/2mg starting Monday 11/1.  On Friday 11/5, if feeling ok, would like him to decrease further to 418mdaily.  MRI scheduled for 07/27/20, will not change that.  We ask that MaBobbie Vallettaeturn to clinic on 11/8 following post-MRI tumor board discussion.  All questions were answered. The patient knows to call the clinic with any problems, questions or concerns. No barriers to learning were detected.  I have spent a total of 30 minutes of face-to-face and non-face-to-face time, excluding clinical staff time, preparing to see patient, ordering tests and/or medications, counseling the patient, and independently interpreting results and communicating results to the patient/family/caregiver    ZaVentura SellersMD Medical Director of Neuro-Oncology [facility]0/29/21 11:09 AM

## 2020-07-23 ENCOUNTER — Telehealth: Payer: Self-pay | Admitting: Internal Medicine

## 2020-07-23 NOTE — Telephone Encounter (Signed)
No 10/29 los 

## 2020-07-27 ENCOUNTER — Ambulatory Visit
Admission: RE | Admit: 2020-07-27 | Discharge: 2020-07-27 | Disposition: A | Discharge status: Home / Self Care | Payer: 59 | Source: Ambulatory Visit | Attending: Internal Medicine | Admitting: Internal Medicine

## 2020-07-27 ENCOUNTER — Other Ambulatory Visit: Payer: Self-pay

## 2020-07-27 DIAGNOSIS — C719 Malignant neoplasm of brain, unspecified: Secondary | ICD-10-CM

## 2020-07-27 IMAGING — MR MR HEAD WO/W CM
13 series · 48 of 48 positions shown · IV contrast (multihance)
Comparison: Outside brain MRI [DATE]

CLINICAL DATA: Left temporal anaplastic astrocytoma with open
biopsy [DATE]. Subsequent radiation and Temodar.

EXAM:
MRI HEAD WITHOUT AND WITH CONTRAST
TECHNIQUE: Multiplanar, multiecho pulse sequences of the brain and surrounding
structures were obtained without and with intravenous contrast.
CONTRAST:  20mL MULTIHANCE GADOBENATE DIMEGLUMINE 529 MG/ML IV SOLN

[Series 2: T1 · sagittal · 5.0mm · 0.45mm/px · 1 of 25 slices shown]
[im 1/25]
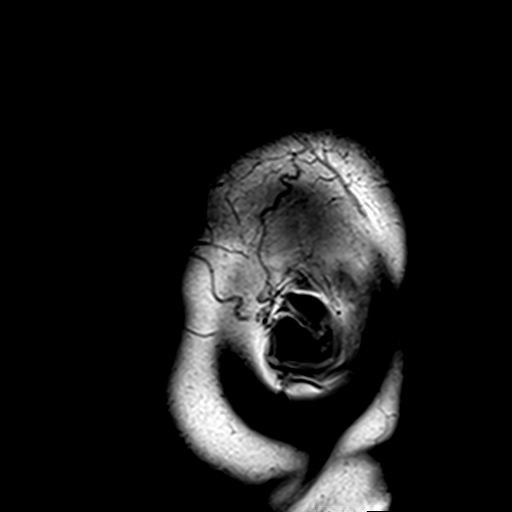

[Series 3: DWI · axial · 3.0mm · 1.80mm/px · z∈[-41,+115]mm · 6 of 106 slices shown (1 of 4)]
[im 1/106]
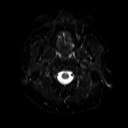
[im 22/106]
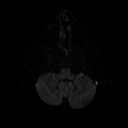
[im 43/106]
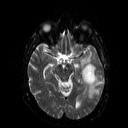
[im 64/106]
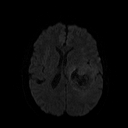
[im 85/106]
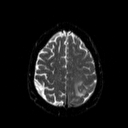
[im 106/106]
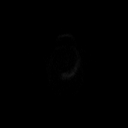

[Series 4: DWI · axial · 3.0mm · 1.80mm/px · z∈[-41,+115]mm · 3 of 53 slices shown (2 of 4)]
[im 1/53]
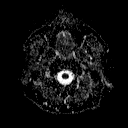
[im 27/53]
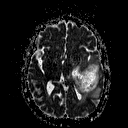
[im 53/53]
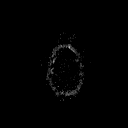

[Series 5: DWI · coronal · 5.0mm · 1.80mm/px · 5 of 74 slices shown (3 of 4)]
[im 1/74]
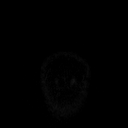
[im 19/74]
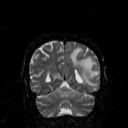
[im 37/74]
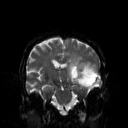
[im 55/74]
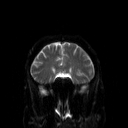
[im 74/74]
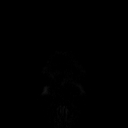

[Series 6: DWI · coronal · 5.0mm · 1.80mm/px · 2 of 37 slices shown (4 of 4)]
[im 1/37]
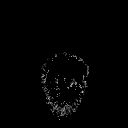
[im 37/37]
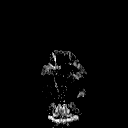

[Series 7: T2 · axial · 5.0mm · 0.60mm/px · z∈[-37,+118]mm · 2 of 24 slices shown (1 of 2)]
[im 1/24]
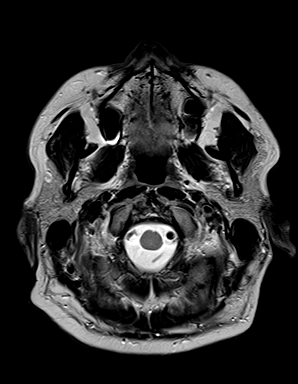
[im 24/24]
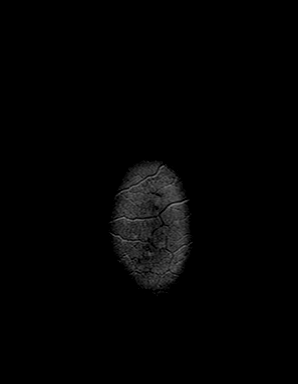

[Series 8: FLAIR · axial · 3.0mm · 0.45mm/px · z∈[-37,+116]mm · 2 of 34 slices shown]
[im 1/34]
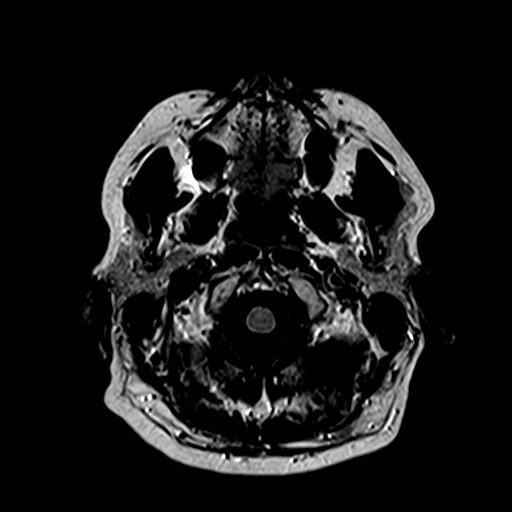
[im 34/34]
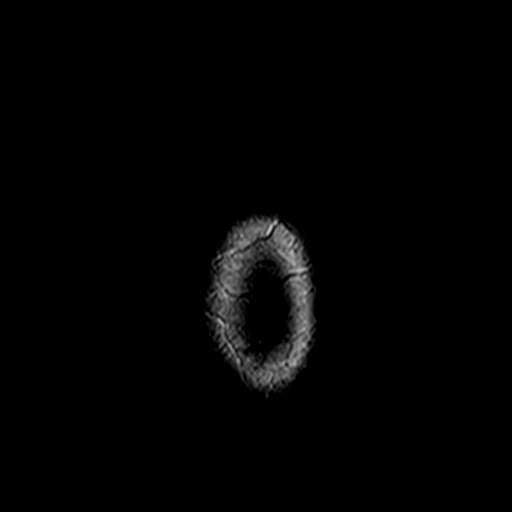

[Series 10: swi_images · axial · 4.0mm · 0.90mm/px · z∈[-39,+117]mm · 3 of 40 slices shown]
[im 1/40]
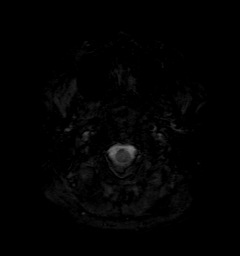
[im 20/40]
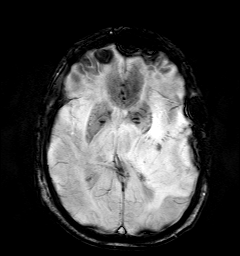
[im 40/40]
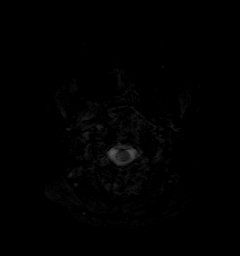

[Series 11: t1_mpr_tra · axial · 1.0mm · 0.75mm/px · z∈[-35,+108]mm · 9 of 144 slices shown (1 of 2)]
[im 1/144]
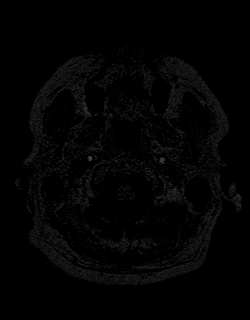
[im 18/144]
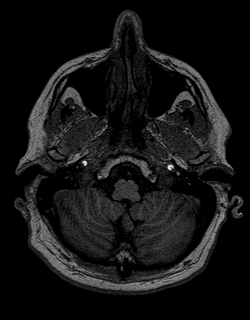
[im 36/144]
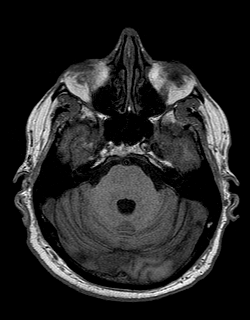
[im 54/144]
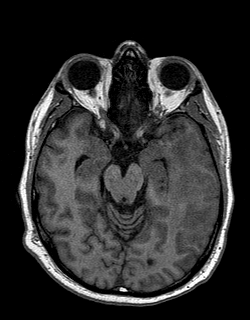
[im 72/144]
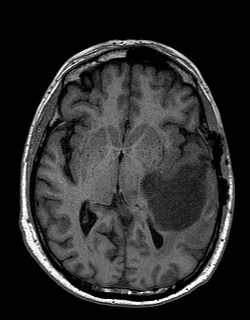
[im 90/144]
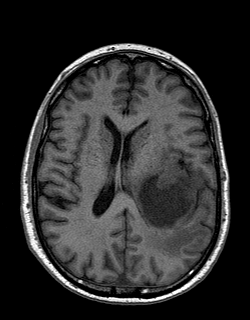
[im 108/144]
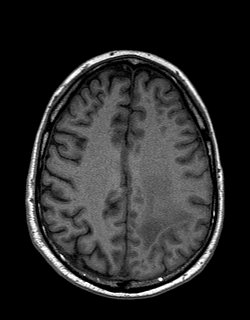
[im 126/144]
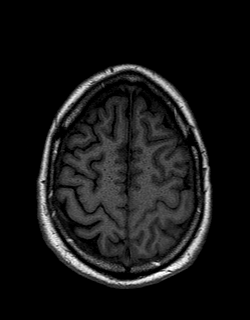
[im 144/144]
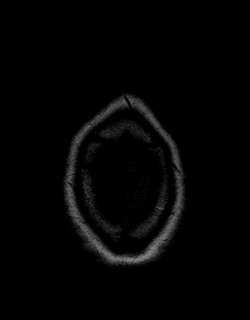

[Series 12: T2 · coronal · 5.0mm · 0.45mm/px · 2 of 29 slices shown (2 of 2)]
[im 1/29]
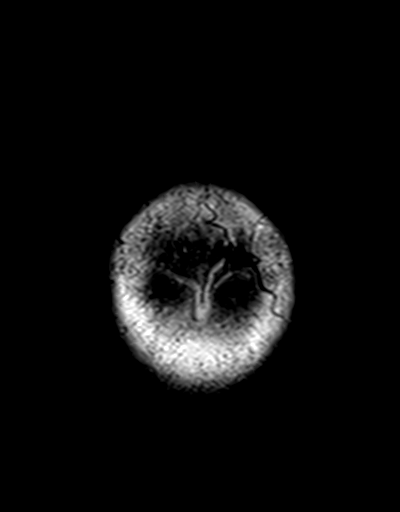
[im 29/29]
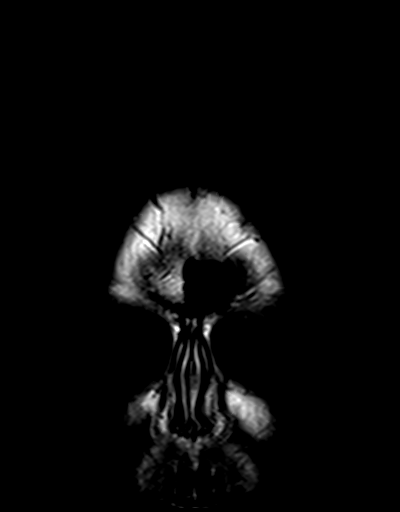

[Series 13: t1_mpr_tra · axial · 1.0mm · 0.75mm/px · z∈[-35,+108]mm · 9 of 144 slices shown (2 of 2)]
[im 1/144]
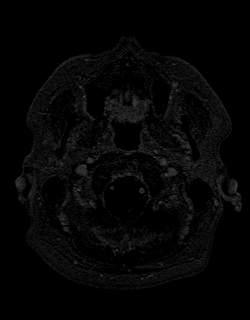
[im 18/144]
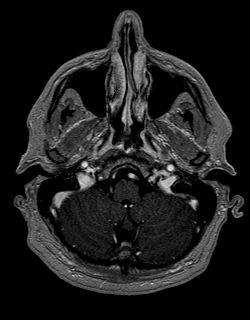
[im 36/144]
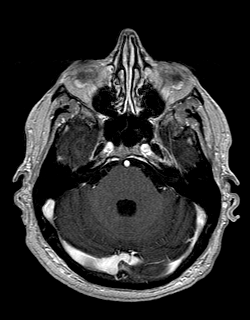
[im 54/144]
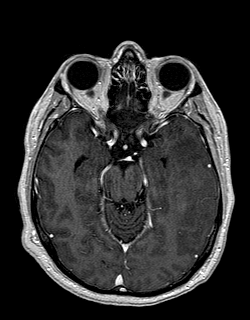
[im 72/144]
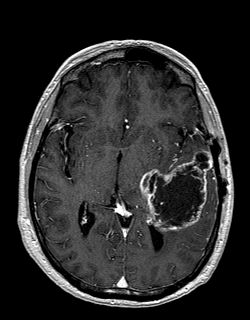
[im 90/144]
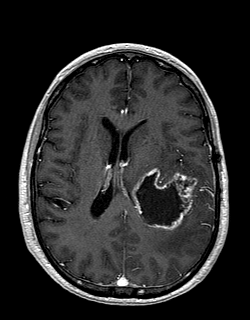
[im 108/144]
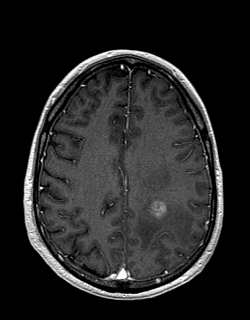
[im 126/144]
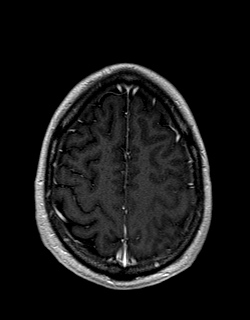
[im 144/144]
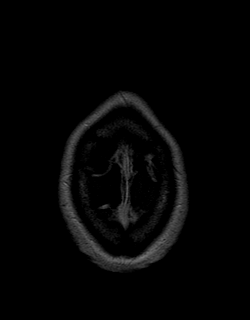

[Series 14: post cor · coronal · 5.0mm · 0.45mm/px · 2 of 29 slices shown]
[im 1/29]
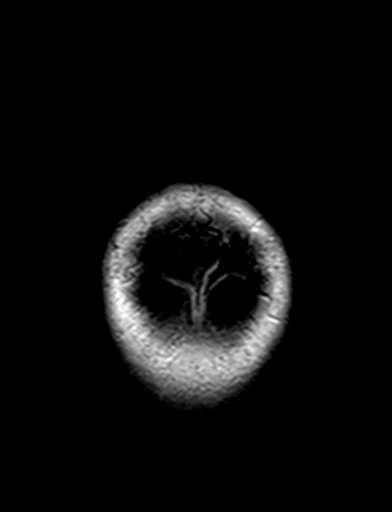
[im 29/29]
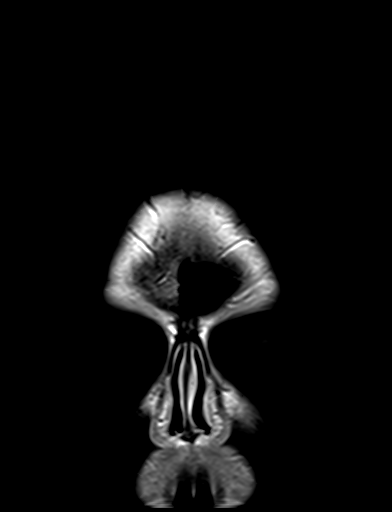

[Series 15: post sag (optional · sagittal · 5.0mm · 0.45mm/px · 2 of 25 slices shown]
[im 1/25]
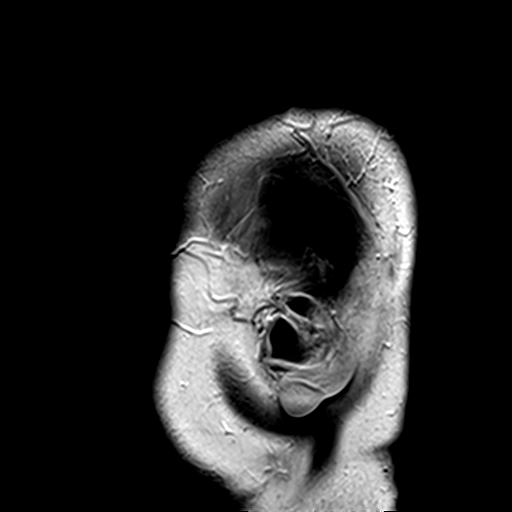
[im 25/25]
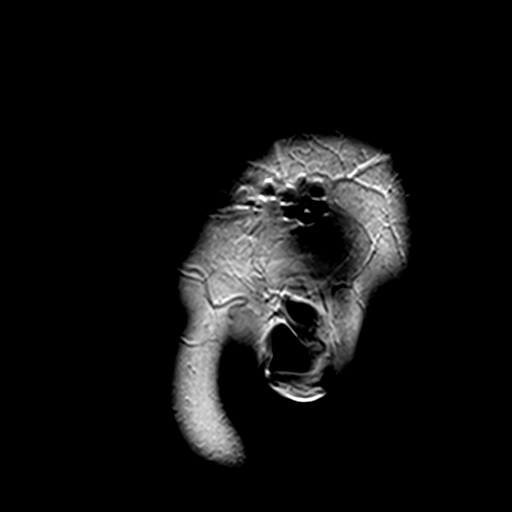

[48 of 48 positions shown; findings below may reference images not displayed]

FINDINGS: BRAIN

Known glioma centered in the superior and posterior left temporal
lobe with enlargement since single available comparison. Dimensions
are 5.1 x 5.2 x 4.4 cm. The mass has a more distinct central
necrotic area, the majority of the tumor volume. There is new T2
hyperintensity adjacent to the mass with swelling and new midline
shift measuring 6 mm. No infarct or extra-axial collection. There
are a few chronic blood products scattered about the mass on
gradient imaging.

Vascular: Normal flow voids and vascular enhancements

Skull and upper cervical spine: Unremarkable craniotomy site.

Sinuses/Orbits: Negative

These results will be called to the ordering clinician or
representative by the Radiologist Assistant, and communication
documented in the PACS or [REDACTED].
IMPRESSION: Progressive mass with coalesced necrosis and new regional T2 signal.
There is new midline shift measuring 6 mm.

## 2020-07-27 MED ORDER — GADOBENATE DIMEGLUMINE 529 MG/ML IV SOLN
20.0000 mL | Freq: Once | INTRAVENOUS | Status: AC | PRN
  Administered 2020-07-27: 20 mL via INTRAVENOUS

## 2020-07-30 ENCOUNTER — Other Ambulatory Visit: Payer: Self-pay

## 2020-07-30 ENCOUNTER — Inpatient Hospital Stay: Payer: 59 | Attending: Neurosurgery | Admitting: Internal Medicine

## 2020-07-30 ENCOUNTER — Telehealth: Payer: Self-pay | Admitting: Pharmacist

## 2020-07-30 VITALS — BP 142/92 | HR 66 | Temp 97.7°F | Resp 18 | Ht 71.0 in | Wt 183.1 lb

## 2020-07-30 DIAGNOSIS — Z923 Personal history of irradiation: Secondary | ICD-10-CM | POA: Diagnosis not present

## 2020-07-30 DIAGNOSIS — R112 Nausea with vomiting, unspecified: Secondary | ICD-10-CM | POA: Insufficient documentation

## 2020-07-30 DIAGNOSIS — Z79899 Other long term (current) drug therapy: Secondary | ICD-10-CM | POA: Diagnosis not present

## 2020-07-30 DIAGNOSIS — C719 Malignant neoplasm of brain, unspecified: Secondary | ICD-10-CM

## 2020-07-30 DIAGNOSIS — E7800 Pure hypercholesterolemia, unspecified: Secondary | ICD-10-CM | POA: Insufficient documentation

## 2020-07-30 DIAGNOSIS — Z7952 Long term (current) use of systemic steroids: Secondary | ICD-10-CM | POA: Diagnosis not present

## 2020-07-30 DIAGNOSIS — Z7289 Other problems related to lifestyle: Secondary | ICD-10-CM | POA: Insufficient documentation

## 2020-07-30 DIAGNOSIS — Z87442 Personal history of urinary calculi: Secondary | ICD-10-CM | POA: Insufficient documentation

## 2020-07-30 DIAGNOSIS — R200 Anesthesia of skin: Secondary | ICD-10-CM | POA: Insufficient documentation

## 2020-07-30 MED ORDER — ONDANSETRON HCL 8 MG PO TABS: 8.0000 mg | ORAL_TABLET | Freq: Two times a day (BID) | ORAL | 1 refills | Status: DC | PRN

## 2020-07-30 MED ORDER — TEMOZOLOMIDE 100 MG PO CAPS: 150.0000 mg/m2/d | ORAL_CAPSULE | Freq: Every day | ORAL | 0 refills | Status: AC

## 2020-07-30 MED ORDER — TEMOZOLOMIDE 100 MG PO CAPS: 150.0000 mg/m2/d | ORAL_CAPSULE | Freq: Every day | ORAL | 0 refills | Status: DC

## 2020-07-30 NOTE — Telephone Encounter (Signed)
Oral Oncology Pharmacist Encounter  Received new prescription for Temodar (temozolomide) for the maintenance treatment of anaplastic glioma, planned duration 6-12 months.  Prescription dose and frequency assessed for appropriateness. Appropriate for therapy initiation.   CBC w/ Diff and CMP from 06/28/20 assessed, noted at time patient's T. Bili slightly elevated at 1.4 mg/dL - all other labs stable. No baseline dose adjustments required.  Current medication list in Epic reviewed, no relevant/significant DDIs with Temodar identified.  Evaluated chart and no patient barriers to medication adherence noted.   Patient required to fill Temodar through [facility] due to insurance requirements. Prescription redirected to their pharmacy for dispensing.   Oral Oncology Clinic will continue to follow for insurance authorization, copayment issues, initial counseling and start date.  Leron Croak, PharmD, BCPS Hematology/Oncology Clinical Pharmacist [facility] [phone] 07/30/2020 4:17 PM

## 2020-07-30 NOTE — Progress Notes (Signed)
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

## 2020-07-30 NOTE — Progress Notes (Signed)
[facility] [place], [place] [address] [phone]   Interval Evaluation  Date of Service: 07/30/20 Patient Name: Christopher Tucker Patient MRN: [id number] Patient DOB: 1961/08/23 Provider: [name], MD  Identifying Statement:  Christopher Tucker is a 59 y.o. male with left temporal anaplastic astrocytoma   Oncologic History: 04/25/20: Craniotomy, open biopsy by Dr. Kathyrn Sheriff  Biomarkers:  MGMT Unknown.  IDH 1/2 Wild type.  EGFR Unknown  TERT "Mutated   Interval History:  Christopher Tucker presents today for follow up, now having completed radiation and temozolomide.  He continues to feel at prior baseline, now on 66m daily decadron.  No new or progressive changes.  Denies seizures or headaches. Continues to work every weekday, half of day.      H+P (05/10/20) Patient presented to medical attention in early August with several months history of sensory episodes.  They are described as "numbness marching down right arm also involving the leg, lasting for several minutes".  There is no post event weakness or confusion.  No known history of seizures.  In recent weeks he has also experienced some difficulty with expressive language, finding particular words and maintaining high fluency.  Since starting Keppra post-operatively he has not had any recurrence of these sensory events.  He has weaned off dexamethasone.  We are still awaiting finalized pathology results.    Medications: Current Outpatient Medications on File Prior to Visit  Medication Sig Dispense Refill  . dexamethasone (DECADRON) 4 MG tablet Take 1 tablet (4 mg total) by mouth 2 (two) times daily with a meal. 30 tablet 0  . levETIRAcetam (KEPPRA) 500 MG tablet Take 1 tablet (500 mg total) by mouth 2 (two) times daily. 60 tablet 1  . simvastatin (ZOCOR) 40 MG tablet Take 40 mg by mouth at bedtime.    .Marland Kitchenibuprofen (ADVIL) 200 MG tablet Take 400 mg by mouth 2 (two) times daily.  (Patient not taking: Reported on 07/20/2020)    . ondansetron (ZOFRAN) 8 MG tablet Take 1 tablet (8 mg total) by mouth 2 (two) times daily as needed (nausea and vomiting). May take 30-60 minutes prior to Temodar administration if nausea/vomiting occurs. (Patient not taking: Reported on 07/20/2020) 30 tablet 1   No current facility-administered medications on file prior to visit.    Allergies: No Known Allergies Past Medical History:  Past Medical History:  Diagnosis Date  . High cholesterol   . History of kidney stones    Past Surgical History:  Past Surgical History:  Procedure Laterality Date  . APPLICATION OF CRANIAL NAVIGATION Left 04/25/2020   Procedure: APPLICATION OF CRANIAL NAVIGATION;  Surgeon: NConsuella Lose MD;  [facility]  Service: Neurosurgery;  Laterality: Left;  . CRANIOTOMY Left 04/25/2020   Procedure: STEREOTACTIC LEFT TEMPORAL CRANIOTOMY FOR TUMOR;  Surgeon: NConsuella Lose MD;  [facility]  Service: Neurosurgery;  Laterality: Left;  . HERNIA REPAIR Left    groin    Social History:  Social History   Socioeconomic History  . Marital status: Married    Spouse name: Not on file  . Number of children: Not on file  . Years of education: Not on file  . Highest education level: Not on file  Occupational History  . Not on file  Tobacco Use  . Smoking status: Never Smoker  . Smokeless tobacco: Never Used  Substance and Sexual Activity  . Alcohol use: Yes    Alcohol/week: 6.0 standard drinks  Types: 3 Glasses of wine, 3 Cans of beer per week  . Drug use: Not Currently  . Sexual activity: Not on file  Other Topics Concern  . Not on file  Social History Narrative  . Not on file   Social Determinants of Health   Financial Resource Strain:   . Difficulty of Paying Living Expenses: Not on file  Food Insecurity:   . Worried About Charity fundraiser in the Last Year: Not on file  . Ran Out of Food in the Last Year: Not on file  Transportation  Needs:   . Lack of Transportation (Medical): Not on file  . Lack of Transportation (Non-Medical): Not on file  Physical Activity:   . Days of Exercise per Week: Not on file  . Minutes of Exercise per Session: Not on file  Stress:   . Feeling of Stress : Not on file  Social Connections:   . Frequency of Communication with Friends and Family: Not on file  . Frequency of Social Gatherings with Friends and Family: Not on file  . Attends Religious Services: Not on file  . Active Member of Clubs or Organizations: Not on file  . Attends Archivist Meetings: Not on file  . Marital Status: Not on file  Intimate Partner Violence:   . Fear of Current or Ex-Partner: Not on file  . Emotionally Abused: Not on file  . Physically Abused: Not on file  . Sexually Abused: Not on file   Family History: No family history on file.  Review of Systems: Constitutional: Doesn't report fevers, chills or abnormal weight loss Eyes: Doesn't report blurriness of vision Ears, nose, mouth, throat, and face: Doesn't report sore throat Respiratory: Doesn't report cough, dyspnea or wheezes Cardiovascular: Doesn't report palpitation, chest discomfort  Gastrointestinal:  Doesn't report nausea, constipation, diarrhea GU: Doesn't report incontinence Skin: Doesn't report skin rashes Neurological: Per HPI Musculoskeletal: Doesn't report joint pain Behavioral/Psych: Doesn't report anxiety  Physical Exam: Vitals:   07/30/20 1216  BP: (!) 142/92  Pulse: 66  Resp: 18  Temp: 97.7 F (36.5 C)  SpO2: 98%   KPS: 90. General: Alert, cooperative, pleasant, in no acute distress Head: Normal EENT: No conjunctival injection or scleral icterus.  Lungs: Resp effort normal Cardiac: Regular rate Abdomen: Non-distended abdomen Skin: No rashes cyanosis or petechiae. Extremities: No clubbing or edema  Neurologic Exam: Mental Status: Awake, alert, attentive to examiner. Oriented to self and environment. Language  c/w mild transcortical expressive dyshpasia Cranial Nerves: Visual acuity is grossly normal. Visual fields are full. Extra-ocular movements intact. No ptosis. Face is symmetric Motor: Tone and bulk are normal. Power is full in both arms and legs. Reflexes are symmetric, no pathologic reflexes present.  Sensory: Intact to light touch Gait: Normal.   Labs: I have reviewed the data as listed    Component Value Date/Time   NA 139 06/28/2020 0836   K 4.4 06/28/2020 0836   CL 107 06/28/2020 0836   CO2 28 06/28/2020 0836   GLUCOSE 85 06/28/2020 0836   BUN 21 (H) 06/28/2020 0836   CREATININE 0.90 06/28/2020 0836   CALCIUM 9.6 06/28/2020 0836   PROT 7.0 06/28/2020 0836   ALBUMIN 3.9 06/28/2020 0836   AST 24 06/28/2020 0836   ALT 39 06/28/2020 0836   ALKPHOS 51 06/28/2020 0836   BILITOT 1.4 (H) 06/28/2020 0836   GFRNONAA >60 06/28/2020 0836   GFRAA >60 06/14/2020 9528   Lab Results  Component Value Date  WBC 4.5 06/28/2020   NEUTROABS 3.0 06/28/2020   HGB 15.3 06/28/2020   HCT 43.9 06/28/2020   MCV 85.6 06/28/2020   PLT 171 06/28/2020    Imaging:  [place] Clinician Interpretation: I have personally reviewed the CNS images as listed.  My interpretation, in the context of the patient's clinical presentation, is treatment effect vs true progression  MR BRAIN W WO CONTRAST  Result Date: 07/27/2020 CLINICAL DATA:  Left temporal anaplastic astrocytoma with open biopsy 04/25/2020. Subsequent radiation and Temodar. EXAM: MRI HEAD WITHOUT AND WITH CONTRAST TECHNIQUE: Multiplanar, multiecho pulse sequences of the brain and surrounding structures were obtained without and with intravenous contrast. CONTRAST:  771m MULTIHANCE GADOBENATE DIMEGLUMINE 529 MG/ML IV SOLN COMPARISON:  Outside brain MRI 04/17/2020 FINDINGS: BRAIN Known glioma centered in the superior and posterior left temporal lobe with enlargement since single available comparison. Dimensions are 5.1 x 5.2 x 4.4 cm. The mass has a  more distinct central necrotic area, the majority of the tumor volume. There is new T2 hyperintensity adjacent to the mass with swelling and new midline shift measuring 6 mm. No infarct or extra-axial collection. There are a few chronic blood products scattered about the mass on gradient imaging. Vascular: Normal flow voids and vascular enhancements Skull and upper cervical spine: Unremarkable craniotomy site. Sinuses/Orbits: Negative These results will be called to the ordering clinician or representative by the Radiologist Assistant, and communication documented in the PACS or [employer] IMPRESSION: Progressive mass with coalesced necrosis and new regional T2 signal. There is new midline shift measuring 6 mm. Electronically Signed   By: JMonte FantasiaM.D.   On: 07/27/2020 12:25    Assessment/Plan Anaplastic astrocytoma, IDH-wildtype ([place]arrant [C71.9]  MAmritpal Shropshireis clinically stable today, now having completed radiation and concurrent Temodar.  Brain MRI demonstrates progressive changes within high dose radiation field most likely consistent with pseudo-progression.  True organic progression of tumor remains a possibility.  Relative light symptom burden and positive response to steroid challenge are both positive indicators.  We recommended initiating treatment with Temozolomide 1569mm2, on for five days and off for twenty three days in twenty eight day cycles. The patient will have a complete blood count performed on days 21 and 28 of each cycle, and a comprehensive metabolic panel performed on day 28 of each cycle. Labs may need to be performed more often. Zofran will prescribed for home use for nausea/vomiting.   Chemotherapy should be held for the following:  ANC less than 1,000  Platelets less than 100,000  LFT or creatinine greater than 2x ULN  If clinical concerns/contraindications develop  We recommended continuing decadron 71m78maily given considerable burden of inflammation  within eloquent territory, high functional status and desire to continue working.  We ask that Christopher Klingelturn to clinic in 1 months with brain MRI for evaluation, given progression noted here today.    All questions were answered. The patient knows to call the clinic with any problems, questions or concerns. No barriers to learning were detected.  I have spent a total of 40 minutes of face-to-face and non-face-to-face time, excluding clinical staff time, preparing to see patient, ordering tests and/or medications, counseling the patient, and independently interpreting results and communicating results to the patient/family/caregiver    ZacVentura SellersD Medical Director of Neuro-Oncology [facility] [place]/08/21 3:17 PM

## 2020-07-31 ENCOUNTER — Telehealth: Payer: Self-pay | Admitting: Internal Medicine

## 2020-07-31 NOTE — Telephone Encounter (Signed)
Scheduled per 11/8 los. Unable to reach pt. Left voicemail with appt times and date.

## 2020-08-01 ENCOUNTER — Other Ambulatory Visit: Payer: Self-pay | Admitting: Internal Medicine

## 2020-08-01 NOTE — Telephone Encounter (Signed)
Pts wife returned the call and advised she will call Wells Guiles at [phone].

## 2020-08-01 NOTE — Telephone Encounter (Signed)
Oral Chemotherapy Pharmacist Encounter   Attempted to reach patient to provide update on oral medication: Temodar (temozolomide).  No answer.  Left voicemail for patient to call back to discuss details of medication acquisition and initial counseling session.  Leron Croak, PharmD, BCPS Hematology/Oncology Clinical Pharmacist [facility] [phone] 08/01/2020 9:19 AM

## 2020-08-02 ENCOUNTER — Telehealth: Payer: Self-pay

## 2020-08-02 NOTE — Telephone Encounter (Signed)
Oral Oncology Patient Advocate Encounter  Received notification from [employer] that prior authorization for Temozolomide is required.  PA submitted by phone [id number] [place] [id number] Status is pending  Oral Oncology Clinic will continue to follow.   [place] Patient [place] Phone [phone] Fax [phone] 08/02/2020 12:05 PM

## 2020-08-02 NOTE — Telephone Encounter (Signed)
Rx refill request

## 2020-08-02 NOTE — Telephone Encounter (Addendum)
Oral Chemotherapy Pharmacist Encounter  I spoke with patient and wife for overview of: Temodar (temozolomide) for the maintenance treatment of anaplastic glioma, planned duration 6-12 months of treatment.  Counseled on administration, dosing, side effects, monitoring, drug-food interactions, safe handling, storage, and disposal.  Patient will take Temodar 100mg  capsules, 3 capsules, (300 mg total daily dose), by mouth once daily, may take at bedtime and on an empty stomach to decrease nausea and vomiting.  If 1st cycle is well tolerated, Mr. Agard informed that Temodar dose may be increased to 200 mg/m2 daily for 5 days on, 23 days off, repeated every 28 days for subsequent cycles   Patient will take Temodar daily for 5 days on, 23 days off, and repeated.  Temodar start date: pending medication delivery from [facility]   Patient will take Zofran 8mg  tablet, 1 tablet by mouth 30-60 min prior to Temodar dose to help decrease N/V.   Adverse effects include but are not limited to: nausea, vomiting, anorexia, GI upset, rash, drug fever, and fatigue. Rare but serious adverse effects of pneumocystis pneumonia and secondary malignancy also discussed.  We discussed strategies to manage constipation if they occur secondary to ondansetron dosing.  PCP prophylaxis will not be initiated at this time, but may be added based on lymphocyte count in the future.  Reviewed importance of keeping a medication schedule and plan for any missed doses. No barriers to medication adherence identified.  Medication reconciliation performed and medication/allergy list updated.  Patient's insurance requires that Temodar be filled through [facility] - wife stated that they received notification that a prescription was set up to be delivered to their home on 08/06/20, which appears to be the patient's Zofran. Wife updated that pharmacy was notified that when calling to check on status of Temodar on 08/02/20 we were notified  that prior authorization is required for the medication. Prior authorization submitted via phone on 08/02/20.   All questions answered.  Mr. Carlini voiced understanding and appreciation.   Medication education handout and medication calendar placed in mail for patient. Patient knows to call the office with questions or concerns. Oral Chemotherapy Clinic phone number provided to patient.   Leron Croak, PharmD, BCPS Hematology/Oncology Clinical Pharmacist [facility] [phone] 08/02/2020 8:27 AM

## 2020-08-03 NOTE — Telephone Encounter (Signed)
Oral Oncology Pharmacist Encounter   Prior Authorization for Temodar (temozolomide) is still pending per [facility]. Determination takes up to 72 hours. Patient will be notified per [facility] when PA is approved.  Will follow up status on 08/06/20.  Leron Croak, PharmD, BCPS Hematology/Oncology Clinical Pharmacist [facility] [phone] 08/03/2020 4:15 PM

## 2020-08-06 ENCOUNTER — Telehealth: Payer: Self-pay | Admitting: Radiation Oncology

## 2020-08-06 NOTE — Telephone Encounter (Signed)
  Radiation Oncology         [phone]) [phone] ________________________________  Name: Christopher Tucker MRN: [id number]  Date of Service: 08/06/2020  DOB: 09-May-1961  Post Treatment Telephone Note  Diagnosis:  Glioblastoma of the left temporal lobe.  Interval Since Last Radiation:  5 weeks   05/21/20-07/02/20: The left temporal lobe target was treated to 46 Gy in 23 fractions followed by a 14 Gy boost in 7 fractions.  Narrative:  The patient was contacted today for routine follow-up. During treatment he did very well with radiotherapy and did not have significant desquamation. He reports he is doing pretty well and ready to proceed with cyclic temodar. His fatigue has improved and thinking has become a bit more clear since radiation completed per report.  Impression/Plan: 1. Glioblastoma of the left temporal lobe.. The patient has been doing well since completion of radiotherapy. We discussed that we would be happy to continue to follow him as needed, but he will also continue to follow up with Dr. [name] in neuro oncology.     Carola Rhine, PAC

## 2020-08-06 NOTE — Telephone Encounter (Signed)
Oral Oncology Pharmacist Encounter  Called [employer] to follow-up status of prior authorization for temozolomide. Provided case ID number [id number] that was obtained on 08/02/20 after prior authorization was submitted.   Status still being processed by pharmacy benefits. Authorization requested and documented to be expedited.  Leron Croak, PharmD, BCPS Hematology/Oncology Clinical Pharmacist [facility] [phone] 08/06/2020 11:03 AM

## 2020-08-06 NOTE — Telephone Encounter (Signed)
Oral Oncology Pharmacist Encounter   Prior Authorization for Temodar (temozolomide) has been approved.     PA# [id number] Effective dates: 08/02/2020 through 08/06/2021  Information shared with [facility] to expedite medication shipment to patient.   Called and updated patient and his wife about authorization being approved.    Oral Oncology Clinic will continue to follow.   Leron Croak, PharmD, BCPS Hematology/Oncology Clinical Pharmacist [facility] [phone] 08/06/2020 2:50 PM

## 2020-08-07 NOTE — Telephone Encounter (Signed)
Oral Oncology Patient Advocate Encounter  Prior Authorization for Temozolomide has been approved.    PA# [id number] Effective dates: 08/06/20 through 08/06/21  Patient must fill at [facility] will continue to follow.   [place] Patient [place] Phone [phone] Fax [phone] 08/07/2020 12:04 PM

## 2020-08-07 NOTE — Telephone Encounter (Signed)
Oral Oncology Pharmacist Encounter  Patient scheduled to receive temozolomide from [facility] 08/08/20. Patient will start temozolomide on 08/08/20.  Leron Croak, PharmD, BCPS Hematology/Oncology Clinical Pharmacist [facility] [phone] 08/07/2020 1:13 PM

## 2020-08-08 ENCOUNTER — Other Ambulatory Visit: Payer: Self-pay | Admitting: *Deleted

## 2020-08-08 MED ORDER — LEVETIRACETAM 500 MG PO TABS
500.0000 mg | ORAL_TABLET | Freq: Two times a day (BID) | ORAL | 1 refills | Status: DC
Start: 2020-08-08 — End: 2020-11-27

## 2020-08-08 MED FILL — ONDANSETRON HCL 8 MG TABLET: 8 | 15 days supply | Qty: 30 | Fill #1

## 2020-08-10 ENCOUNTER — Telehealth: Payer: Self-pay

## 2020-08-10 NOTE — Telephone Encounter (Signed)
Pt wife LM wanting to know if the pt can have beer and/or wine?

## 2020-08-13 ENCOUNTER — Telehealth: Payer: Self-pay | Admitting: *Deleted

## 2020-08-13 NOTE — Telephone Encounter (Signed)
Communicated response regarding Beer/Wine intake per Dr. [name].

## 2020-08-15 ENCOUNTER — Other Ambulatory Visit: Payer: Self-pay | Admitting: Radiation Therapy

## 2020-08-19 ENCOUNTER — Other Ambulatory Visit: Payer: Self-pay | Admitting: Internal Medicine

## 2020-08-20 ENCOUNTER — Telehealth: Payer: Self-pay | Admitting: *Deleted

## 2020-08-20 NOTE — Telephone Encounter (Signed)
Received vm call from pt's wife stating pt has a rash on his back & torso.  She described as small red dots, not raised & not itching.  Returned call to [facility] & she reports pt has been on Temozolomide 11/17-11/21. She denies any other symptoms.  Message routed to Dr [name].

## 2020-08-20 NOTE — Telephone Encounter (Signed)
Rash may be associated with Temodar.  We can monitor it for now as long as mild and otherwise asymptomatic.  [name], MD

## 2020-08-21 NOTE — Telephone Encounter (Signed)
Called & spoke with wife, [name] & gave message from Dr [name].  Instructed to call if symptomatic.

## 2020-08-25 NOTE — Progress Notes (Signed)
  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  The patient then received a 14 Gy boost to yield a final dose of 60 Gy.  Narrative: The patient tolerated radiation treatment relatively well.     Plan: The patient has completed radiation treatment. The patient will return to radiation oncology clinic for routine followup in one month. I advised the patient to call or return sooner if they have any questions or concerns related to their recovery or treatment. ________________________________  Jodelle Gross, M.D., Ph.D.   m

## 2020-08-27 ENCOUNTER — Ambulatory Visit: Payer: 59 | Admitting: Internal Medicine

## 2020-08-30 ENCOUNTER — Ambulatory Visit: Payer: 59 | Admitting: Internal Medicine

## 2020-08-31 ENCOUNTER — Ambulatory Visit
Admission: RE | Admit: 2020-08-31 | Discharge: 2020-08-31 | Disposition: A | Discharge status: Home / Self Care | Payer: 59 | Source: Ambulatory Visit | Attending: Internal Medicine | Admitting: Internal Medicine

## 2020-08-31 ENCOUNTER — Other Ambulatory Visit: Payer: Self-pay

## 2020-08-31 DIAGNOSIS — C719 Malignant neoplasm of brain, unspecified: Secondary | ICD-10-CM

## 2020-08-31 IMAGING — MR MR HEAD WO/W CM
12 series · 48 of 48 positions shown · IV contrast (16ml Multihance)
Comparison: MRI of the brain [DATE].

CLINICAL DATA: Brain/CNS neoplasm, assess treatment response.

EXAM:
MRI HEAD WITHOUT AND WITH CONTRAST
TECHNIQUE: Multiplanar, multiecho pulse sequences of the brain and surrounding
structures were obtained without and with intravenous contrast.
CONTRAST:  16mL MULTIHANCE GADOBENATE DIMEGLUMINE 529 MG/ML IV SOLN

[Series 2: T1 · sagittal · 5.0mm · 0.45mm/px · 1 of 24 slices shown]
[im 1/24]
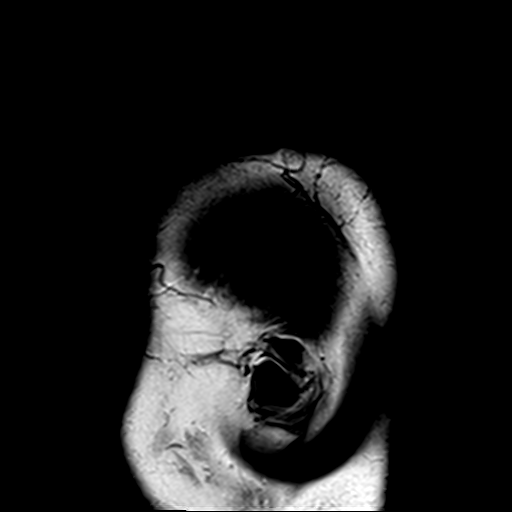

[Series 3: DWI · axial · 3.0mm · 1.80mm/px · z∈[-70,+82]mm · 7 of 104 slices shown (1 of 3)]
[im 1/104]
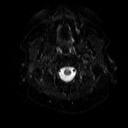
[im 18/104]
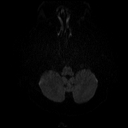
[im 35/104]
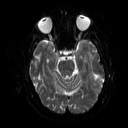
[im 52/104]
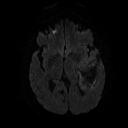
[im 69/104]
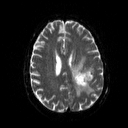
[im 86/104]
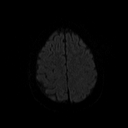
[im 104/104]
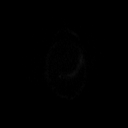

[Series 4: DWI · axial · 3.0mm · 1.80mm/px · z∈[-70,+82]mm · 4 of 52 slices shown (2 of 3)]
[im 1/52]
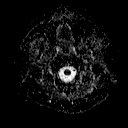
[im 18/52]
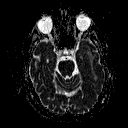
[im 35/52]
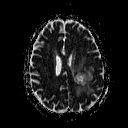
[im 52/52]
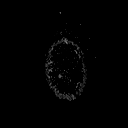

[Series 5: DWI · coronal · 5.0mm · 1.80mm/px · 5 of 71 slices shown (3 of 3)]
[im 1/71]
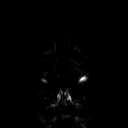
[im 18/71]
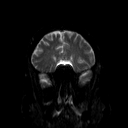
[im 36/71]
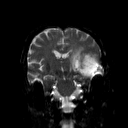
[im 53/71]
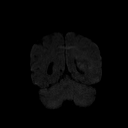
[im 71/71]
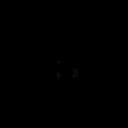

[Series 7: T2 · axial · 5.0mm · 0.60mm/px · 1 of 22 slices shown (1 of 2)]
[im 1/22]
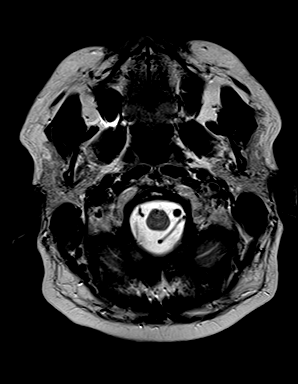

[Series 8: FLAIR · axial · 3.0mm · 0.45mm/px · z∈[-61,+73]mm · 2 of 30 slices shown]
[im 1/30]
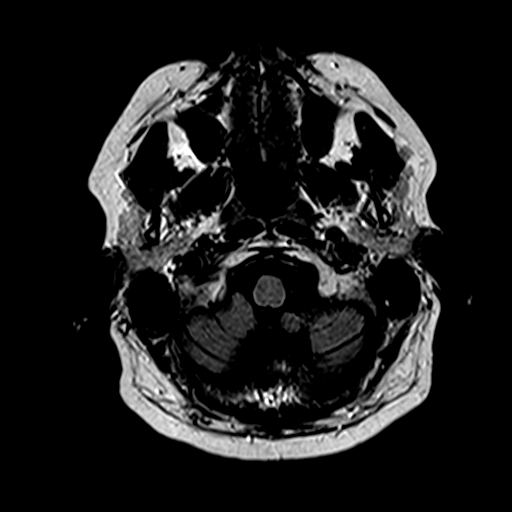
[im 30/30]
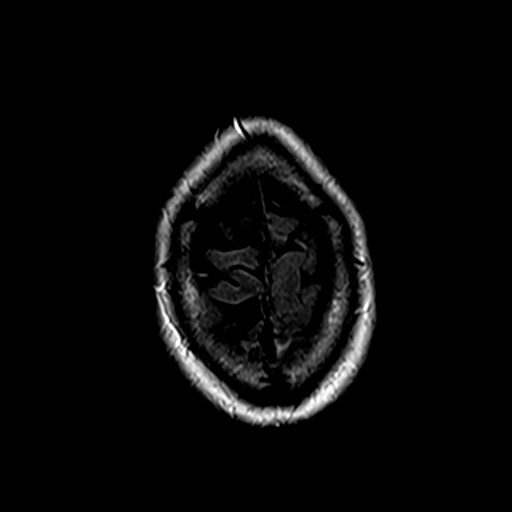

[Series 10: swi_images · axial · 4.0mm · 0.90mm/px · z∈[-64,+76]mm · 2 of 36 slices shown]
[im 1/36]
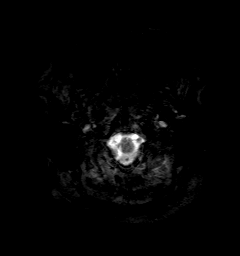
[im 36/36]
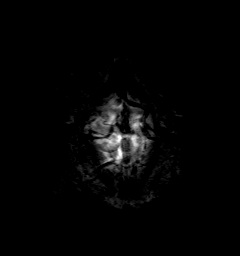

[Series 11: t1_mpr_tra · axial · 1.1mm · 0.75mm/px · z∈[-68,+85]mm · 10 of 144 slices shown (1 of 2)]
[im 1/144]
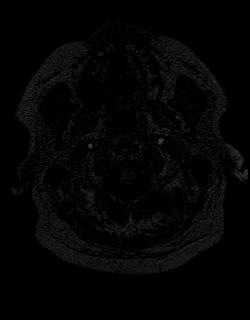
[im 16/144]
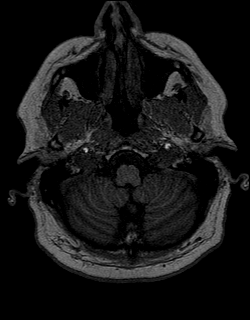
[im 32/144]
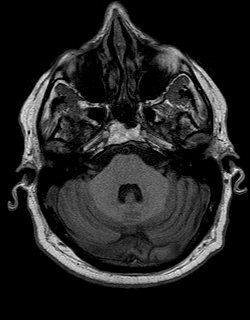
[im 48/144]
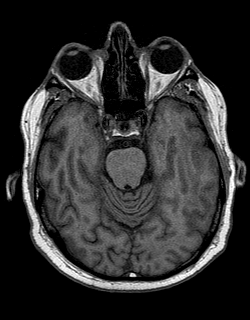
[im 64/144]
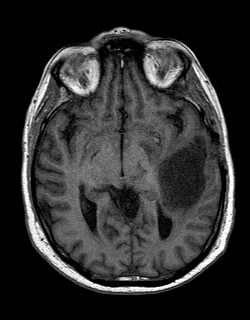
[im 80/144]
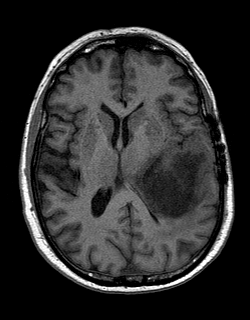
[im 96/144]
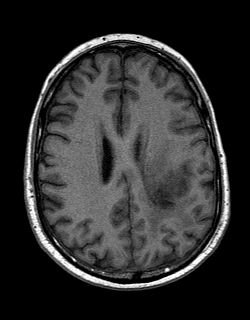
[im 112/144]
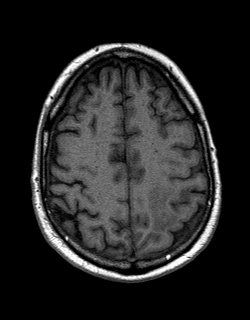
[im 128/144]
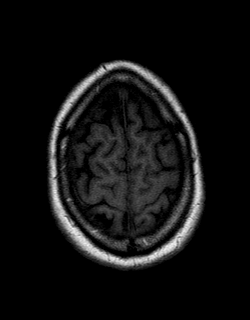
[im 144/144]
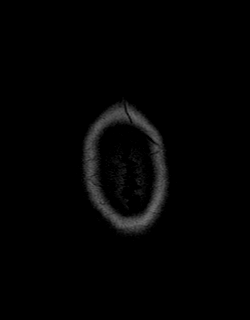

[Series 12: T2 · coronal · 5.0mm · 0.45mm/px · 2 of 28 slices shown (2 of 2)]
[im 1/28]
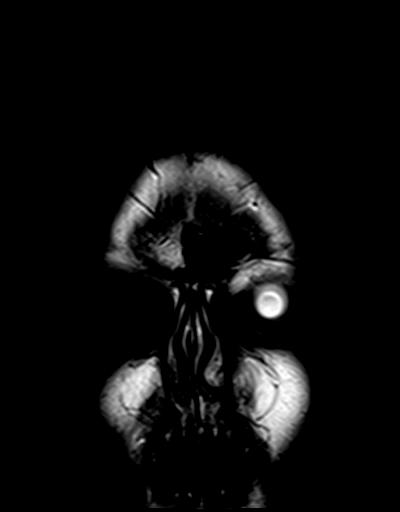
[im 28/28]
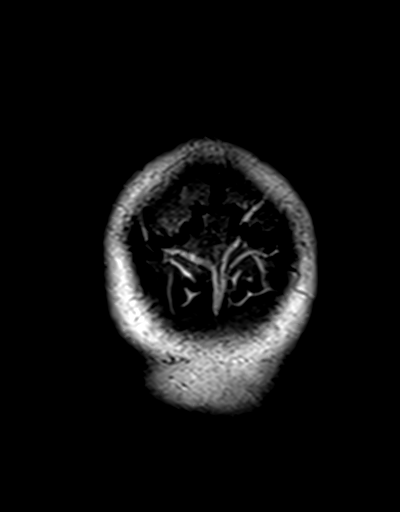

[Series 13: t1_mpr_tra · axial · 1.1mm · 0.75mm/px · z∈[-68,+85]mm · 10 of 144 slices shown (2 of 2)]
[im 1/144]
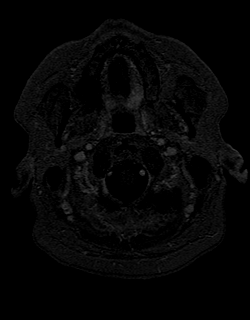
[im 16/144]
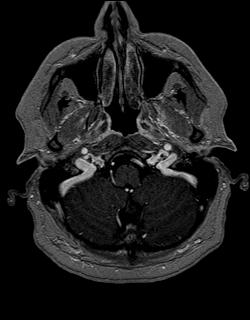
[im 32/144]
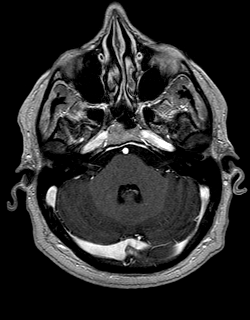
[im 48/144]
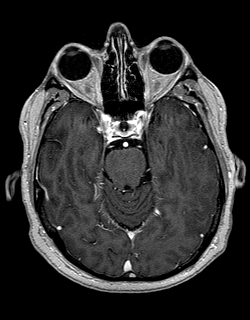
[im 64/144]
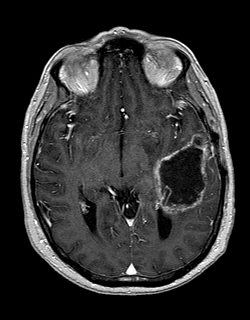
[im 80/144]
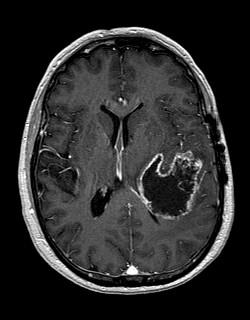
[im 96/144]
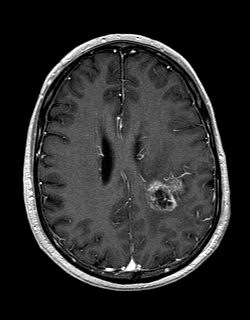
[im 112/144]
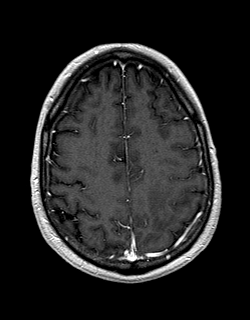
[im 128/144]
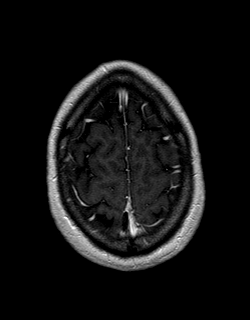
[im 144/144]
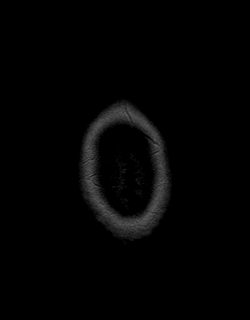

[Series 14: post cor · coronal · 5.0mm · 0.45mm/px · 2 of 28 slices shown]
[im 1/28]
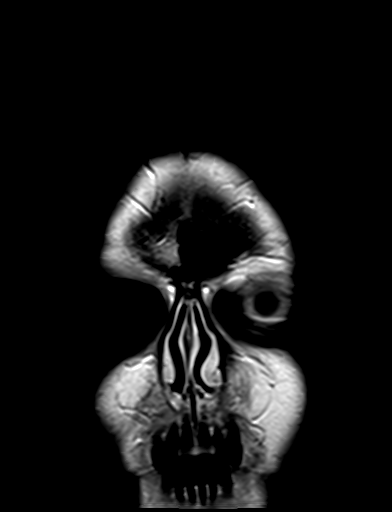
[im 28/28]
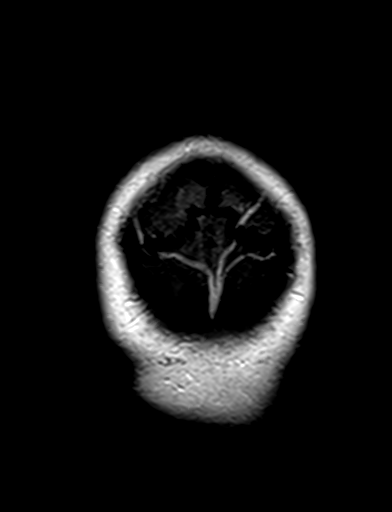

[Series 15: post sag (optional · sagittal · 5.0mm · 0.45mm/px · 2 of 24 slices shown]
[im 1/24]
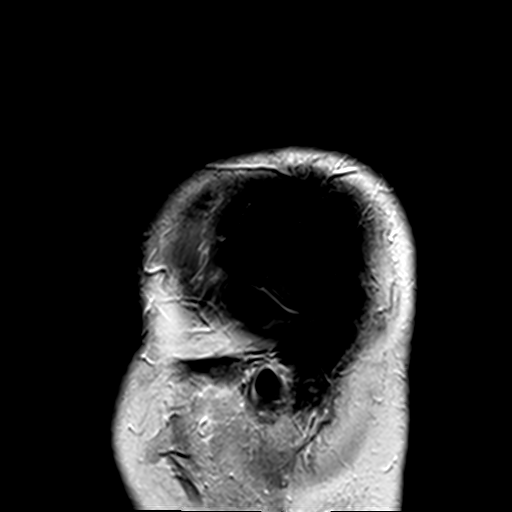
[im 24/24]
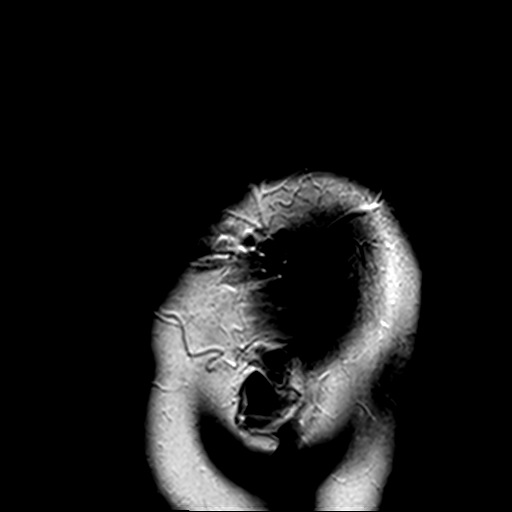

[48 of 48 positions shown; findings below may reference images not displayed]

FINDINGS: Brain: Redemonstrated large necrotic lesion with irregular
peripheral contrast enhancement within the left temporal lobe,
measuring approximately 5.3 x 5.2 x 4.5 cm, similar to prior MRI.
Stable T2 hyperintensity within the white matter surrounding the
lesion and extending into the left lenticular capsular region and
thalamus with stable 6 mm rightward midline shift.

No new lesion identified. No acute infarct, hemorrhage,
hydrocephalus or extra-axial fluid collection.

Vascular: Normal flow voids.

Skull and upper cervical spine: Postsurgical changes from left
temporal craniotomy. Marrow signal characteristics are otherwise
maintained.

Sinuses/Orbits: Negative.

Other: None.
IMPRESSION: Stable appearance of large necrotic lesion within the left temporal
lobe.

Unchanged perilesional T2 hyperintensity extending into the left
lenticular capsular region and thalamus with stable 6 mm rightward
midline shift.

## 2020-08-31 MED ORDER — GADOBENATE DIMEGLUMINE 529 MG/ML IV SOLN
16.0000 mL | Freq: Once | INTRAVENOUS | Status: AC | PRN
  Administered 2020-08-31: 16 mL via INTRAVENOUS

## 2020-09-03 ENCOUNTER — Inpatient Hospital Stay: Payer: 59

## 2020-09-03 ENCOUNTER — Other Ambulatory Visit: Payer: Self-pay

## 2020-09-03 ENCOUNTER — Inpatient Hospital Stay: Payer: 59 | Attending: Neurosurgery | Admitting: Internal Medicine

## 2020-09-03 VITALS — BP 139/96 | HR 70 | Temp 97.1°F | Resp 18 | Ht 71.0 in | Wt 185.4 lb

## 2020-09-03 DIAGNOSIS — E7800 Pure hypercholesterolemia, unspecified: Secondary | ICD-10-CM | POA: Insufficient documentation

## 2020-09-03 DIAGNOSIS — C719 Malignant neoplasm of brain, unspecified: Secondary | ICD-10-CM | POA: Insufficient documentation

## 2020-09-03 DIAGNOSIS — Z87442 Personal history of urinary calculi: Secondary | ICD-10-CM | POA: Insufficient documentation

## 2020-09-03 DIAGNOSIS — R7401 Elevation of levels of liver transaminase levels: Secondary | ICD-10-CM | POA: Insufficient documentation

## 2020-09-03 DIAGNOSIS — Z7289 Other problems related to lifestyle: Secondary | ICD-10-CM | POA: Diagnosis not present

## 2020-09-03 DIAGNOSIS — F32A Depression, unspecified: Secondary | ICD-10-CM | POA: Insufficient documentation

## 2020-09-03 DIAGNOSIS — D696 Thrombocytopenia, unspecified: Secondary | ICD-10-CM | POA: Diagnosis not present

## 2020-09-03 DIAGNOSIS — Z79899 Other long term (current) drug therapy: Secondary | ICD-10-CM | POA: Diagnosis not present

## 2020-09-03 DIAGNOSIS — Z923 Personal history of irradiation: Secondary | ICD-10-CM | POA: Diagnosis not present

## 2020-09-03 DIAGNOSIS — Z7952 Long term (current) use of systemic steroids: Secondary | ICD-10-CM | POA: Insufficient documentation

## 2020-09-03 LAB — COMPREHENSIVE METABOLIC PANEL
ALT: 136 U/L — ABNORMAL HIGH (ref 0–44)
AST: 52 U/L — ABNORMAL HIGH (ref 15–41)
Albumin: 3.1 g/dL — ABNORMAL LOW (ref 3.5–5.0)
Alkaline Phosphatase: 51 U/L (ref 38–126)
Anion gap: 7 (ref 5–15)
BUN: 20 mg/dL (ref 6–20)
CO2: 26 mmol/L (ref 22–32)
Calcium: 9.3 mg/dL (ref 8.9–10.3)
Chloride: 106 mmol/L (ref 98–111)
Creatinine, Ser: 1.05 mg/dL (ref 0.61–1.24)
GFR, Estimated: >60 mL/min (ref >60)
Glucose, Bld: 93 mg/dL (ref 70–99)
Potassium: 4.3 mmol/L (ref 3.5–5.1)
Sodium: 139 mmol/L (ref 135–145)
Total Bilirubin: 1.3 mg/dL — ABNORMAL HIGH (ref 0.3–1.2)
Total Protein: 6.5 g/dL (ref 6.5–8.1)

## 2020-09-03 LAB — CBC WITH DIFFERENTIAL/PLATELET
Abs Immature Granulocytes: 0.67 10*3/uL — ABNORMAL HIGH (ref 0.00–0.07)
Basophils Absolute: 0 10*3/uL (ref 0.0–0.1)
Basophils Relative: 0 %
Eosinophils Absolute: 0 10*3/uL (ref 0.0–0.5)
Eosinophils Relative: 0 %
HCT: 44.7 % (ref 39.0–52.0)
Hemoglobin: 15.4 g/dL (ref 13.0–17.0)
Immature Granulocytes: 6 %
Lymphocytes Relative: 4 %
Lymphs Abs: 0.4 10*3/uL — ABNORMAL LOW (ref 0.7–4.0)
MCH: 31.1 pg (ref 26.0–34.0)
MCHC: 34.5 g/dL (ref 30.0–36.0)
MCV: 90.3 fL (ref 80.0–100.0)
Monocytes Absolute: 0.7 10*3/uL (ref 0.1–1.0)
Monocytes Relative: 6 %
Neutro Abs: 9.5 10*3/uL — ABNORMAL HIGH (ref 1.7–7.7)
Neutrophils Relative %: 84 %
Platelets: 24 10*3/uL — ABNORMAL LOW (ref 150–400)
RBC: 4.95 MIL/uL (ref 4.22–5.81)
RDW: 13.1 % (ref 11.5–15.5)
WBC: 11.3 10*3/uL — ABNORMAL HIGH (ref 4.0–10.5)
nRBC: 0 % (ref 0.0–0.2)

## 2020-09-03 MED ORDER — SERTRALINE HCL 25 MG PO TABS: 25.0000 mg | ORAL_TABLET | Freq: Every day | ORAL | 3 refills | Status: DC

## 2020-09-03 MED ORDER — DEXAMETHASONE 4 MG PO TABS
2.0000 mg | ORAL_TABLET | Freq: Every day | ORAL | 0 refills | Status: DC
Start: 2020-09-03 — End: 2020-09-06

## 2020-09-03 NOTE — Progress Notes (Signed)
[facility] [place], [place] [address] [phone]   Interval Evaluation  Date of Service: 09/03/20 Patient Name: Christopher Tucker Patient MRN: [id number] Patient DOB: Jun 01, 1961 Provider: [name], MD  Identifying Statement:  Christopher Tucker is a 59 y.o. male with left temporal anaplastic astrocytoma   Oncologic History: 04/25/20: Craniotomy, open biopsy by Dr. Kathyrn Sheriff  Biomarkers:  MGMT Unknown.  IDH 1/2 Wild type.  EGFR Unknown  TERT "Mutated   Interval History:  Christopher Tucker presents today for follow up, now having completed 1st month of 5-day Temodar.  He describes increase in fatigue, lethargy this month.  He is less engaged with activities at home, with family, and feels generally depressed.  Developed a rash on trunk and arms soon after dosing the chemo. Continues on 48m daily decadron.  No complaint of right sided weakness, independent still with gait. Denies seizures or headaches.     H+P (05/10/20) Patient presented to medical attention in early August with several months history of sensory episodes.  They are described as "numbness marching down right arm also involving the leg, lasting for several minutes".  There is no post event weakness or confusion.  No known history of seizures.  In recent weeks he has also experienced some difficulty with expressive language, finding particular words and maintaining high fluency.  Since starting Keppra post-operatively he has not had any recurrence of these sensory events.  He has weaned off dexamethasone.  We are still awaiting finalized pathology results.    Medications: Current Outpatient Medications on File Prior to Visit  Medication Sig Dispense Refill  . dexamethasone (DECADRON) 4 MG tablet TAKE 1 TABLET (4 MG TOTAL) BY MOUTH DAILY. 30 tablet 0  . ibuprofen (ADVIL) 200 MG tablet Take 400 mg by mouth 2 (two) times daily. (Patient not taking: Reported on 07/20/2020)    .  levETIRAcetam (KEPPRA) 500 MG tablet Take 1 tablet (500 mg total) by mouth 2 (two) times daily. 60 tablet 1  . ondansetron (ZOFRAN) 8 MG tablet Take 1 tablet (8 mg total) by mouth 2 (two) times daily as needed (nausea and vomiting). May take 30-60 minutes prior to Temodar administration if nausea/vomiting occurs. 30 tablet 1  . simvastatin (ZOCOR) 40 MG tablet Take 40 mg by mouth at bedtime.     No current facility-administered medications on file prior to visit.    Allergies: No Known Allergies Past Medical History:  Past Medical History:  Diagnosis Date  . High cholesterol   . History of kidney stones    Past Surgical History:  Past Surgical History:  Procedure Laterality Date  . APPLICATION OF CRANIAL NAVIGATION Left 04/25/2020   Procedure: APPLICATION OF CRANIAL NAVIGATION;  Surgeon: NConsuella Lose MD;  [facility]  Service: Neurosurgery;  Laterality: Left;  . CRANIOTOMY Left 04/25/2020   Procedure: STEREOTACTIC LEFT TEMPORAL CRANIOTOMY FOR TUMOR;  Surgeon: NConsuella Lose MD;  [facility]  Service: Neurosurgery;  Laterality: Left;  . HERNIA REPAIR Left    groin    Social History:  Social History   Socioeconomic History  . Marital status: Married    Spouse name: Not on file  . Number of children: Not on file  . Years of education: Not on file  . Highest education level: Not on file  Occupational History  . Not on file  Tobacco Use  . Smoking status: Never Smoker  . Smokeless tobacco: Never Used  Substance and Sexual Activity  .  Alcohol use: Yes    Alcohol/week: 6.0 standard drinks    Types: 3 Glasses of wine, 3 Cans of beer per week  . Drug use: Not Currently  . Sexual activity: Not on file  Other Topics Concern  . Not on file  Social History Narrative  . Not on file   Social Determinants of Health   Financial Resource Strain: Not on file  Food Insecurity: Not on file  Transportation Needs: Not on file  Physical Activity: Not on file  Stress:  Not on file  Social Connections: Not on file  Intimate Partner Violence: Not on file   Family History: No family history on file.  Review of Systems: Constitutional: Doesn't report fevers, chills or abnormal weight loss Eyes: Doesn't report blurriness of vision Ears, nose, mouth, throat, and face: Doesn't report sore throat Respiratory: Doesn't report cough, dyspnea or wheezes Cardiovascular: Doesn't report palpitation, chest discomfort  Gastrointestinal:  Doesn't report nausea, constipation, diarrhea GU: Doesn't report incontinence Skin: Doesn't report skin rashes Neurological: Per HPI Musculoskeletal: Doesn't report joint pain Behavioral/Psych: Doesn't report anxiety  Physical Exam: Vitals:   09/03/20 1203  BP: (!) 139/96  Pulse: 70  Resp: 18  Temp: (!) 97.1 F (36.2 C)  SpO2: 100%   KPS: 80. General: cushingoid appearance Head: Normal EENT: No conjunctival injection or scleral icterus.  Lungs: Resp effort normal Cardiac: Regular rate Abdomen: Non-distended abdomen Skin: No rashes cyanosis or petechiae. Extremities: No clubbing or edema  Neurologic Exam: Mental Status: Awake, alert, attentive to examiner. Oriented to self and environment. Language c/w transcortical expressive dyshpasia Cranial Nerves: Visual acuity is grossly normal. Visual fields are full. Extra-ocular movements intact. No ptosis. Face is symmetric Motor: Tone and bulk are normal. Power is full in both arms and legs. Reflexes are symmetric, no pathologic reflexes present.  Sensory: Intact to light touch Gait: Normal.   Labs: I have reviewed the data as listed    Component Value Date/Time   NA 139 06/28/2020 0836   K 4.4 06/28/2020 0836   CL 107 06/28/2020 0836   CO2 28 06/28/2020 0836   GLUCOSE 85 06/28/2020 0836   BUN 21 (H) 06/28/2020 0836   CREATININE 0.90 06/28/2020 0836   CALCIUM 9.6 06/28/2020 0836   PROT 7.0 06/28/2020 0836   ALBUMIN 3.9 06/28/2020 0836   AST 24 06/28/2020 0836    ALT 39 06/28/2020 0836   ALKPHOS 51 06/28/2020 0836   BILITOT 1.4 (H) 06/28/2020 0836   GFRNONAA >60 06/28/2020 0836   GFRAA >60 06/14/2020 0828   Lab Results  Component Value Date   WBC 4.5 06/28/2020   NEUTROABS 3.0 06/28/2020   HGB 15.3 06/28/2020   HCT 43.9 06/28/2020   MCV 85.6 06/28/2020   PLT 171 06/28/2020    Imaging:  [facility] Clinician Interpretation: I have personally reviewed the CNS images as listed.  My interpretation, in the context of the patient's clinical presentation, is stable disease  MR BRAIN W WO CONTRAST  Result Date: 08/31/2020 CLINICAL DATA:  Brain/CNS neoplasm, assess treatment response. EXAM: MRI HEAD WITHOUT AND WITH CONTRAST TECHNIQUE: Multiplanar, multiecho pulse sequences of the brain and surrounding structures were obtained without and with intravenous contrast. CONTRAST:  61mL MULTIHANCE GADOBENATE DIMEGLUMINE 529 MG/ML IV SOLN COMPARISON:  MRI of the brain July 27, 2020. FINDINGS: Brain: Redemonstrated large necrotic lesion with irregular peripheral contrast enhancement within the left temporal lobe, measuring approximately 5.3 x 5.2 x 4.5 cm, similar to prior MRI. Stable T2 hyperintensity within the white  matter surrounding the lesion and extending into the left lenticular capsular region and thalamus with stable 6 mm rightward midline shift. No new lesion identified. No acute infarct, hemorrhage, hydrocephalus or extra-axial fluid collection. Vascular: Normal flow voids. Skull and upper cervical spine: Postsurgical changes from left temporal craniotomy. Marrow signal characteristics are otherwise maintained. Sinuses/Orbits: Negative. Other: None. IMPRESSION: Stable appearance of large necrotic lesion within the left temporal lobe. Unchanged perilesional T2 hyperintensity extending into the left lenticular capsular region and thalamus with stable 6 mm rightward midline shift. Electronically Signed   By: Pedro Earls M.D.   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  His complaints are generalized and may be related to treatment burden; he may have been poorly tolerant of higher dose Temodar.  Brain MRI demonstrates overall stable findings compared to last month's progression.  Labs unfortunately demonstrate significant thrombocytopenia, as well as recurrence of elevated liver enzymes.  We recommended deferring further chemotherapy until cytopenias resolve.  We will ask him to return in 1 week for lab appointment.  Could consider transition to metronomic daily Temodar once safe to resume, given tolerability issues.   Chemotherapy should be held for the following:  ANC less than 1,000  Platelets less than 100,000  LFT or creatinine greater than 2x ULN  If clinical concerns/contraindications develop  We recommended decreasing decadron to 39m daily given cushingoid appearance today, if tolerated.  Can also give consideration for avastin in near future if steroid sparing effect desired, especially given large burden of enhancing disease within dominant hemisphere.  For depression symptoms, will initiate zoloft 240mdaily.  We ask that Christopher Popeturn in 1 week for labs, we will give him a call to review numbers and iron out future plans for treatment and follow up.  All questions were answered. The patient knows to call the clinic with any problems, questions or concerns. No barriers to learning were detected.  I have spent a total of 40 minutes of face-to-face and non-face-to-face time, excluding clinical staff time, preparing to see patient, ordering tests and/or medications, counseling the patient, and independently interpreting results and communicating results to the patient/family/caregiver    ZaVentura SellersMD Medical Director of  Neuro-[facility] We[facility]eney2/13/21 12:02 PM

## 2020-09-04 ENCOUNTER — Encounter: Payer: Self-pay | Admitting: Internal Medicine

## 2020-09-06 ENCOUNTER — Other Ambulatory Visit: Payer: Self-pay | Admitting: *Deleted

## 2020-09-06 MED ORDER — DEXAMETHASONE 4 MG PO TABS
2.0000 mg | ORAL_TABLET | Freq: Every day | ORAL | 0 refills | Status: DC
Start: 2020-09-06 — End: 2020-09-24

## 2020-09-10 ENCOUNTER — Other Ambulatory Visit: Payer: Self-pay | Admitting: Internal Medicine

## 2020-09-10 ENCOUNTER — Inpatient Hospital Stay: Payer: 59

## 2020-09-10 ENCOUNTER — Telehealth: Payer: Self-pay | Admitting: Internal Medicine

## 2020-09-10 ENCOUNTER — Telehealth: Payer: Self-pay

## 2020-09-10 ENCOUNTER — Other Ambulatory Visit: Payer: Self-pay | Admitting: *Deleted

## 2020-09-10 ENCOUNTER — Other Ambulatory Visit: Payer: Self-pay

## 2020-09-10 DIAGNOSIS — C719 Malignant neoplasm of brain, unspecified: Secondary | ICD-10-CM

## 2020-09-10 DIAGNOSIS — D696 Thrombocytopenia, unspecified: Secondary | ICD-10-CM

## 2020-09-10 LAB — COMPREHENSIVE METABOLIC PANEL
ALT: 75 U/L — ABNORMAL HIGH (ref 0–44)
AST: 23 U/L (ref 15–41)
Albumin: 3.2 g/dL — ABNORMAL LOW (ref 3.5–5.0)
Alkaline Phosphatase: 56 U/L (ref 38–126)
Anion gap: 8 (ref 5–15)
BUN: 21 mg/dL — ABNORMAL HIGH (ref 6–20)
CO2: 27 mmol/L (ref 22–32)
Calcium: 8.9 mg/dL (ref 8.9–10.3)
Chloride: 106 mmol/L (ref 98–111)
Creatinine, Ser: 1.04 mg/dL (ref 0.61–1.24)
GFR, Estimated: >60 mL/min (ref >60)
Glucose, Bld: 115 mg/dL — ABNORMAL HIGH (ref 70–99)
Potassium: 3.9 mmol/L (ref 3.5–5.1)
Sodium: 141 mmol/L (ref 135–145)
Total Bilirubin: 1.4 mg/dL — ABNORMAL HIGH (ref 0.3–1.2)
Total Protein: 6.6 g/dL (ref 6.5–8.1)

## 2020-09-10 LAB — CBC WITH DIFFERENTIAL/PLATELET
Abs Immature Granulocytes: 0.44 10*3/uL — ABNORMAL HIGH (ref 0.00–0.07)
Basophils Absolute: 0 10*3/uL (ref 0.0–0.1)
Basophils Relative: 1 %
Eosinophils Absolute: 0 10*3/uL (ref 0.0–0.5)
Eosinophils Relative: 0 %
HCT: 43.8 % (ref 39.0–52.0)
Hemoglobin: 15.3 g/dL (ref 13.0–17.0)
Immature Granulocytes: 6 %
Lymphocytes Relative: 6 %
Lymphs Abs: 0.5 10*3/uL — ABNORMAL LOW (ref 0.7–4.0)
MCH: 31.8 pg (ref 26.0–34.0)
MCHC: 34.9 g/dL (ref 30.0–36.0)
MCV: 91.1 fL (ref 80.0–100.0)
Monocytes Absolute: 0.7 10*3/uL (ref 0.1–1.0)
Monocytes Relative: 8 %
Neutro Abs: 6.2 10*3/uL (ref 1.7–7.7)
Neutrophils Relative %: 79 %
Platelets: 7 10*3/uL — CL (ref 150–400)
RBC: 4.81 MIL/uL (ref 4.22–5.81)
RDW: 13.4 % (ref 11.5–15.5)
WBC: 7.8 10*3/uL (ref 4.0–10.5)
nRBC: 0.4 % — ABNORMAL HIGH (ref 0.0–0.2)

## 2020-09-10 LAB — TYPE AND SCREEN
ABO/RH(D): A POS
Antibody Screen: NEGATIVE

## 2020-09-10 MED ORDER — SODIUM CHLORIDE 0.9% IV SOLUTION
250.0000 mL | Freq: Once | INTRAVENOUS | Status: AC
  Administered 2020-09-10: 250 mL via INTRAVENOUS
  Filled 2020-09-10: qty 250

## 2020-09-10 NOTE — Progress Notes (Signed)
Pt. stable for discharge. Left via ambulation, no respiratory distress noted. Pt. escorted by RN to car.

## 2020-09-10 NOTE — Patient Instructions (Signed)
Thrombocytopenia Thrombocytopenia means that you have a low number of platelets in your blood. Platelets are tiny cells in the blood. When you bleed, they clump together at the cut or injury to stop the bleeding. This is called blood clotting. If you do not have enough platelets, it can cause bleeding problems. Some cases of this condition are mild while others are more severe. What are the causes? This condition may be caused by:  Your body not making enough platelets. This may be caused by: ? Your bone marrow not making blood cells (aplastic anemia). ? Cancer in the bone marrow. ? Certain medicines. ? Infection in the bone marrow. ? Drinking a lot of alcohol.  Your body destroying platelets too quickly. This may be caused by: ? Certain immune diseases. ? Certain medicines. ? Certain blood clotting disorders. ? Certain disorders that are passed from parent to child (inherited). ? Certain bleeding disorders. ? Pregnancy. ? Having a spleen that is larger than normal. What are the signs or symptoms?  Bleeding that is not normal.  Nosebleeds.  Heavy menstrual periods.  Blood in the pee (urine) or poop (stool).  A purple-like color to the skin (purpura).  Bruising.  A rash that looks like pinpoint, purple-red spots (petechiae). How is this treated?  Treatment of another condition that is causing the low platelet count.  Medicines to help protect your platelets from being destroyed.  A replacement (transfusion) of platelets to stop or prevent bleeding.  Surgery to remove the spleen. Follow these instructions at home: Activity  Avoid activities that could cause you to get hurt or bruised. Follow instructions about how to prevent falls.  Take care not to cut yourself: ? When you shave. ? When you use scissors, needles, knives, or other tools.  Take care not to burn yourself: ? When you use an iron. ? When you cook. General instructions   Check your skin and the  inside of your mouth for bruises or blood as told by your doctor.  Check to see if there is blood in your spit (sputum), pee, and poop. Do this as told by your doctor.  Do not drink alcohol.  Take over-the-counter and prescription medicines only as told by your doctor.  Do not take any medicines that have aspirin or NSAIDs in them. These medicines can thin your blood and cause you to bleed.  Tell all of your doctors that you have this condition. Be sure to tell your dentist and eye doctor too. Contact a doctor if:  You have bruises and you do not know why. Get help right away if:  You are bleeding anywhere on your body.  You have blood in your spit, pee, or poop. Summary  Thrombocytopenia means that you have a low number of platelets in your blood.  Platelets are needed for blood clotting.  Symptoms of this condition include bleeding that is not normal, and bruising.  Take care not to cut or burn yourself. This information is not intended to replace advice given to you by your health care provider. Make sure you discuss any questions you have with your health care provider. Document Revised: 06/10/2018 Document Reviewed: 06/10/2018 Elsevier Patient Education  [place].   Blood Transfusion, Adult, Care After This sheet gives you information about how to care for yourself after your procedure. Your doctor may also give you more specific instructions. If you have problems or questions, contact your doctor. What can I expect after the procedure? After the procedure,  it is common to have:  Bruising and soreness at the IV site.  A fever or chills on the day of the procedure. This may be your body's response to the new blood cells received.  A headache. Follow these instructions at home: Insertion site care      Follow instructions from your doctor about how to take care of your insertion site. This is where an IV tube was put into your vein. Make sure you: ? Wash  your hands with soap and water before and after you change your bandage (dressing). If you cannot use soap and water, use hand sanitizer. ? Change your bandage as told by your doctor.  Check your insertion site every day for signs of infection. Check for: ? Redness, swelling, or pain. ? Bleeding from the site. ? Warmth. ? Pus or a bad smell. General instructions  Take over-the-counter and prescription medicines only as told by your doctor.  Rest as told by your doctor.  Go back to your normal activities as told by your doctor.  Keep all follow-up visits as told by your doctor. This is important. Contact a doctor if:  You have itching or red, swollen areas of skin (hives).  You feel worried or nervous (anxious).  You feel weak after doing your normal activities.  You have redness, swelling, warmth, or pain around the insertion site.  You have blood coming from the insertion site, and the blood does not stop with pressure.  You have pus or a bad smell coming from the insertion site. Get help right away if:  You have signs of a serious reaction. This may be coming from an allergy or the body's defense system (immune system). Signs include: ? Trouble breathing or shortness of breath. ? Swelling of the face or feeling warm (flushed). ? Fever or chills. ? Head, chest, or back pain. ? Dark pee (urine) or blood in the pee. ? Widespread rash. ? Fast heartbeat. ? Feeling dizzy or light-headed. You may receive your blood transfusion in an outpatient setting. If so, you will be told whom to contact to report any reactions. These symptoms may be an emergency. Do not wait to see if the symptoms will go away. Get medical help right away. Call your local emergency services (911 in the [place] Do not drive yourself to the hospital. Summary  Bruising and soreness at the IV site are common.  Check your insertion site every day for signs of infection.  Rest as told by your doctor. Go back  to your normal activities as told by your doctor.  Get help right away if you have signs of a serious reaction. This information is not intended to replace advice given to you by your health care provider. Make sure you discuss any questions you have with your health care provider. Document Revised: 03/03/2019 Document Reviewed: 03/03/2019 Elsevier Patient Education  [place].

## 2020-09-10 NOTE — Telephone Encounter (Signed)
Contacted patient to verify telephone visit for pre reg °

## 2020-09-10 NOTE — Telephone Encounter (Signed)
CRITICAL VALUE STICKER  CRITICAL VALUE: Platelets = 7  RECEIVER (on-site recipient of call): Yetta Glassman, CMA  DATE & TIME NOTIFIED: 09/10/20 at 11:49am  MESSENGER (representative from lab): Rosann Auerbach  MD NOTIFIED: Dr. [name]  TIME OF NOTIFICATION: 09/10/20 at 11:55am  RESPONSE: Notification given directly to provider for follow-up with pt.

## 2020-09-11 ENCOUNTER — Inpatient Hospital Stay (HOSPITAL_BASED_OUTPATIENT_CLINIC_OR_DEPARTMENT_OTHER): Payer: 59 | Admitting: Internal Medicine

## 2020-09-11 DIAGNOSIS — C719 Malignant neoplasm of brain, unspecified: Secondary | ICD-10-CM

## 2020-09-11 LAB — BPAM PLATELET PHERESIS
Blood Product Expiration Date: 202112202359
ISSUE DATE / TIME: 202112201438
Unit Type and Rh: 6200

## 2020-09-11 LAB — PREPARE PLATELET PHERESIS: Unit division: 0

## 2020-09-11 NOTE — Progress Notes (Signed)
I connected with Christopher Tucker on 09/11/20 at 12:30 PM EST by telephone visit and verified that I am speaking with the correct person using two identifiers.  I discussed the limitations, risks, security and privacy concerns of performing an evaluation and management service by telemedicine and the availability of in-person appointments. I also discussed with the patient that there may be a patient responsible charge related to this service. The patient expressed understanding and agreed to proceed.  Other persons participating in the visit and their role in the encounter:  spouse  Patient's location:  Home  Provider's location:  Office  Chief Complaint:  Anaplastic astrocytoma, IDH-wildtype ([place])  History of Present Ilness: Christopher Tucker describes continued problems with speech output, short term memory, mood lability as discussed prior.  He does not feel as if these symptoms have worsened appreciably since decreasing the decadron down to 90m daily.  Denies any recent seizures.  No issues with the platelet transfusion yesterday. Observations: Language impairment and cognition at baseline Assessment and Plan: Anaplastic astrocytoma, IDH-wildtype ([facility]) Thrombocytopenia  Lab Results  Component Value Date   WBC 7.8 09/10/2020   HGB 15.3 09/10/2020   HCT 43.8 09/10/2020   MCV 91.1 09/10/2020   PLT 7 (LL) 09/10/2020   [facility] tolerated transfusion well yesterday.  Fortunately no other cell lines are involved and we anticipate continued recovery of counts given rest from chemotherapy. We discussed plans moving forward for possible utilization of Optune, or metronomic TMZ depending on what labs demonstrate early next week.  Follow Up Instructions: RTC on 12/27 with labs for evaluation  I discussed the assessment and treatment plan with the patient.  The patient was provided an opportunity to ask questions and all were answered.  The patient agreed with the plan and demonstrated understanding of the  instructions.    The patient was advised to call back or seek an in-person evaluation if the symptoms worsen or if the condition fails to improve as anticipated.  I provided 5-10 minutes of non-face-to-face time during this enocunter.  ZVentura Sellers MD   I provided 15 minutes of non face-to-face telephone visit time during this encounter, and > 50% was spent counseling as documented under my assessment & plan.

## 2020-09-12 ENCOUNTER — Telehealth: Payer: Self-pay | Admitting: Internal Medicine

## 2020-09-12 NOTE — Telephone Encounter (Signed)
Scheduled follow-up appointment. Patient's wife is aware.

## 2020-09-17 ENCOUNTER — Inpatient Hospital Stay: Payer: 59

## 2020-09-17 ENCOUNTER — Other Ambulatory Visit: Payer: Self-pay

## 2020-09-17 ENCOUNTER — Inpatient Hospital Stay (HOSPITAL_BASED_OUTPATIENT_CLINIC_OR_DEPARTMENT_OTHER): Payer: 59 | Admitting: Internal Medicine

## 2020-09-17 VITALS — BP 133/93 | HR 86 | Temp 99.4°F | Resp 18 | Ht 71.0 in | Wt 187.0 lb

## 2020-09-17 DIAGNOSIS — C719 Malignant neoplasm of brain, unspecified: Secondary | ICD-10-CM

## 2020-09-17 LAB — CBC WITH DIFFERENTIAL/PLATELET
Abs Immature Granulocytes: 0.3 10*3/uL — ABNORMAL HIGH (ref 0.00–0.07)
Basophils Absolute: 0.1 10*3/uL (ref 0.0–0.1)
Basophils Relative: 1 %
Eosinophils Absolute: 0 10*3/uL (ref 0.0–0.5)
Eosinophils Relative: 0 %
HCT: 41.9 % (ref 39.0–52.0)
Hemoglobin: 14.6 g/dL (ref 13.0–17.0)
Immature Granulocytes: 5 %
Lymphocytes Relative: 13 %
Lymphs Abs: 0.8 10*3/uL (ref 0.7–4.0)
MCH: 31.3 pg (ref 26.0–34.0)
MCHC: 34.8 g/dL (ref 30.0–36.0)
MCV: 89.7 fL (ref 80.0–100.0)
Monocytes Absolute: 0.5 10*3/uL (ref 0.1–1.0)
Monocytes Relative: 8 %
Neutro Abs: 4.1 10*3/uL (ref 1.7–7.7)
Neutrophils Relative %: 73 %
Platelets: 15 10*3/uL — ABNORMAL LOW (ref 150–400)
RBC: 4.67 MIL/uL (ref 4.22–5.81)
RDW: 14.3 % (ref 11.5–15.5)
WBC: 5.7 10*3/uL (ref 4.0–10.5)
nRBC: 0.5 % — ABNORMAL HIGH (ref 0.0–0.2)

## 2020-09-17 LAB — CMP (CANCER CENTER ONLY)
ALT: 50 U/L — ABNORMAL HIGH (ref 0–44)
AST: 24 U/L (ref 15–41)
Albumin: 3.6 g/dL (ref 3.5–5.0)
Alkaline Phosphatase: 52 U/L (ref 38–126)
Anion gap: 11 (ref 5–15)
BUN: 23 mg/dL — ABNORMAL HIGH (ref 6–20)
CO2: 24 mmol/L (ref 22–32)
Calcium: 9 mg/dL (ref 8.9–10.3)
Chloride: 106 mmol/L (ref 98–111)
Creatinine: 1.07 mg/dL (ref 0.61–1.24)
GFR, Estimated: >60 mL/min (ref >60)
Glucose, Bld: 116 mg/dL — ABNORMAL HIGH (ref 70–99)
Potassium: 3.6 mmol/L (ref 3.5–5.1)
Sodium: 141 mmol/L (ref 135–145)
Total Bilirubin: 1.2 mg/dL (ref 0.3–1.2)
Total Protein: 6.9 g/dL (ref 6.5–8.1)

## 2020-09-17 NOTE — Progress Notes (Signed)
[facility] at [place], [place] [address] [phone]   Interval Evaluation  Date of Service: 09/17/20 Patient Name: Christopher Tucker Patient MRN: [id number] Patient DOB: [birth year] Provider: [name], MD  Identifying Statement:  Christopher Tucker is a 59 y.o. male with left temporal anaplastic astrocytoma   Oncologic History: 04/25/20: Craniotomy, open biopsy by Dr. Kathyrn Sheriff  Biomarkers:  MGMT Unknown.  IDH 1/2 Wild type.  EGFR Unknown  TERT "Mutated   Interval History:  Christopher Tucker presents today for follow up after repeat of labs.  No clinical changes reported today.  He again is less engaged with activities at home, with family.  Continues on 2m daily decadron.  Right sided weakness persists as prior, independent still with gait. Denies seizures or headaches.     H+P (05/10/20) Patient presented to medical attention in early August with several months history of sensory episodes.  They are described as "numbness marching down right arm also involving the leg, lasting for several minutes".  There is no post event weakness or confusion.  No known history of seizures.  In recent weeks he has also experienced some difficulty with expressive language, finding particular words and maintaining high fluency.  Since starting Keppra post-operatively he has not had any recurrence of these sensory events.  He has weaned off dexamethasone.  We are still awaiting finalized pathology results.    Medications: Current Outpatient Medications on File Prior to Visit  Medication Sig Dispense Refill  . dexamethasone (DECADRON) 4 MG tablet Take 0.5 tablets (2 mg total) by mouth daily. 30 tablet 0  . ibuprofen (ADVIL) 200 MG tablet Take 400 mg by mouth 2 (two) times daily.    .Marland KitchenlevETIRAcetam (KEPPRA) 500 MG tablet Take 1 tablet (500 mg total) by mouth 2 (two) times daily. 60 tablet 1  . ondansetron (ZOFRAN) 8 MG tablet Take 1 tablet (8 mg total) by  mouth 2 (two) times daily as needed (nausea and vomiting). May take 30-60 minutes prior to Temodar administration if nausea/vomiting occurs. 30 tablet 1  . sertraline (ZOLOFT) 25 MG tablet Take 1 tablet (25 mg total) by mouth daily. 30 tablet 3  . simvastatin (ZOCOR) 40 MG tablet Take 40 mg by mouth at bedtime.     No current facility-administered medications on file prior to visit.    Allergies: No Known Allergies Past Medical History:  Past Medical History:  Diagnosis Date  . High cholesterol   . History of kidney stones    Past Surgical History:  Past Surgical History:  Procedure Laterality Date  . APPLICATION OF CRANIAL NAVIGATION Left 04/25/2020   Procedure: APPLICATION OF CRANIAL NAVIGATION;  Surgeon: NConsuella Lose MD;  Location[facility]  Service: Neurosurgery;  Laterality: Left;  . CRANIOTOMY Left 04/25/2020   Procedure: STEREOTACTIC LEFT TEMPORAL CRANIOTOMY FOR TUMOR;  Surgeon: NConsuella Lose MD;  [facility]  Service: Neurosurgery;  Laterality: Left;  . HERNIA REPAIR Left    groin    Social History:  Social History   Socioeconomic History  . Marital status: Married    Spouse name: Not on file  . Number of children: Not on file  . Years of education: Not on file  . Highest education level: Not on file  Occupational History  . Not on file  Tobacco Use  . Smoking status: Never Smoker  . Smokeless tobacco: Never Used  Substance and Sexual Activity  . Alcohol use: Yes    Alcohol/week:  6.0 standard drinks    Types: 3 Glasses of wine, 3 Cans of beer per week  . Drug use: Not Currently  . Sexual activity: Not on file  Other Topics Concern  . Not on file  Social History Narrative  . Not on file   Social Determinants of Health   Financial Resource Strain: Not on file  Food Insecurity: Not on file  Transportation Needs: Not on file  Physical Activity: Not on file  Stress: Not on file  Social Connections: Not on file  Intimate Partner Violence: Not on  file   Family History: No family history on file.  Review of Systems: Constitutional: Doesn't report fevers, chills or abnormal weight loss Eyes: Doesn't report blurriness of vision Ears, nose, mouth, throat, and face: Doesn't report sore throat Respiratory: Doesn't report cough, dyspnea or wheezes Cardiovascular: Doesn't report palpitation, chest discomfort  Gastrointestinal:  Doesn't report nausea, constipation, diarrhea GU: Doesn't report incontinence Skin: Doesn't report skin rashes Neurological: Per HPI Musculoskeletal: Doesn't report joint pain Behavioral/Psych: Doesn't report anxiety  Physical Exam: Vitals:   09/17/20 0857  BP: (!) 133/93  Pulse: 86  Resp: 18  Temp: 99.4 F (37.4 C)  SpO2: 99%   KPS: 70. General: cushingoid appearance Head: Normal EENT: No conjunctival injection or scleral icterus.  Lungs: Resp effort normal Cardiac: Regular rate Abdomen: Non-distended abdomen Skin: No rashes cyanosis or petechiae. Extremities: No clubbing or edema  Neurologic Exam: Mental Status: Awake, alert, attentive to examiner. Oriented to self and environment. Language c/w transcortical expressive dyshpasia Cranial Nerves: Visual acuity is grossly normal. Visual fields are full. Extra-ocular movements intact. No ptosis. Face is symmetric Motor: Tone and bulk are normal. Power is full in both arms and legs. Reflexes are symmetric, no pathologic reflexes present.  Sensory: Intact to light touch Gait: Dystaxic  Labs: I have reviewed the data as listed    Component Value Date/Time   NA 141 09/10/2020 1133   K 3.9 09/10/2020 1133   CL 106 09/10/2020 1133   CO2 27 09/10/2020 1133   GLUCOSE 115 (H) 09/10/2020 1133   BUN 21 (H) 09/10/2020 1133   CREATININE 1.04 09/10/2020 1133   CREATININE 0.90 06/28/2020 0836   CALCIUM 8.9 09/10/2020 1133   PROT 6.6 09/10/2020 1133   ALBUMIN 3.2 (L) 09/10/2020 1133   AST 23 09/10/2020 1133   AST 24 06/28/2020 0836   ALT 75 (H)  09/10/2020 1133   ALT 39 06/28/2020 0836   ALKPHOS 56 09/10/2020 1133   BILITOT 1.4 (H) 09/10/2020 1133   BILITOT 1.4 (H) 06/28/2020 0836   GFRNONAA >60 09/10/2020 1133   GFRNONAA >60 06/28/2020 0836   GFRAA >60 06/14/2020 0828   Lab Results  Component Value Date   WBC 5.7 09/17/2020   NEUTROABS 4.1 09/17/2020   HGB 14.6 09/17/2020   HCT 41.9 09/17/2020   MCV 89.7 09/17/2020   PLT 15 (L) 09/17/2020    Imaging:  [facility] Clinician Interpretation: I have personally reviewed the CNS images as listed.  My interpretation, in the context of the patient's clinical presentation, is stable disease  MR BRAIN W WO CONTRAST  Result Date: 08/31/2020 CLINICAL DATA:  Brain/CNS neoplasm, assess treatment response. EXAM: MRI HEAD WITHOUT AND WITH CONTRAST TECHNIQUE: Multiplanar, multiecho pulse sequences of the brain and surrounding structures were obtained without and with intravenous contrast. CONTRAST:  68m MULTIHANCE GADOBENATE DIMEGLUMINE 529 MG/ML IV SOLN COMPARISON:  MRI of the brain July 27, 2020. FINDINGS: Brain: Redemonstrated large necrotic lesion with irregular  peripheral contrast enhancement within the left temporal lobe, measuring approximately 5.3 x 5.2 x 4.5 cm, similar to prior MRI. Stable T2 hyperintensity within the white matter surrounding the lesion and extending into the left lenticular capsular region and thalamus with stable 6 mm rightward midline shift. No new lesion identified. No acute infarct, hemorrhage, hydrocephalus or extra-axial fluid collection. Vascular: Normal flow voids. Skull and upper cervical spine: Postsurgical changes from left temporal craniotomy. Marrow signal characteristics are otherwise maintained. Sinuses/Orbits: Negative. Other: None. IMPRESSION: Stable appearance of large necrotic lesion within the left temporal lobe. Unchanged perilesional T2 hyperintensity extending into the left lenticular capsular region and thalamus with stable 6 mm rightward midline  shift. Electronically Signed   By: Pedro Earls M.D.   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  Labs demonstrate modest improvement in thrombocytopenia seen last week.    We again recommended deferring further chemotherapy until cytopenias resolve.  We offered tumor-treating fields device as a "bridge" therapy or as substitute for systemic therapy.  Will still plan transition to metronomic daily Temodar once safe to resume, given tolerability issues.   Chemotherapy should be held for the following:  ANC less than 1,000  Platelets less than 100,000  LFT or creatinine greater than 2x ULN  If clinical concerns/contraindications develop  We recommended continuing decadron 8m daily given ongoing deficits and cushingoid symptoms at 424mdaily dose level.  For depression symptoms, will continue zoloft 2510maily.  Can consider trial of Ritalin for poor energy, high sleep volume.  We ask that MarMarie Chowturn in 2 week with labs for evaluation.  He will let us [place] if he wants to proceed with Novo-TTF or oral stimulant, as discussed.  All questions were answered. The patient knows to call the clinic with any problems, questions or concerns. No barriers to learning were detected.  I have spent a total of 30 minutes of face-to-face and non-face-to-face time, excluding clinical staff time, preparing to see patient, ordering tests and/or medications, counseling the patient, and independently interpreting results and communicating results to the patient/family/caregiver    ZacVentura SellersD Medical Director of Neuro-Oncology [facility] [place]/27/21 8:57 AM

## 2020-09-18 ENCOUNTER — Telehealth: Payer: Self-pay | Admitting: Internal Medicine

## 2020-09-18 NOTE — Telephone Encounter (Signed)
Scheduled follow-up appointment per 12/27 los. Patient's wife is aware.

## 2020-09-23 ENCOUNTER — Other Ambulatory Visit: Payer: Self-pay | Admitting: Internal Medicine

## 2020-10-01 ENCOUNTER — Other Ambulatory Visit: Payer: Self-pay

## 2020-10-01 ENCOUNTER — Inpatient Hospital Stay: Payer: 59 | Attending: Neurosurgery | Admitting: Internal Medicine

## 2020-10-01 ENCOUNTER — Inpatient Hospital Stay: Payer: 59

## 2020-10-01 VITALS — BP 132/98 | HR 67 | Temp 97.8°F | Resp 19 | Ht 71.0 in | Wt 189.5 lb

## 2020-10-01 DIAGNOSIS — D696 Thrombocytopenia, unspecified: Secondary | ICD-10-CM

## 2020-10-01 DIAGNOSIS — Z5112 Encounter for antineoplastic immunotherapy: Secondary | ICD-10-CM | POA: Insufficient documentation

## 2020-10-01 DIAGNOSIS — Z7289 Other problems related to lifestyle: Secondary | ICD-10-CM | POA: Diagnosis not present

## 2020-10-01 DIAGNOSIS — Z87442 Personal history of urinary calculi: Secondary | ICD-10-CM | POA: Insufficient documentation

## 2020-10-01 DIAGNOSIS — C719 Malignant neoplasm of brain, unspecified: Secondary | ICD-10-CM

## 2020-10-01 DIAGNOSIS — Z79899 Other long term (current) drug therapy: Secondary | ICD-10-CM | POA: Insufficient documentation

## 2020-10-01 DIAGNOSIS — Z7952 Long term (current) use of systemic steroids: Secondary | ICD-10-CM | POA: Insufficient documentation

## 2020-10-01 DIAGNOSIS — Z923 Personal history of irradiation: Secondary | ICD-10-CM | POA: Diagnosis not present

## 2020-10-01 DIAGNOSIS — F32A Depression, unspecified: Secondary | ICD-10-CM | POA: Diagnosis not present

## 2020-10-01 LAB — COMPREHENSIVE METABOLIC PANEL
ALT: 72 U/L — ABNORMAL HIGH (ref 0–44)
AST: 22 U/L (ref 15–41)
Albumin: 3.5 g/dL (ref 3.5–5.0)
Alkaline Phosphatase: 55 U/L (ref 38–126)
Anion gap: 10 (ref 5–15)
BUN: 18 mg/dL (ref 6–20)
CO2: 23 mmol/L (ref 22–32)
Calcium: 9.3 mg/dL (ref 8.9–10.3)
Chloride: 108 mmol/L (ref 98–111)
Creatinine, Ser: 0.89 mg/dL (ref 0.61–1.24)
GFR, Estimated: >60 mL/min (ref >60)
Glucose, Bld: 125 mg/dL — ABNORMAL HIGH (ref 70–99)
Potassium: 3.9 mmol/L (ref 3.5–5.1)
Sodium: 141 mmol/L (ref 135–145)
Total Bilirubin: 1.2 mg/dL (ref 0.3–1.2)
Total Protein: 7.2 g/dL (ref 6.5–8.1)

## 2020-10-01 LAB — CBC WITH DIFFERENTIAL/PLATELET
Abs Immature Granulocytes: 0.33 10*3/uL — ABNORMAL HIGH (ref 0.00–0.07)
Basophils Absolute: 0.1 10*3/uL (ref 0.0–0.1)
Basophils Relative: 1 %
Eosinophils Absolute: 0 10*3/uL (ref 0.0–0.5)
Eosinophils Relative: 0 %
HCT: 40.3 % (ref 39.0–52.0)
Hemoglobin: 14.3 g/dL (ref 13.0–17.0)
Immature Granulocytes: 4 %
Lymphocytes Relative: 8 %
Lymphs Abs: 0.6 10*3/uL — ABNORMAL LOW (ref 0.7–4.0)
MCH: 32.2 pg (ref 26.0–34.0)
MCHC: 35.5 g/dL (ref 30.0–36.0)
MCV: 90.8 fL (ref 80.0–100.0)
Monocytes Absolute: 0.7 10*3/uL (ref 0.1–1.0)
Monocytes Relative: 9 %
Neutro Abs: 6.2 10*3/uL (ref 1.7–7.7)
Neutrophils Relative %: 78 %
Platelets: 119 10*3/uL — ABNORMAL LOW (ref 150–400)
RBC: 4.44 MIL/uL (ref 4.22–5.81)
RDW: 15.4 % (ref 11.5–15.5)
WBC: 8 10*3/uL (ref 4.0–10.5)
nRBC: 0.3 % — ABNORMAL HIGH (ref 0.0–0.2)

## 2020-10-01 LAB — PREPARE PLATELET PHERESIS: Unit division: 0

## 2020-10-01 LAB — BPAM PLATELET PHERESIS
Blood Product Expiration Date: 202201132359
Unit Type and Rh: 7300

## 2020-10-01 NOTE — Progress Notes (Signed)
[facility] at [place], [place] [address] [phone]   Interval Evaluation  Date of Service: 10/01/20 Patient Name: Christopher Tucker Patient MRN: [id number] Patient DOB: September 26, 1960 Provider: [name], MD  Identifying Statement:  Christopher Tucker is a 60 y.o. male with left temporal anaplastic astrocytoma   Oncologic History: Oncology History  Glioblastoma, IDH-wildtype ([facility])  05/14/2020 Initial Diagnosis   Anaplastic astrocytoma, IDH-wildtype ([place])   05/21/2020 - 07/02/2020 Radiation Therapy   IMRT and concurrent Temodar with Dr. Lisbeth Renshaw   07/30/2020 -  Chemotherapy   The patient had dexamethasone (DECADRON) 4 MG tablet, 1 of 1 cycle, Start date: --, End date: -- temozolomide (TEMODAR) 5 MG capsule, 200 mg/m2/day = 410 mg, Oral, Daily, 1 of 1 cycle, Start date: --, End date: -- temozolomide (TEMODAR) 20 MG capsule, 200 mg/m2/day = 400 mg, Oral, Daily, 1 of 1 cycle, Start date: --, End date: -- temozolomide (TEMODAR) 100 MG capsule, 150 mg/m2/day = 300 mg, Oral, Daily, 1 of 1 cycle, Start date: 07/30/2020, End date: 08/04/2020 temozolomide (TEMODAR) 140 MG capsule, 200 mg/m2/day = 420 mg, Oral, Daily, 1 of 1 cycle, Start date: --, End date: --  for chemotherapy treatment.       Biomarkers:  MGMT Unknown.  IDH 1/2 Wild type.  EGFR Unknown  TERT "Mutated   Interval History:  Christopher Tucker presents today for follow up after further repeat of labs.  He and his wife describe notable decline since last visit two weeks prior.  His right side is weaker, and he requires assistance with ADLs such as dressing and bathing.  He is now unable to function effectively at work, and will be staying home for foreseeable future. Continues on 29m daily decadron.  Denies seizures or headaches.     H+P (05/10/20) Patient presented to medical attention in early August with several months history of sensory episodes.  They are described as "numbness  marching down right arm also involving the leg, lasting for several minutes".  There is no post event weakness or confusion.  No known history of seizures.  In recent weeks he has also experienced some difficulty with expressive language, finding particular words and maintaining high fluency.  Since starting Keppra post-operatively he has not had any recurrence of these sensory events.  He has weaned off dexamethasone.  We are still awaiting finalized pathology results.    Medications: Current Outpatient Medications on File Prior to Visit  Medication Sig Dispense Refill  . dexamethasone (DECADRON) 4 MG tablet TAKE 1 TABLET (4 MG TOTAL) BY MOUTH DAILY. 30 tablet 0  . ibuprofen (ADVIL) 200 MG tablet Take 400 mg by mouth 2 (two) times daily.    .Marland KitchenlevETIRAcetam (KEPPRA) 500 MG tablet Take 1 tablet (500 mg total) by mouth 2 (two) times daily. 60 tablet 1  . ondansetron (ZOFRAN) 8 MG tablet Take 1 tablet (8 mg total) by mouth 2 (two) times daily as needed (nausea and vomiting). May take 30-60 minutes prior to Temodar administration if nausea/vomiting occurs. 30 tablet 1  . sertraline (ZOLOFT) 25 MG tablet Take 1 tablet (25 mg total) by mouth daily. 30 tablet 3  . simvastatin (ZOCOR) 40 MG tablet Take 40 mg by mouth at bedtime.     No current facility-administered medications on file prior to visit.    Allergies: No Known Allergies Past Medical History:  Past Medical History:  Diagnosis Date  . High cholesterol   . History of kidney  stones    Past Surgical History:  Past Surgical History:  Procedure Laterality Date  . APPLICATION OF CRANIAL NAVIGATION Left 04/25/2020   Procedure: APPLICATION OF CRANIAL NAVIGATION;  Surgeon: Consuella Lose, MD;  Location: [facility];  Service: Neurosurgery;  Laterality: Left;  . CRANIOTOMY Left 04/25/2020   Procedure: STEREOTACTIC LEFT TEMPORAL CRANIOTOMY FOR TUMOR;  Surgeon: Consuella Lose, MD;  Location: [facility];  Service: Neurosurgery;  Laterality: Left;  .  HERNIA REPAIR Left    groin    Social History:  Social History   Socioeconomic History  . Marital status: Married    Spouse name: Not on file  . Number of children: Not on file  . Years of education: Not on file  . Highest education level: Not on file  Occupational History  . Not on file  Tobacco Use  . Smoking status: Never Smoker  . Smokeless tobacco: Never Used  Substance and Sexual Activity  . Alcohol use: Yes    Alcohol/week: 6.0 standard drinks    Types: 3 Glasses of wine, 3 Cans of beer per week  . Drug use: Not Currently  . Sexual activity: Not on file  Other Topics Concern  . Not on file  Social History Narrative  . Not on file   Social Determinants of Health   Financial Resource Strain: Not on file  Food Insecurity: Not on file  Transportation Needs: Not on file  Physical Activity: Not on file  Stress: Not on file  Social Connections: Not on file  Intimate Partner Violence: Not on file   Family History: No family history on file.  Review of Systems: Constitutional: Doesn't report fevers, chills or abnormal weight loss Eyes: Doesn't report blurriness of vision Ears, nose, mouth, throat, and face: Doesn't report sore throat Respiratory: Doesn't report cough, dyspnea or wheezes Cardiovascular: Doesn't report palpitation, chest discomfort  Gastrointestinal:  Doesn't report nausea, constipation, diarrhea GU: Doesn't report incontinence Skin: Doesn't report skin rashes Neurological: Per HPI Musculoskeletal: Doesn't report joint pain Behavioral/Psych: Doesn't report anxiety  Physical Exam: Vitals:   10/01/20 1149 10/01/20 1150  BP: (!) 136/102 (!) 132/98  Pulse: 67   Resp: 19   Temp: 97.8 F (36.6 C)   SpO2: 100%    KPS: 70. General: cushingoid appearance Head: Normal EENT: No conjunctival injection or scleral icterus.  Lungs: Resp effort normal Cardiac: Regular rate Abdomen: Non-distended abdomen Skin: No rashes cyanosis or  petechiae. Extremities: No clubbing or edema  Neurologic Exam: Mental Status: Awake, alert, attentive to examiner. Oriented to self and environment. Language c/w transcortical expressive dyshpasia Cranial Nerves: Visual acuity is grossly normal. Visual fields are full. Extra-ocular movements intact. No ptosis. Face is symmetric Motor: Tone and bulk are normal. 4+/5 in right arm and leg. Reflexes are symmetric, no pathologic reflexes present.  Sensory: Intact to light touch Gait: Dystaxic, hemiparetic  Labs: I have reviewed the data as listed    Component Value Date/Time   NA 141 09/17/2020 0823   K 3.6 09/17/2020 0823   CL 106 09/17/2020 0823   CO2 24 09/17/2020 0823   GLUCOSE 116 (H) 09/17/2020 0823   BUN 23 (H) 09/17/2020 0823   CREATININE 1.07 09/17/2020 0823   CALCIUM 9.0 09/17/2020 0823   PROT 6.9 09/17/2020 0823   ALBUMIN 3.6 09/17/2020 0823   AST 24 09/17/2020 0823   ALT 50 (H) 09/17/2020 0823   ALKPHOS 52 09/17/2020 0823   BILITOT 1.2 09/17/2020 0823   GFRNONAA >60 09/17/2020 0823   GFRAA >  60 06/14/2020 0828   Lab Results  Component Value Date   WBC 8.0 10/01/2020   NEUTROABS 6.2 10/01/2020   HGB 14.3 10/01/2020   HCT 40.3 10/01/2020   MCV 90.8 10/01/2020   PLT 119 (L) 10/01/2020    Assessment/Plan Glioblastoma, 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  Etiology of these changes is either tumor progression, or focal decompensation secondary to other medical process, or unwitnessed seizure(s).    We recommended ASAP MRI brain to evaluate suspected changes.  Given the high burden on enhancing disease already present, and significant cytopenias with oral chemo, we would likely recommend transition to IV avastin 22m/kg q2 weeks if any further progression is visualized.  If imaging is stable we will consider alternate etiologies to account for these changes.  Labs fortunately demonstrate significant improvement in recent  thrombocytopenia requiring platelet transfusion.    We recommended continuing decadron 242mdaily given clinical changes, and cushingoid symptoms at 69m33maily dose level.  For depression symptoms, will continue zoloft 47m12mily.  We will reach out to [employer] phone once MRI is obtained and treatment plan can be developed.  He and his wife are agreeable with this plan.  All questions were answered. The patient knows to call the clinic with any problems, questions or concerns. No barriers to learning were detected.  I have spent a total of 40 minutes of face-to-face and non-face-to-face time, excluding clinical staff time, preparing to see patient, ordering tests and/or medications, counseling the patient, and independently interpreting results and communicating results to the patient/family/caregiver    ZachVentura Sellers Medical Director of Neuro-Oncology [facility]orest10/22 11:59 AM

## 2020-10-04 ENCOUNTER — Other Ambulatory Visit: Payer: Self-pay | Admitting: Internal Medicine

## 2020-10-04 ENCOUNTER — Other Ambulatory Visit: Payer: Self-pay

## 2020-10-04 ENCOUNTER — Ambulatory Visit (HOSPITAL_COMMUNITY)
Admission: RE | Admit: 2020-10-04 | Discharge: 2020-10-04 | Disposition: A | Discharge status: Home / Self Care | Payer: 59 | Source: Ambulatory Visit | Attending: Internal Medicine | Admitting: Internal Medicine

## 2020-10-04 DIAGNOSIS — C719 Malignant neoplasm of brain, unspecified: Secondary | ICD-10-CM

## 2020-10-04 IMAGING — MR MR HEAD WO/W CM
16 of 17 series · 41 of 48 positions shown · IV contrast (gadavist)
Comparison: [DATE]

CLINICAL DATA: Glioblastoma follow-up

EXAM:
MRI HEAD WITHOUT AND WITH CONTRAST
TECHNIQUE: Multiplanar, multiecho pulse sequences of the brain and surrounding
structures were obtained without and with intravenous contrast.
CONTRAST:  8mL GADAVIST GADOBUTROL 1 MMOL/ML IV SOLN

[Series 5: DWI · axial · 3.0mm · 1.36mm/px · z∈[-51,+107]mm · 6 of 108 slices shown (1 of 4)]
[im 1/108]
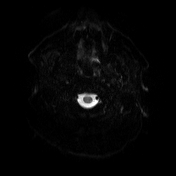
[im 22/108]
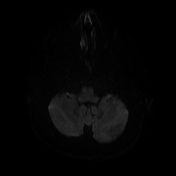
[im 43/108]
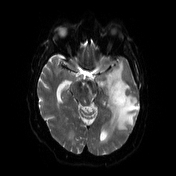
[im 65/108]
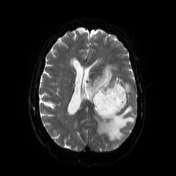
[im 86/108]
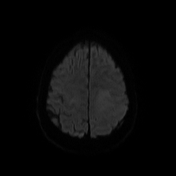
[im 108/108]
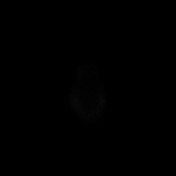

[Series 6: DWI · axial · 3.0mm · 1.36mm/px · z∈[-51,+107]mm · 2 of 54 slices shown (2 of 4)]
[im 1/54]
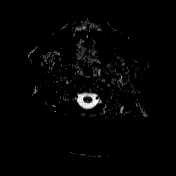
[im 54/54]
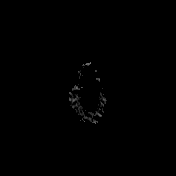

[Series 7: T1 · sagittal · 5.0mm · 0.75mm/px · 1 of 24 slices shown (1 of 4)]
[im 1/24]
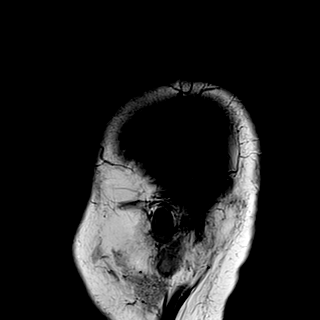

[Series 8: T2 · axial · 5.0mm · 0.62mm/px · 1 of 26 slices shown]
[im 1/26]
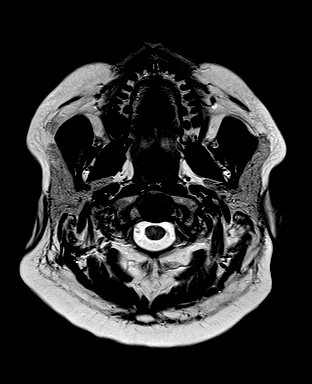

[Series 9: mip_images(sw) · axial · 24.0mm · 0.75mm/px · z∈[-44,+99]mm · 2 of 49 slices shown]
[im 1/49]
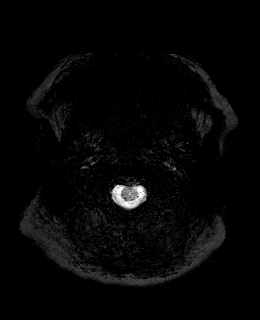
[im 49/49]
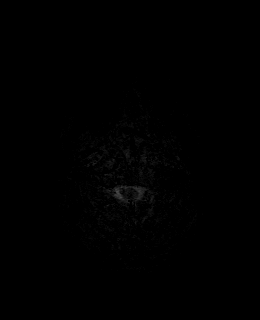

[Series 10: swi_images · axial · 3.0mm · 0.75mm/px · z∈[-54,+110]mm · 3 of 56 slices shown]
[im 1/56]
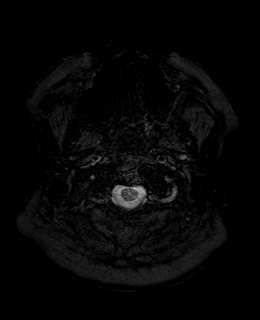
[im 28/56]
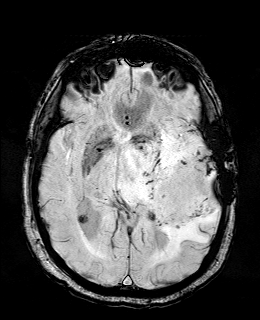
[im 56/56]
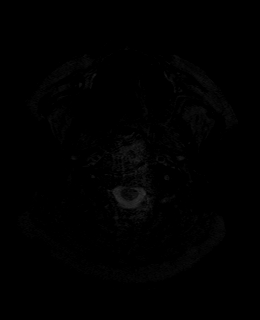

[Series 11: FLAIR · axial · 3.0mm · 0.75mm/px · z∈[-48,+104]mm · 2 of 52 slices shown]
[im 1/52]
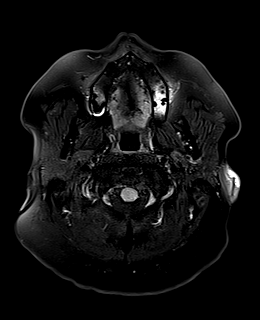
[im 52/52]
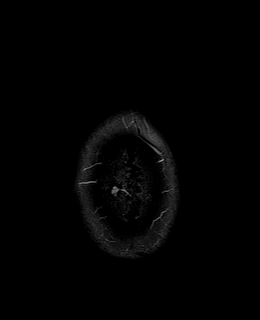

[Series 12: T1 · axial · 1.0mm · 0.94mm/px · z∈[-52,+106]mm · 7 of 160 slices shown (2 of 4)]
[im 1/160]
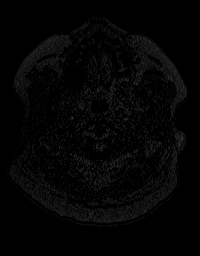
[im 27/160]
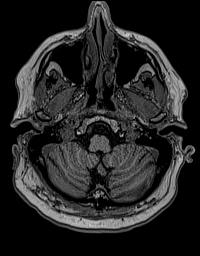
[im 54/160]
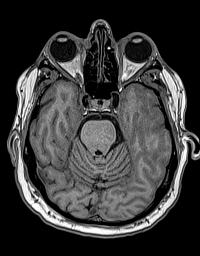
[im 80/160]
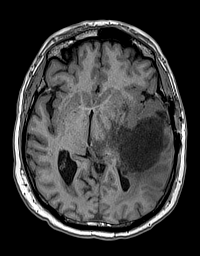
[im 107/160]
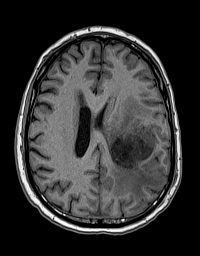
[im 133/160]
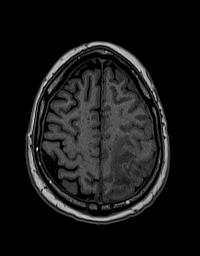
[im 160/160]
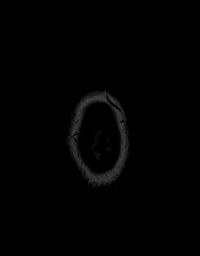

[Series 13: DWI · coronal · 5.0mm · 1.31mm/px · 3 of 72 slices shown (3 of 4)]
[im 1/72]
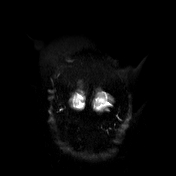
[im 36/72]
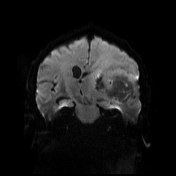
[im 72/72]
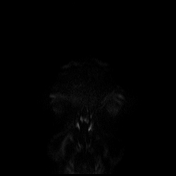

[Series 14: DWI · coronal · 5.0mm · 1.31mm/px · 2 of 36 slices shown (4 of 4)]
[im 1/36]
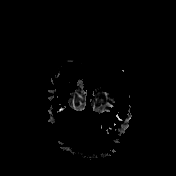
[im 36/36]
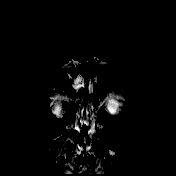

[Series 15: T2 post-contrast · coronal · 5.0mm · 0.57mm/px · 1 of 26 slices shown]
[im 1/26]
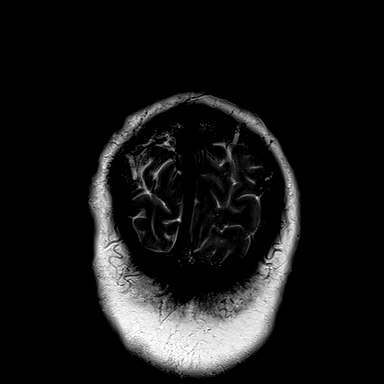

[Series 16: T1 post-contrast · axial · 1.0mm · 0.94mm/px · z∈[-52,+106]mm · 7 of 160 slices shown (1 of 3)]
[im 1/160]
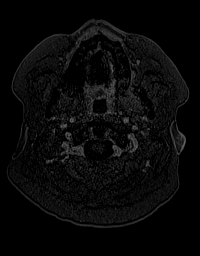
[im 27/160]
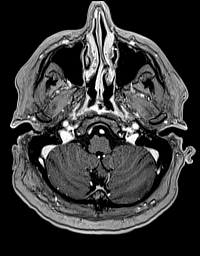
[im 54/160]
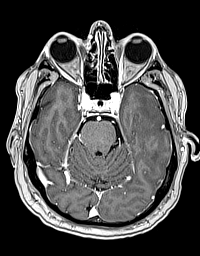
[im 80/160]
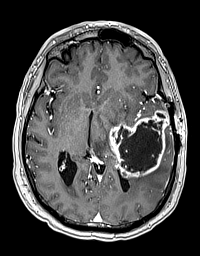
[im 107/160]
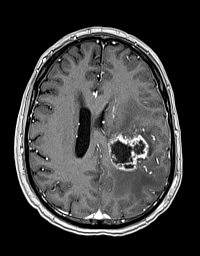
[im 133/160]
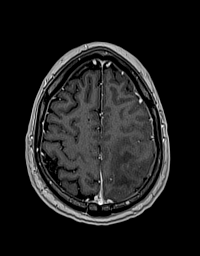
[im 160/160]
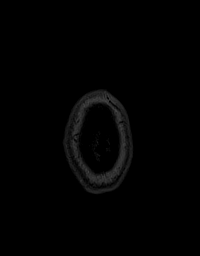

[Series 17: T1 · sagittal · 4.0mm · 0.94mm/px · 1 of 30 slices shown (3 of 4)]
[im 1/30]
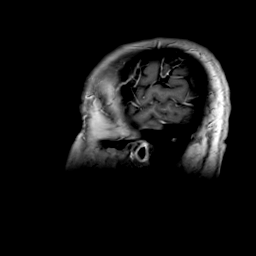

[Series 18: T1 · coronal · 4.0mm · 0.94mm/px · 1 of 30 slices shown (4 of 4)]
[im 1/30]
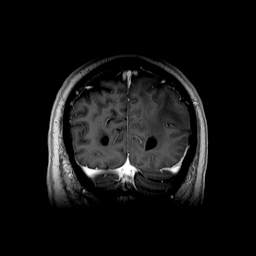

[Series 19: T1 post-contrast · coronal · 5.0mm · 0.47mm/px · 1 of 26 slices shown (2 of 3)]
[im 1/26]
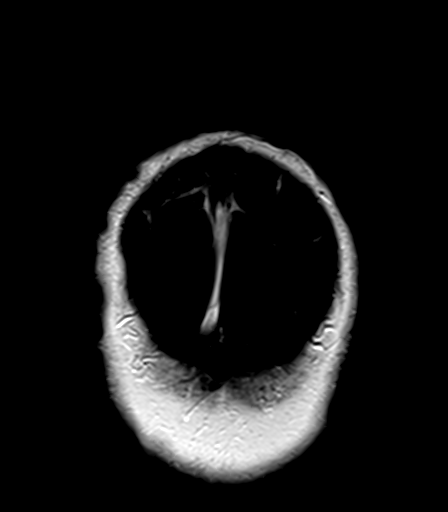

[Series 20: T1 post-contrast · sagittal · 5.0mm · 0.75mm/px · 1 of 24 slices shown (3 of 3)]
[im 1/24]
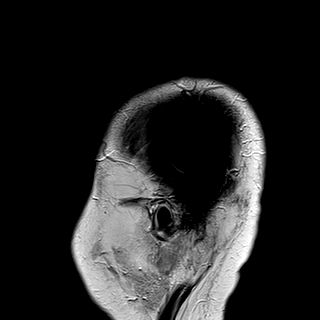

[41 of 48 positions shown; findings below may reference images not displayed]

FINDINGS: Brain: Increase in size of necrotic lesion with irregular
enhancement at the periphery centered within the temporal lobe
extending superomedially to the margin of the atrium and posterior
body of the left lateral ventricle. Current representative
measurements of 7 x 5.6 x 5.4 cm (remeasured on prior as 7 x 5.2 x 5
cm). No new enhancement remote from this lesion.

Significant increase in surrounding left cerebral T2 FLAIR
hyperintensity extending into the thalamus and optic chiasm.
Rightward midline shift now measures up to 8 mm at the level of the
septum pellucidum (previously 3 mm). There is mild trapping of the
right lateral ventricle.

There is no acute infarction. There are chronic blood products
associated with the lesion. Small area of subacute blood products
are also noted.

Vascular: Major vessel flow voids at the skull base are preserved.

Skull and upper cervical spine: Normal marrow signal is preserved.

Sinuses/Orbits: Minor mucosal thickening.  Orbits are unremarkable.

Other: Sella is unremarkable.  Mastoid air cells are clear.
IMPRESSION: Increase in size of necrotic left cerebral lesion. Significant
increase in surrounding T2 FLAIR hyperintensity. Resulting mass
effect including 8 mm rightward midline shift and mild trapping of
the right lateral ventricle. Disease progression suspected.

These results will be called to the ordering clinician or
representative by the Radiologist Assistant, and communication
documented in the PACS or [REDACTED].

## 2020-10-04 MED ORDER — GADOBUTROL 1 MMOL/ML IV SOLN
8.0000 mL | Freq: Once | INTRAVENOUS | Status: AC | PRN
  Administered 2020-10-04: 8 mL via INTRAVENOUS

## 2020-10-04 NOTE — Progress Notes (Signed)
DISCONTINUE ON PATHWAY REGIMEN - Neuro     A cycle is every 28 days:     Temozolomide      Temozolomide   **Always confirm dose/schedule in your pharmacy ordering system**  REASON: Disease Progression PRIOR TREATMENT: [id number]: Temozolomide 150/200 mg/m2 PO D1-5 q28 Days x up to 12 Cycles TREATMENT RESPONSE: Progressive Disease (PD)  START ON PATHWAY REGIMEN - Neuro     A cycle is every 14 days:     Bevacizumab-xxxx   **Always confirm dose/schedule in your pharmacy ordering system**  Patient Characteristics: Glioblastoma (Grade IV Glioma), Recurrent or Progressive, Nonsurgical Candidate, Systemic Therapy Candidate Disease Classification: Glioma Disease Classification: Glioblastoma (Grade IV Glioma) Disease Status: Recurrent or Progressive Treatment Classification: Nonsurgical Candidate Treatment (Nonsurgical/Adjuvant): Systemic Therapy Candidate Intent of Therapy: Non-Curative / Palliative Intent, Discussed with Patient

## 2020-10-05 ENCOUNTER — Other Ambulatory Visit: Payer: Self-pay

## 2020-10-05 ENCOUNTER — Inpatient Hospital Stay: Payer: 59

## 2020-10-07 ENCOUNTER — Other Ambulatory Visit: Payer: 59

## 2020-10-17 ENCOUNTER — Ambulatory Visit: Payer: 59 | Attending: Internal Medicine | Admitting: Physical Therapy

## 2020-10-17 ENCOUNTER — Encounter: Payer: Self-pay | Admitting: Physical Therapy

## 2020-10-17 ENCOUNTER — Encounter: Payer: Self-pay | Admitting: Occupational Therapy

## 2020-10-17 ENCOUNTER — Ambulatory Visit: Payer: 59 | Admitting: Speech Pathology

## 2020-10-17 ENCOUNTER — Other Ambulatory Visit: Payer: Self-pay

## 2020-10-17 ENCOUNTER — Ambulatory Visit: Payer: 59 | Admitting: Occupational Therapy

## 2020-10-17 DIAGNOSIS — R4701 Aphasia: Secondary | ICD-10-CM

## 2020-10-17 DIAGNOSIS — R41844 Frontal lobe and executive function deficit: Secondary | ICD-10-CM | POA: Diagnosis present

## 2020-10-17 DIAGNOSIS — R278 Other lack of coordination: Secondary | ICD-10-CM | POA: Insufficient documentation

## 2020-10-17 DIAGNOSIS — R2689 Other abnormalities of gait and mobility: Secondary | ICD-10-CM | POA: Diagnosis present

## 2020-10-17 DIAGNOSIS — R41842 Visuospatial deficit: Secondary | ICD-10-CM | POA: Diagnosis present

## 2020-10-17 DIAGNOSIS — M6281 Muscle weakness (generalized): Secondary | ICD-10-CM | POA: Diagnosis present

## 2020-10-17 DIAGNOSIS — R29818 Other symptoms and signs involving the nervous system: Secondary | ICD-10-CM | POA: Diagnosis present

## 2020-10-17 DIAGNOSIS — R41841 Cognitive communication deficit: Secondary | ICD-10-CM | POA: Diagnosis present

## 2020-10-17 DIAGNOSIS — R2681 Unsteadiness on feet: Secondary | ICD-10-CM

## 2020-10-17 DIAGNOSIS — R41840 Attention and concentration deficit: Secondary | ICD-10-CM | POA: Diagnosis present

## 2020-10-17 NOTE — Therapy (Signed)
[facility] [address] [place], [place], [address] Phone: [phone]   Fax:  [phone]  Occupational Therapy Evaluation  Patient Details  Name: Christopher Tucker MRN: [id number] Date of Birth: 09/02/1961 Referring Provider (OT): Cecil Cobbs MD   Encounter Date: 10/17/2020   OT End of Session - 10/17/20 1518    Visit Number 1    Number of Visits 17    Date for OT Re-Evaluation 12/12/20    Authorization Type [employer]IL[employer]    Authorization Time Period ends Oct 22, 2020 - check w [employer] when new policy starts    OT Start Time 1317    OT Stop Time 1400    OT Time Calculation (min) 43 min    Activity Tolerance Patient tolerated treatment well    Behavior During Therapy [facility] for tasks assessed/performed;Impulsive           Past Medical History:  Diagnosis Date  . High cholesterol   . History of kidney stones     Past Surgical History:  Procedure Laterality Date  . APPLICATION OF CRANIAL NAVIGATION Left 04/25/2020   Procedure: APPLICATION OF CRANIAL NAVIGATION;  Surgeon: Consuella Lose, MD;  Location: [place];  Service: Neurosurgery;  Laterality: Left;  . CRANIOTOMY Left 04/25/2020   Procedure: STEREOTACTIC LEFT TEMPORAL CRANIOTOMY FOR TUMOR;  Surgeon: Consuella Lose, MD;  Location: [place];  Service: Neurosurgery;  Laterality: Left;  . HERNIA REPAIR Left    groin     There were no vitals filed for this visit.   Subjective Assessment - 10/17/20 1428    Subjective  Pt denies any pain. Pt reports brain cancer starting in Spring of 2021 and getting worse this Fall. Pt reports weakness is not unilateral anymore but has become more difficult with left as well as the right. Pt and spouse report that when steroid is tapered off is when patient gets significantly worse and needing more assistance.    Patient is accompanied by: Family member   spouse   Limitations Fall Risk. Not Driving. Active Cancer    Patient Stated Goals  "fix myself"    Currently in Pain? No/denies             [facility] OT Assessment - 10/17/20 1314      Assessment   Medical Diagnosis Glioblastoma Grade IV    Referring Provider (OT) Cecil Cobbs MD    Hand Dominance Right    Prior Therapy no previous therapy      Precautions   Precautions Other (comment);Fall    Precaution Comments Active Cancer      Balance Screen   Has the patient fallen in the past 6 months No   defer to [place] expects to be discharged to: Private residence    Living Arrangements Spouse/significant other    Type of [place]   [place] to live on main level with bedroom/bathroom    Bathroom Building control surveyor   door     Prior Function   Level of Independence Independent    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    Leisure spend time with wife, running, hiking, camping, handout with kids      IADL   Shopping Needs to be accompanied on any shopping trip;Shops independently for small purchases    Light Housekeeping Performs light daily tasks such as dishwashing, bed  making    Prior Level of Function Meal Prep spouse does most of the cooking prior    Meal Prep Needs to have meals prepared and served    Prior Level of Function Community Mobility has not driven in 6 months - was driving prior    [employer] on family or friends for transportation    Medication Management Takes responsibility if medication is prepared in advance in seperate dosage    Prior Level of Function Financial Management independent prior    Financial Management Requires supervision/minimal cuing      Written Expression   Dominant Hand Right    Written Experience --   reports it has gotten much worse     Vision - History   Baseline Vision Wears contact    Additional Comments pt reports vision is getting worse but cannot verbalize deficits      Vision  Assessment   Eye Alignment Within Functional Limits    Saccades Within functional limits    Convergence Impaired - to be further tested in functional context    Visual Fields No apparent deficits    Financial risk analyst Test-Vertical 85%   most difficulty with parts/whole section, impulsive with answerings questions     Cognition   Overall Cognitive Status Impaired/Different from baseline    Memory Impaired    Behaviors Impulsive    Cognition Comments transcortical expressive dysphagia      Coordination   Gross Motor Movements are Fluid and Coordinated No    Fine Motor Movements are Fluid and Coordinated No    9 Hole Peg Test Right;Left    Right 9 Hole Peg Test 29.47   tremors and poor coordination noted   Left 9 Hole Peg Test 25.57   tremors and poor coordination noted   Tremors BUE L>R      Perception   Perception Impaired    Inattention/Neglect Impaired - to be further tested in functual context   spouse reports patient is brushing up against doors more. continue to assess     Hand Function   Right Hand Gross Grasp Functional    Right Hand Grip (lbs) 72.9    Left Hand Gross Grasp Functional    Left Hand Grip (lbs) 76                             OT Short Term Goals - 10/17/20 1454      OT SHORT TERM GOAL #1   Title Pt will be independent with HEP 11/14/2020    Time 4    Period Weeks    Status New    Target Date 11/14/20      OT SHORT TERM GOAL #2   Title Pt will perform environmental scanning in moderately distracted environment with 90% accuracy    Time 4    Period Weeks    Status New      OT SHORT TERM GOAL #3   Title Pt will complete basic ADLs with mod I .    Time 4    Period Weeks    Status New      OT SHORT TERM GOAL #4   Title Pt will verbalize understanding of energy conservation strategies    Time 4    Period Weeks    Status New      OT SHORT TERM GOAL #5   Title Pt will sustain attention to a cognitive task for 15  minutes with no cues for redirection and completing with 90% accuracy or greater.    Time 4    Period Weeks    Status New             OT Long Term Goals - 10/17/20 1507      OT LONG TERM GOAL #1   Title Pt will be independent with updated HEP 12/12/2020    Time 8    Period Weeks    Status New    Target Date 12/12/20      OT LONG TERM GOAL #2   Title Pt will tolerate standing activities for 15 consecutive minutes for increasing standing tolerance and endurance for increasing independence with IADLs.    Time 8    Period Weeks    Status New      OT LONG TERM GOAL #3   Title Pt will read a short paragraph and answer questions with 100% accuracy    Time 8    Period Weeks    Status New      OT LONG TERM GOAL #4   Title Pt will perform physical and cognitive tasks simultaneously with 90% accuracy    Time 8    Period Weeks    Status New      OT LONG TERM GOAL #5   Title Pt will perform 9 hole peg test with BUE with maintaining current fine motor coordination and/or improvement    Baseline RUE 29.47, LUE 25.57    Time 8    Period Weeks    Status New                 Plan - 10/17/20 1438    Clinical Impression Statement Pt is a 60 year old male that presents to Neuro OPOT with Grade IV Glioblastoma. Pt has past medical history of HLD and sciatica. Pt started having signs and symptoms of current condition in Spring of 2021 with worsening in the Fall of 2021. Pt now presents with deficits with BUE tremors with left greater than right, unsteadiness on feet, aphasia and overall deficits with cognition with an overall decline in independence of ADLs and IADLs. Skilled occupational therapy is recommended to target the areas of defict and increase independence and decrease caregiver burden.    OT Occupational Profile and History Problem Focused Assessment - Including review of records relating to presenting problem    Occupational performance deficits (Please refer to evaluation  for details): IADL's;ADL's;Leisure;Work    Marketing executive / Function / Physical Skills ADL;Decreased knowledge of use of DME;Strength;GMC;Dexterity;Proprioception;UE functional use;ROM;IADL;Vision;Coordination;Mobility;FMC    Cognitive Skills Attention;Problem Solve;Safety Awareness;Understand    Rehab Potential Good    Clinical Decision Making Limited treatment options, no task modification necessary    Comorbidities Affecting Occupational Performance: None    Modification or Assistance to Complete Evaluation  No modification of tasks or assist necessary to complete eval    OT Frequency 2x / week    OT Duration 8 weeks   or 16 visits over extended period d/t scheduling   OT Treatment/Interventions Self-care/ADL training;Electrical Stimulation;Ultrasound;DME and/or AE instruction;Balance training;Visual/perceptual remediation/compensation;Patient/family education;Energy conservation;Functional Mobility Training;Neuromuscular education;Cognitive remediation/compensation;Therapeutic exercise;Therapeutic activities    Plan energy conservation strategies, environmental scanning    Consulted and Agree with Plan of Care Patient;Family member/caregiver    Family Member Consulted spouse           Patient will benefit from skilled therapeutic intervention in order to improve the following deficits and impairments:   Body Structure /  Function / Physical Skills: ADL,Decreased knowledge of use of DME,Strength,GMC,Dexterity,Proprioception,UE functional use,ROM,IADL,Vision,Coordination,Mobility,FMC Cognitive Skills: Attention,Problem Solve,Safety Awareness,Understand     Visit Diagnosis: Attention and concentration deficit - Plan: Ot plan of care cert/re-cert  Other lack of coordination - Plan: Ot plan of care cert/re-cert  Unsteadiness on feet - Plan: Ot plan of care cert/re-cert  Visuospatial deficit - Plan: Ot plan of care cert/re-cert  Muscle weakness (generalized) - Plan: Ot plan of care  cert/re-cert  Frontal lobe and executive function deficit - Plan: Ot plan of care cert/re-cert  Other abnormalities of gait and mobility - Plan: Ot plan of care cert/re-cert    Problem List Patient Active Problem List   Diagnosis Date Noted  . Glioblastoma, IDH-wildtype ([place]) 05/14/2020  . Brain tumor ([place]) 04/25/2020  . HYPERLIPIDEMIA 07/31/2008  . SCIATICA, ACUTE 07/31/2008    Zachery Conch MOT, OTR/L  10/17/2020, 3:20 PM  [facility] [address] [place], [place], [address] Phone: [phone]   Fax:  [phone]  Name: Aniruddh Ciavarella MRN: [id number] Date of Birth: November 17, 1960

## 2020-10-17 NOTE — Patient Instructions (Signed)
   Tips for Talking with People who have Aphasia  . Say one thing at a time . Don't  rush - slow down, be patient . Talk face to face . Reduce background noise . Relax - be natural . Use pen and paper . Write down key words . Draw diagrams or pictures . Don't pretend you understand . Ask what helps . Recap - check you both understand . Be a partner, not a therapist   Aphasia does not affect intelligence, only language. The person with aphasia can still: make decisions, have opinions, and socialize.   Describing words  What group does it belong to?  What do I use it for?  Where can I find it?  What does it LOOK like?  What other words go with it?  What is the 1st sound of the word?   Many Ways to Communicate  Describe it Write it Draw it Gesture it Use related words   To help him understand speech:  Get the persons attention before you speak  Use eye contact and face the person you are speaking to  Be in close proximity to the person you are speaking to  Turn down any noise in the environment such as the TV, walk away from loud appliances, air conditioners, fans, dish washers etc    Resources  [facility] [place] with Dr. Leo Rod at [facility] - email jaoberme@[employer] .edu  TalkPath Therapy app by [place] on [employer] website National Aphasia Association - naa.aphasia.org Aphasia Recovery Connection - aphasiarecoveryconnection.org Tactus therapy apps Constant Therapy  Watch: Patience Listening and Communicating with Aphasia Patients on YouTube MarathonDancing.gl   Provided by: [employer], [id number]

## 2020-10-18 ENCOUNTER — Inpatient Hospital Stay (HOSPITAL_BASED_OUTPATIENT_CLINIC_OR_DEPARTMENT_OTHER): Payer: 59 | Admitting: Internal Medicine

## 2020-10-18 ENCOUNTER — Inpatient Hospital Stay: Payer: 59

## 2020-10-18 ENCOUNTER — Other Ambulatory Visit: Payer: Self-pay

## 2020-10-18 VITALS — BP 141/99 | HR 68 | Temp 98.1°F | Resp 18 | Ht 71.0 in | Wt 197.1 lb

## 2020-10-18 VITALS — BP 158/100

## 2020-10-18 DIAGNOSIS — C719 Malignant neoplasm of brain, unspecified: Secondary | ICD-10-CM

## 2020-10-18 DIAGNOSIS — Z5112 Encounter for antineoplastic immunotherapy: Secondary | ICD-10-CM | POA: Diagnosis not present

## 2020-10-18 LAB — COMPREHENSIVE METABOLIC PANEL
ALT: 60 U/L — ABNORMAL HIGH (ref 0–44)
AST: 26 U/L (ref 15–41)
Albumin: 3.4 g/dL — ABNORMAL LOW (ref 3.5–5.0)
Alkaline Phosphatase: 50 U/L (ref 38–126)
Anion gap: 9 (ref 5–15)
BUN: 26 mg/dL — ABNORMAL HIGH (ref 6–20)
CO2: 27 mmol/L (ref 22–32)
Calcium: 8.8 mg/dL — ABNORMAL LOW (ref 8.9–10.3)
Chloride: 103 mmol/L (ref 98–111)
Creatinine, Ser: 0.84 mg/dL (ref 0.61–1.24)
GFR, Estimated: >60 mL/min (ref >60)
Glucose, Bld: 111 mg/dL — ABNORMAL HIGH (ref 70–99)
Potassium: 4.4 mmol/L (ref 3.5–5.1)
Sodium: 139 mmol/L (ref 135–145)
Total Bilirubin: 1 mg/dL (ref 0.3–1.2)
Total Protein: 6.6 g/dL (ref 6.5–8.1)

## 2020-10-18 LAB — CBC WITH DIFFERENTIAL/PLATELET
Abs Immature Granulocytes: 0.5 10*3/uL — ABNORMAL HIGH (ref 0.00–0.07)
Basophils Absolute: 0 10*3/uL (ref 0.0–0.1)
Basophils Relative: 0 %
Eosinophils Absolute: 0 10*3/uL (ref 0.0–0.5)
Eosinophils Relative: 0 %
HCT: 40.8 % (ref 39.0–52.0)
Hemoglobin: 13.5 g/dL (ref 13.0–17.0)
Immature Granulocytes: 5 %
Lymphocytes Relative: 3 %
Lymphs Abs: 0.3 10*3/uL — ABNORMAL LOW (ref 0.7–4.0)
MCH: 31.8 pg (ref 26.0–34.0)
MCHC: 33.1 g/dL (ref 30.0–36.0)
MCV: 96 fL (ref 80.0–100.0)
Monocytes Absolute: 0.5 10*3/uL (ref 0.1–1.0)
Monocytes Relative: 5 %
Neutro Abs: 8.9 10*3/uL — ABNORMAL HIGH (ref 1.7–7.7)
Neutrophils Relative %: 87 %
Platelets: 110 10*3/uL — ABNORMAL LOW (ref 150–400)
RBC: 4.25 MIL/uL (ref 4.22–5.81)
RDW: 15.1 % (ref 11.5–15.5)
WBC: 10.3 10*3/uL (ref 4.0–10.5)
nRBC: 0.2 % (ref 0.0–0.2)

## 2020-10-18 LAB — TOTAL PROTEIN, URINE DIPSTICK: Protein, ur: NEGATIVE mg/dL

## 2020-10-18 MED ORDER — SODIUM CHLORIDE 0.9 % IV SOLN
Freq: Once | INTRAVENOUS | Status: AC
  Filled 2020-10-18: qty 250

## 2020-10-18 MED ORDER — SODIUM CHLORIDE 0.9 % IV SOLN
10.0000 mg/kg | Freq: Once | INTRAVENOUS | Status: AC
  Administered 2020-10-18: 900 mg via INTRAVENOUS
  Filled 2020-10-18: qty 32

## 2020-10-18 NOTE — Progress Notes (Signed)
Per Dr. [name], ok to proceed with elevated BP.

## 2020-10-18 NOTE — Therapy (Signed)
[facility] [address] [place], [place], [address] Phone: [phone]   Fax:  [phone]  Physical Therapy Evaluation  Patient Details  Name: Christopher Tucker MRN: [id number] Date of Birth: 02/25/1961 Referring Provider (PT): [name], MD   Encounter Date: 10/17/2020   PT End of Session - 10/17/20 1447    Visit Number 1    Number of Visits 17    Authorization Type [name] (ends jan 31st 2022) - will have to check with gray for insurance info in Feb    PT Start Time 1402    PT Stop Time 1445    PT Time Calculation (min) 43 min    Equipment Utilized During Treatment Gait belt    Activity Tolerance Patient tolerated treatment well    Behavior During Therapy [facility] for tasks assessed/performed           Past Medical History:  Diagnosis Date  . High cholesterol   . History of kidney stones     Past Surgical History:  Procedure Laterality Date  . APPLICATION OF CRANIAL NAVIGATION Left 04/25/2020   Procedure: APPLICATION OF CRANIAL NAVIGATION;  Surgeon: Consuella Lose, MD;  Location: [place];  Service: Neurosurgery;  Laterality: Left;  . CRANIOTOMY Left 04/25/2020   Procedure: STEREOTACTIC LEFT TEMPORAL CRANIOTOMY FOR TUMOR;  Surgeon: Consuella Lose, MD;  Location: [place];  Service: Neurosurgery;  Laterality: Left;  . HERNIA REPAIR Left    groin     There were no vitals filed for this visit.    Subjective Assessment - 10/17/20 1404    Subjective Pt started having signs and symptoms of brain tumor in Spring of 2021 with worsening in the Fall of 2021.  Since his last visit with Dr. [name] (on 10/01/20) he has also been having an incr in weakness on the L side of his body. Dr. [name] incr his steroids at the last session due to a progression in the tumor. Wife reports his balance and walking is better on a higher dose of steroids. No falls. No dizziness. Walking and balance feels like it is getting worse.  Still able to walk around the neighborhood. Wife reports sometimes R leg will drag. Notices a gradual decline. Will be getting a new infusion tomorrow with Dr. [name]. Difficulty going up and down stairs.    Pertinent History left temporal anaplastic astrocytoma (s/p Stereotactic left temporal craniotomy for biopsy of L temporal lobe tumor on 04/25/20)    Limitations Walking;House hold activities    Patient Stated Goals improve balance and walking    Currently in Pain? No/denies              [facility] PT Assessment - 10/17/20 1412      Assessment   Medical Diagnosis Glioblastoma Grade IV    Referring Provider (PT) [name], MD    Onset Date/Surgical Date 10/01/20   date of referral   Hand Dominance Right    Prior Therapy no previous therapy      Precautions   Precautions Other (comment);Fall    Precaution Comments active cancer      Balance Screen   Has the patient fallen in the past 6 months No    Has the patient had a decrease in activity level because of a fear of falling?  No   moves a little slower   Is the patient reluctant to leave their home because of a fear of falling?  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    Leisure spend time with wife, running, hiking, camping, handout with kids      Sensation   Light Touch Appears Intact      Coordination   Gross Motor Movements are Fluid and Coordinated Yes      ROM / Strength   AROM / PROM / Strength  Strength      Strength   Strength Assessment Site Hip;Knee;Ankle    Right/Left Hip Right;Left    Right Hip Flexion 4/5    Left Hip Flexion 5/5    Right/Left Knee Right;Left    Right Knee Flexion 4+/5    Right Knee Extension 4+/5    Left Knee Flexion 5/5    Left Knee Extension 5/5    Right/Left Ankle Right;Left    Right Ankle Dorsiflexion 4/5    Left Ankle Dorsiflexion 5/5      Transfers   Transfers Sit to Stand;Stand to Sit    Sit to Stand 5: Supervision;Without upper extremity assist    Five time sit to stand comments  11.91    Stand to Sit 5: Supervision;Without upper extremity assist      Ambulation/Gait   Ambulation/Gait Yes    Ambulation/Gait Assistance 5: Supervision    Ambulation Distance (Feet) --   clinic distances during eval   Assistive device None    Gait Pattern Step-through pattern;Decreased dorsiflexion - left;Poor foot clearance - right   with incr gait distances noted incr R foot scuffing   Ambulation Surface Level;Indoor    Gait velocity 8 seconds = 4.1 ft/sec    Stairs Yes    Stairs Assistance 5: Supervision    Stair Management Technique No rails;Alternating pattern;Forwards    Number of Stairs 4    Height of Stairs 6      High Level Balance   High Level Balance Comments mCTSIB: conditions 1-4: 30 seconds, incr postural sway with condition 4      Functional Gait  Assessment   Gait assessed  Yes    Gait Level Surface Walks 20 ft in less than 5.5 sec, no assistive devices, good speed, no evidence for imbalance, normal gait pattern, deviates no more than 6 in outside of the 12 in walkway width.   5.05 seconds   Change in Gait Speed Able to smoothly change walking speed without loss of balance or gait deviation. Deviate no more than 6 in outside of the 12 in walkway width.    Gait with Horizontal Head Turns Performs head turns smoothly with slight change in gait velocity (eg, minor disruption to smooth gait path), deviates 6-10 in outside 12 in walkway width,  or uses an assistive device.    Gait with Vertical Head Turns Performs task with slight change in gait velocity (eg, minor disruption to smooth gait path), deviates 6 - 10 in outside 12 in walkway width or uses assistive  device    Gait and Pivot Turn Pivot turns safely within 3 sec and stops quickly with no loss of balance.    Step Over Obstacle Is able to step over 2 stacked shoe boxes taped together (9 in total height) without changing gait speed. No evidence of imbalance.    Gait with Narrow Base of Support Ambulates less than 4 steps heel to toe or cannot perform without assistance.    Gait with Eyes Closed Cannot walk 20 ft without assistance, severe gait deviations or imbalance, deviates greater than 15 in outside 12 in walkway width or will not attempt task.    Ambulating Backwards Walks 20 ft, uses assistive device, slower speed, mild gait deviations, deviates 6-10 in outside 12 in walkway width.    Steps Alternating feet, no rail.    Total Score 21    FGA comment: 21/30                      Objective measurements completed on examination: See above findings.               PT Education - 10/17/20 1447    Education Details clinical findings, POC    Person(s) Educated Patient;Spouse    Methods Explanation    Comprehension Verbalized understanding            PT Short Term Goals - 10/18/20 0901      PT SHORT TERM GOAL #1   Title Pt will be independent with initial HEP in order to build upon functional gains made in therapy. ALL STGS DUE 11/15/20    Time 4    Period Weeks    Status New    Target Date 11/15/20      PT SHORT TERM GOAL #2   Title Pt will trial R foot up brace for improved foot clearance with gait when more fatigued.    Time 4    Period Weeks    Status New      PT SHORT TERM GOAL #3   Title Pt will improve FGA score to at least 23/30 in order to demo decr fall risk.    Baseline 21/30    Time 4    Period Weeks    Status New      PT  SHORT TERM GOAL #4   Title Pt will ambulate at least 200' over outdoor paved surfaces with supervision with no AD vs. LRAD in order to demo improved community mobility.    Time 4    Period Weeks    Status New      PT SHORT TERM GOAL #5   Title Pt and pt's wife will verbalize understanding of fall prevention in the home.    Time 4    Period Weeks    Status New             PT Long Term Goals - 10/18/20 0904      PT LONG TERM GOAL #1   Title Pt will be independent with final HEP in order to build upon functional gains made in therapy. ALL LTGS DUE 12/13/20    Time 8    Period Weeks    Status New    Target Date 12/13/20      PT LONG TERM GOAL #2   Title Pt will improve FGA score to at least 25/30 in order to demo decr fall risk.    Baseline 21/30    Time 8    Period  Weeks    Status New      PT LONG TERM GOAL #3   Title Pt will perform at least 3 steps with LRAD vs. step to pattern with no handrail with supevision in order to safely enter/exit house.    Time 8    Period Weeks    Status New      PT LONG TERM GOAL #4   Title Pt will ambulate at least 500' over outdoor paved surfaces with supervision with no AD vs. LRAD in order to demo improved community mobility    Time 8    Period Weeks    Status New      PT LONG TERM GOAL #5   Title Pt will maintain 5x sit <> stand time of 11.91 seconds or less in order to demo improved functional BLE strength and decr fall risk.    Baseline 11.91 seconds    Time 8    Period Weeks    Status New                  Plan - 10/18/20 0907    Clinical Impression Statement Pt is a 60 year old male that presents to Neuro OPPT with Grade IV Glioblastoma. Pt has past medical history of HLD and sciatica. Pt started having signs and symptoms of current condition in Spring of 2021 with worsening in the Fall of 2021.  The following deficits were present during the exam:  BLE weakness (RLE>LLE), impaired balance, gait abnormalities. Pt  reporting that today is a good day based on his gait/balance/mobility due to being on a higher dose of steroids. Based on FGA pt is at a moderate risk for falls. Pt would benefit from skilled PT to address these impairments and functional limitations to maximize functional mobility independence    Personal Factors and Comorbidities Comorbidity 1    Comorbidities Glioblastoma Grade IV    Examination-Activity Limitations Stand;Transfers;Stairs;Locomotion Level    Examination-Participation Restrictions Community Activity;Occupation;Driving   works as an Sports administrator Evolving/Moderate complexity    Clinical Decision Making Moderate    Rehab Potential Good    PT Frequency 2x / week    PT Duration 8 weeks    PT Treatment/Interventions ADLs/Self Care Home Management;DME Instruction;Gait training;Stair training;Therapeutic activities;Functional mobility training;Therapeutic exercise;Balance training;Neuromuscular re-education;Patient/family education;Vestibular;Energy conservation;Orthotic Fit/Training    PT Next Visit Plan initial HEP for functional strength/balance. trial R foot up brace. initiate gait training with cane.    Consulted and Agree with Plan of Care Patient           Patient will benefit from skilled therapeutic intervention in order to improve the following deficits and impairments:  Abnormal gait,Decreased balance,Decreased activity tolerance,Difficulty walking,Decreased strength  Visit Diagnosis: Unsteadiness on feet  Other abnormalities of gait and mobility  Muscle weakness (generalized)  Other symptoms and signs involving the nervous system     Problem List Patient Active Problem List   Diagnosis Date Noted  . Glioblastoma, IDH-wildtype ([place]) 05/14/2020  . Brain tumor ([place]) 04/25/2020  . HYPERLIPIDEMIA 07/31/2008  . SCIATICA, ACUTE 07/31/2008    [name], PT, DPT  10/18/2020, 9:19 AM  [facility] [address] [place], [place], [address] Phone: [phone]   Fax:  [phone]  Name: Christopher Tucker MRN: [id number] Date of Birth: 1961-06-07

## 2020-10-18 NOTE — Patient Instructions (Signed)
[facility] Discharge Instructions for Patients Receiving Chemotherapy  Today you received the following chemotherapy agents: bevacizumab.  To help prevent nausea and vomiting after your treatment, we encourage you to take your nausea medication as directed.   If you develop nausea and vomiting that is not controlled by your nausea medication, call the clinic.   BELOW ARE SYMPTOMS THAT SHOULD BE REPORTED IMMEDIATELY:  *FEVER GREATER THAN 100.5 F  *CHILLS WITH OR WITHOUT FEVER  NAUSEA AND VOMITING THAT IS NOT CONTROLLED WITH YOUR NAUSEA MEDICATION  *UNUSUAL SHORTNESS OF BREATH  *UNUSUAL BRUISING OR BLEEDING  TENDERNESS IN MOUTH AND THROAT WITH OR WITHOUT PRESENCE OF ULCERS  *URINARY PROBLEMS  *BOWEL PROBLEMS  UNUSUAL RASH Items with * indicate a potential emergency and should be followed up as soon as possible.  Feel free to call the clinic should you have any questions or concerns. The clinic phone number is [phone].  Please show the CHEMO ALERT CARD at check-in to the Emergency Department and triage nurse.  Bevacizumab injection What is this medicine? BEVACIZUMAB (be va SIZ yoo mab) is a monoclonal antibody. It is used to treat many types of cancer. This medicine may be used for other purposes; ask your health care provider or pharmacist if you have questions. COMMON BRAND NAME(S): Avastin, MVASI, Zirabev What should I tell my health care provider before I take this medicine? They need to know if you have any of these conditions:  diabetes  heart disease  high blood pressure  history of coughing up blood  prior anthracycline chemotherapy (e.g., doxorubicin, daunorubicin, epirubicin)  recent or ongoing radiation therapy  recent or planning to have surgery  stroke  an unusual or allergic reaction to bevacizumab, hamster proteins, mouse proteins, other medicines, foods, dyes, or preservatives  pregnant or trying to get  pregnant  breast-feeding How should I use this medicine? This medicine is for infusion into a vein. It is given by a health care professional in a hospital or clinic setting. Talk to your pediatrician regarding the use of this medicine in children. Special care may be needed. Overdosage: If you think you have taken too much of this medicine contact a poison control center or emergency room at once. NOTE: This medicine is only for you. Do not share this medicine with others. What if I miss a dose? It is important not to miss your dose. Call your doctor or health care professional if you are unable to keep an appointment. What may interact with this medicine? Interactions are not expected. This list may not describe all possible interactions. Give your health care provider a list of all the medicines, herbs, non-prescription drugs, or dietary supplements you use. Also tell them if you smoke, drink alcohol, or use illegal drugs. Some items may interact with your medicine. What should I watch for while using this medicine? Your condition will be monitored carefully while you are receiving this medicine. You will need important blood work and urine testing done while you are taking this medicine. This medicine may increase your risk to bruise or bleed. Call your doctor or health care professional if you notice any unusual bleeding. Before having surgery, talk to your health care provider to make sure it is ok. This drug can increase the risk of poor healing of your surgical site or wound. You will need to stop this drug for 28 days before surgery. After surgery, wait at least 28 days before restarting this drug. Make sure the surgical   site or wound is healed enough before restarting this drug. Talk to your health care provider if questions. Do not become pregnant while taking this medicine or for 6 months after stopping it. Women should inform their doctor if they wish to become pregnant or think they  might be pregnant. There is a potential for serious side effects to an unborn child. Talk to your health care professional or pharmacist for more information. Do not breast-feed an infant while taking this medicine and for 6 months after the last dose. This medicine has caused ovarian failure in some women. This medicine may interfere with the ability to have a child. You should talk to your doctor or health care professional if you are concerned about your fertility. What side effects may I notice from receiving this medicine? Side effects that you should report to your doctor or health care professional as soon as possible:  allergic reactions like skin rash, itching or hives, swelling of the face, lips, or tongue  chest pain or chest tightness  chills  coughing up blood  high fever  seizures  severe constipation  signs and symptoms of bleeding such as bloody or black, tarry stools; red or dark-brown urine; spitting up blood or brown material that looks like coffee grounds; red spots on the skin; unusual bruising or bleeding from the eye, gums, or nose  signs and symptoms of a blood clot such as breathing problems; chest pain; severe, sudden headache; pain, swelling, warmth in the leg  signs and symptoms of a stroke like changes in vision; confusion; trouble speaking or understanding; severe headaches; sudden numbness or weakness of the face, arm or leg; trouble walking; dizziness; loss of balance or coordination  stomach pain  sweating  swelling of legs or ankles  vomiting  weight gain Side effects that usually do not require medical attention (report to your doctor or health care professional if they continue or are bothersome):  back pain  changes in taste  decreased appetite  dry skin  nausea  tiredness This list may not describe all possible side effects. Call your doctor for medical advice about side effects. You may report side effects to FDA at  1-800-FDA-1088. Where should I keep my medicine? This drug is given in a hospital or clinic and will not be stored at home. NOTE: This sheet is a summary. It may not cover all possible information. If you have questions about this medicine, talk to your doctor, pharmacist, or health care provider.  2021 Elsevier/Gold Standard (2019-07-06 10:50:46)    

## 2020-10-18 NOTE — Progress Notes (Signed)
[facility], [place] [address] [phone]   Interval Evaluation  Date of Service: 10/18/20 Patient Name: Christopher Tucker Patient MRN: [id number] Patient DOB: 06/22/1961 Provider: [name], MD  Identifying Statement:  Christopher Tucker is a 60 y.o. male with left temporal anaplastic astrocytoma   Oncologic History: Oncology History  Glioblastoma, IDH-wildtype ([place])  05/14/2020 Initial Diagnosis   Anaplastic astrocytoma, IDH-wildtype ([place])   05/21/2020 - 07/02/2020 Radiation Therapy   IMRT and concurrent Temodar with Dr. Lisbeth Renshaw   07/30/2020 -  Chemotherapy   The patient had dexamethasone (DECADRON) 4 MG tablet, 1 of 1 cycle, Start date: --, End date: -- temozolomide (TEMODAR) 5 MG capsule, 200 mg/m2/day = 410 mg, Oral, Daily, 1 of 1 cycle, Start date: --, End date: -- temozolomide (TEMODAR) 20 MG capsule, 200 mg/m2/day = 400 mg, Oral, Daily, 1 of 1 cycle, Start date: --, End date: -- temozolomide (TEMODAR) 100 MG capsule, 150 mg/m2/day = 300 mg, Oral, Daily, 1 of 1 cycle, Start date: 07/30/2020, End date: 08/04/2020 temozolomide (TEMODAR) 140 MG capsule, 200 mg/m2/day = 420 mg, Oral, Daily, 1 of 1 cycle, Start date: --, End date: --  for chemotherapy treatment.    10/18/2020 -  Chemotherapy    Patient is on Treatment Plan: BRAIN ANAPLASTIC GLIOMA GRADE III TEMOZOLOMIDE POST XRT Q28D   Patient is on Antibody Plan: BRAIN GBM BEVACIZUMAB 14D X 6 CYCLES       Biomarkers:  MGMT Unknown.  IDH 1/2 Wild type.  EGFR Unknown  TERT "Mutated   Interval History:  Christopher Tucker presents today for initial avastin infusion.  He denies any decline in fucntional status since prior visit, although his decadron dose is now up to 80m twice per day.  His right side is weak, and he requires assistance with ADLs such as dressing and bathing. He has decided to transition to disability and stay home from future work endeavors.  Does complain of  swelling in his face, neck and upper back. Denies seizures or headaches.     H+P (05/10/20) Patient presented to medical attention in early August with several months history of sensory episodes.  They are described as "numbness marching down right arm also involving the leg, lasting for several minutes".  There is no post event weakness or confusion.  No known history of seizures.  In recent weeks he has also experienced some difficulty with expressive language, finding particular words and maintaining high fluency.  Since starting Keppra post-operatively he has not had any recurrence of these sensory events.  He has weaned off dexamethasone.  We are still awaiting finalized pathology results.    Medications: Current Outpatient Medications on File Prior to Visit  Medication Sig Dispense Refill  . dexamethasone (DECADRON) 4 MG tablet TAKE 1 TABLET (4 MG TOTAL) BY MOUTH DAILY. 30 tablet 0  . ibuprofen (ADVIL) 200 MG tablet Take 400 mg by mouth 2 (two) times daily.    .Marland KitchenlevETIRAcetam (KEPPRA) 500 MG tablet Take 1 tablet (500 mg total) by mouth 2 (two) times daily. 60 tablet 1  . ondansetron (ZOFRAN) 8 MG tablet Take 1 tablet (8 mg total) by mouth 2 (two) times daily as needed (nausea and vomiting). May take 30-60 minutes prior to Temodar administration if nausea/vomiting occurs. 30 tablet 1  . sertraline (ZOLOFT) 25 MG tablet Take 1 tablet (25 mg total) by mouth daily. 30 tablet 3  . simvastatin (ZOCOR) 40 MG tablet Take 40  mg by mouth at bedtime.     No current facility-administered medications on file prior to visit.    Allergies: No Known Allergies Past Medical History:  Past Medical History:  Diagnosis Date  . High cholesterol   . History of kidney stones    Past Surgical History:  Past Surgical History:  Procedure Laterality Date  . APPLICATION OF CRANIAL NAVIGATION Left 04/25/2020   Procedure: APPLICATION OF CRANIAL NAVIGATION;  Surgeon: Consuella Lose, MD;  Location: [facility];   Service: Neurosurgery;  Laterality: Left;  . CRANIOTOMY Left 04/25/2020   Procedure: STEREOTACTIC LEFT TEMPORAL CRANIOTOMY FOR TUMOR;  Surgeon: Consuella Lose, MD;  Location: [facility];  Service: Neurosurgery;  Laterality: Left;  . HERNIA REPAIR Left    groin    Social History:  Social History   Socioeconomic History  . Marital status: Married    Spouse name: Not on file  . Number of children: Not on file  . Years of education: Not on file  . Highest education level: Not on file  Occupational History  . Not on file  Tobacco Use  . Smoking status: Never Smoker  . Smokeless tobacco: Never Used  Substance and Sexual Activity  . Alcohol use: Yes    Alcohol/week: 6.0 standard drinks    Types: 3 Glasses of wine, 3 Cans of beer per week  . Drug use: Not Currently  . Sexual activity: Not on file  Other Topics Concern  . Not on file  Social History Narrative  . Not on file   Social Determinants of Health   Financial Resource Strain: Not on file  Food Insecurity: Not on file  Transportation Needs: Not on file  Physical Activity: Not on file  Stress: Not on file  Social Connections: Not on file  Intimate Partner Violence: Not on file   Family History: No family history on file.  Review of Systems: Constitutional: Doesn't report fevers, chills or abnormal weight loss Eyes: Doesn't report blurriness of vision Ears, nose, mouth, throat, and face: Doesn't report sore throat Respiratory: Doesn't report cough, dyspnea or wheezes Cardiovascular: Doesn't report palpitation, chest discomfort  Gastrointestinal:  Doesn't report nausea, constipation, diarrhea GU: Doesn't report incontinence Skin: Doesn't report skin rashes Neurological: Per HPI Musculoskeletal: Doesn't report joint pain Behavioral/Psych: Doesn't report anxiety  Physical Exam: Vitals:   10/18/20 1242  BP: (!) 141/99  Pulse: 68  Resp: 18  Temp: 98.1 F (36.7 C)  SpO2: 99%   KPS: 70. General: cushingoid  appearance Head: Normal EENT: No conjunctival injection or scleral icterus.  Lungs: Resp effort normal Cardiac: Regular rate Abdomen: Non-distended abdomen Skin: No rashes cyanosis or petechiae. Extremities: No clubbing or edema  Neurologic Exam: Mental Status: Awake, alert, attentive to examiner. Oriented to self and environment. Language c/w transcortical expressive dyshpasia Cranial Nerves: Visual acuity is grossly normal. Right field hemianopia. Extra-ocular movements intact. No ptosis. Face is symmetric Motor: Tone and bulk are normal. 4+/5 in right arm and leg. Reflexes are symmetric, no pathologic reflexes present.  Sensory: Intact to light touch Gait: Dystaxic, hemiparetic  Labs: I have reviewed the data as listed    Component Value Date/Time   NA 141 10/01/2020 1129   K 3.9 10/01/2020 1129   CL 108 10/01/2020 1129   CO2 23 10/01/2020 1129   GLUCOSE 125 (H) 10/01/2020 1129   BUN 18 10/01/2020 1129   CREATININE 0.89 10/01/2020 1129   CREATININE 1.07 09/17/2020 0823   CALCIUM 9.3 10/01/2020 1129   PROT 7.2 10/01/2020  1129   ALBUMIN 3.5 10/01/2020 1129   AST 22 10/01/2020 1129   AST 24 09/17/2020 0823   ALT 72 (H) 10/01/2020 1129   ALT 50 (H) 09/17/2020 0823   ALKPHOS 55 10/01/2020 1129   BILITOT 1.2 10/01/2020 1129   BILITOT 1.2 09/17/2020 0823   GFRNONAA >60 10/01/2020 1129   GFRNONAA >60 09/17/2020 0823   GFRAA >60 06/14/2020 0828   Lab Results  Component Value Date   WBC 10.3 10/18/2020   NEUTROABS 8.9 (H) 10/18/2020   HGB 13.5 10/18/2020   HCT 40.8 10/18/2020   MCV 96.0 10/18/2020   PLT 110 (L) 10/18/2020    Assessment/Plan Glioblastoma, IDH-wildtype ([place]) [C71.9]  Tino Ronan is clinically stable today.  Labs are within normal limits for initiation of Avastin.    We recommended initiating treatment with Avastin 77m/kg IV q2 weeks.  We again reviewed side effects, including bleed/clotting events, hypertension, and wound healing impairment.  We are  hopeful avastin will be useful in treating both inflammation and organic tumor within left hemisphere.  He is agreeable to this plan.  Avastin should be held for the following:  ANC less than 1500  Platelets less than 50,000  LFT or creatinine greater than 2x ULN  If clinical concerns/contraindications develop  We recommended decreasing decadron to 465mdaily if tolerated, starting Monday 10/22/20.   For depression symptoms, will continue zoloft 2567maily.  We ask that MarGustaf Mccarterturn to clinic in 2 weeks for next avastin treatment.  All questions were answered. The patient knows to call the clinic with any problems, questions or concerns. No barriers to learning were detected.  I have spent a total of 30 minutes of face-to-face and non-face-to-face time, excluding clinical staff time, preparing to see patient, ordering tests and/or medications, counseling the patient, and independently interpreting results and communicating results to the patient/family/caregiver    ZacVentura SellersD Medical Director of Neuro-Oncology [facility] WesHolyrood/27/22 12:41 PM

## 2020-10-19 NOTE — Therapy (Signed)
[facility] [address] [place], [place], [address] Phone: [phone]   Fax:  [phone]  Speech Language Pathology Evaluation  Patient Details  Name: Christopher Tucker MRN: [id number] Date of Birth: 1960/12/19 Referring Provider (SLP): Dr. Cecil Cobbs   Encounter Date: 10/17/2020   End of Session - 10/19/20 1053    Visit Number 1    Number of Visits 17    Date for SLP Re-Evaluation 12/14/20           Past Medical History:  Diagnosis Date  . High cholesterol   . History of kidney stones     Past Surgical History:  Procedure Laterality Date  . APPLICATION OF CRANIAL NAVIGATION Left 04/25/2020   Procedure: APPLICATION OF CRANIAL NAVIGATION;  Surgeon: Consuella Lose, MD;  Location: [facility];  Service: Neurosurgery;  Laterality: Left;  . CRANIOTOMY Left 04/25/2020   Procedure: STEREOTACTIC LEFT TEMPORAL CRANIOTOMY FOR TUMOR;  Surgeon: Consuella Lose, MD;  Location: [facility];  Service: Neurosurgery;  Laterality: Left;  . HERNIA REPAIR Left    groin     There were no vitals filed for this visit.   Subjective Assessment - 10/19/20 1039    Subjective "I can't do anything about it, so I just don't bother"    Patient is accompained by: Family member   spouse, Christopher Tucker   Currently in Pain? No/denies              SLP Evaluation [facility] - 10/19/20 1039      SLP Visit Information   SLP Received On 10/17/20    Referring Provider (SLP) Dr. Cecil Cobbs    Onset Date 04/2020    Medical Diagnosis Glioblastoma      Subjective   Patient/Family Stated Goal improve communication      General Information   HPI Pt is a 60 year old male that presents to Neuro OPPT with Grade IV Glioblastoma. Pt has past medical history of HLD and sciatica. Pt started having signs and symptoms of current condition in Spring of 2021 with worsening in the Fall of 2021.    Mobility Status walks independently - on PT caseload      Balance  Screen   Has the patient fallen in the past 6 months No    Has the patient had a decrease in activity level because of a fear of falling?  No    Is the patient reluctant to leave their home because of a fear of falling?  No      Prior Functional Status   Cognitive/Linguistic Baseline Within functional limits    Type of Home House     Lives With Spouse    Vocation Part time employment      Cognition   Overall Cognitive Status Impaired/Different from baseline    Area of Impairment Attention;Memory;Following commands;Awareness;Problem solving    Current Attention Level Sustained    Memory Decreased short-term memory;Decreased recall of precautions    Following Commands Follows one step commands consistently;Follows multi-step commands inconsistently    Awareness Intellectual    Problem Solving Slow processing;Difficulty sequencing;Requires verbal cues    Attention Alternating;Selective    Alternating Attention Impaired    Memory Impaired    Awareness Impaired    Awareness Impairment Intellectual impairment    Executive Function Reasoning;Organizing;Decision Making;Initiating;Self Monitoring;Self Correcting   impaired   Behaviors Impulsive;Verbal agitation      Auditory Comprehension   Overall Auditory Comprehension Impaired    Yes/No Questions Impaired  Commands Impaired    Conversation Simple    Interfering Components Attention;Processing speed;Working Metallurgist;Slowed speech;Visual/Gestural cues      Visual Recognition/Discrimination   Discrimination Not tested      Reading Comprehension   Reading Status Impaired    Word level 76-100% accurate    Sentence Level 76-100% accurate    Paragraph Level Not tested    Functional Environmental (signs, name badge) Within functional limits    Interfering Components Attention;Visual scanning;Impulsivity;Processing time;Working Geneticist, molecular cueing       Expression   Primary Mode of Expression Verbal      Verbal Expression   Overall Verbal Expression Impaired    Initiation Impaired    Automatic Speech --   WNL   Level of Generative/Spontaneous Verbalization Sentence;Conversation    Repetition Impaired    Level of Impairment Sentence level    Naming Impairment    Responsive 76-100% accurate    Confrontation 75-100% accurate    Convergent Not tested    Divergent 25-49% accurate    Verbal Errors Perseveration;Not aware of errors;Phonemic paraphasias;Semantic paraphasias    Pragmatics Impairment    Interfering Components Attention    Effective Techniques Semantic cues;Sentence completion;Phonemic cues      Written Expression   Dominant Hand Right    Written Expression Not tested      Oral Motor/Sensory Function   Overall Oral Motor/Sensory Function Impaired    Labial ROM Within Functional Limits    Labial Symmetry Within Functional Limits    Labial Strength Within Functional Limits    Labial Sensation Within Functional Limits    Labial Coordination Reduced    Lingual ROM Within Functional Limits    Lingual Symmetry Within Functional Limits    Lingual Strength Within Functional Limits    Lingual Sensation Within Functional Limits    Lingual Coordination Reduced    Facial ROM Within Functional Limits    Overall Oral Motor/Sensory Function action tremors lips and tongue,      Motor Speech   Overall Motor Speech Impaired    Respiration Within functional limits    Phonation Normal    Resonance Within functional limits    Articulation Within functional limitis    Intelligibility Intelligible    Motor Planning Impaired    Level of Impairment Phrase    Motor Speech Errors Unaware                           SLP Education - 10/19/20 1053    Education Details aphasia education    Person(s) Educated Patient;Spouse    Methods Explanation;Demonstration;Verbal cues;Handout    Comprehension Verbalized  understanding;Verbal cues required;Need further instruction            SLP Short Term Goals - 10/19/20 1109      SLP SHORT TERM GOAL #1   Title Pt will use external aids to manage schedule, appointments and to do lists with occasional min A from family    Time 4    Period Weeks    Status New      SLP SHORT TERM GOAL #2   Title Pt and spouse will use multimodal communication for auditory comprehension to report reduced frustration/outburst by 50% subjectively    Time 4    Period Weeks    Status New      SLP SHORT TERM GOAL #3   Title Pt will use pre-scripted written  cues to participate in communication with family, food orders, and phone calls with occasional min A from family    Time 4    Period Weeks    Status New      SLP SHORT TERM GOAL #4   Title Pt will use communication notebook to augment verbal expression to communicate information about 3 family members and food preferecnes with occasional min A    Time 4    Period Weeks    Status New            SLP Long Term Goals - 10/19/20 1131      SLP LONG TERM GOAL #1   Title Pt will use low tech AAC communication sheet/book to communicate personal information, preferences and family information with occasional min A over 3 sessions    Time 8    Period Weeks    Status New      SLP LONG TERM GOAL #2   Title Pt will use multimodal communication 4/5 anomic episodes during 15 minute simple conversation with occasional min A    Time 8    Period Weeks    Status New      SLP LONG TERM GOAL #3   Title Pt will use external aids (timers, alarms) to be ready to go to appointments with rare min veral cues from sposue over 3 sessions    Time [facility] - 10/19/20 1054    Clinical Impression Statement Christopher Tucker is referred for outpt ST due to language and cognition impairments with glioblastoma. He is accompanied by his spouse, Christopher Tucker. They report frustration with difficulty word  finding and communiatoin. Christopher Tucker endorses reduced auditory comprehension also results in frustration and Christopher Tucker sometimes becomes agitated. Christopher Tucker became defensive with Christopher Tucker when she shared her concerns. She reports he is not aware of his appointments and has difficulty planning and getting ready on time. Christopher Tucker states that [name] his appointments and he doesn't need to use a calendar, indicating poor awareness. Today he presents with moderate non fluent aphasia, verbal apraxia and cognitive impairments. He was unaware of verbal preseverations even when written cues were provided. Christopher Tucker is halting with marked word finidng difficulties. He was unable to verbalize his address. Picture descrition was telegraphic with semantic paraphasias and some grammatical organization. I demonstrated use of written cues to A Christopher Tucker in comprehending instructions and key words re: conversation topic. I recommend skilled ST to maximize communication and cognition to reduce care giver burden, improve QOL and reduce pt frustration. I also recommend neuropsych counseling for coping strategies if Christopher Tucker and Christopher Tucker are not receiving counseling.    Speech Therapy Frequency 2x / week    Duration 8 weeks   17 visits   Treatment/Interventions Language facilitation;SLP instruction and feedback;Cueing hierarchy;Environmental controls;Compensatory techniques;Cognitive reorganization;Functional tasks;Compensatory strategies;Patient/family education;Multimodal communcation approach;Internal/external aids    Potential to Achieve Goals Good    Potential Considerations Ability to learn/carryover information;Medical prognosis           Patient will benefit from skilled therapeutic intervention in order to improve the following deficits and impairments:   Aphasia  Cognitive communication deficit    Problem List Patient Active Problem List   Diagnosis Date Noted  . Glioblastoma, IDH-wildtype ([facility]) 05/14/2020  . Brain tumor ([facility])  04/25/2020  . HYPERLIPIDEMIA 07/31/2008  . SCIATICA, ACUTE 07/31/2008    Ryley Teater, Annye Rusk MS, CCC-SLP  10/19/2020, 11:36 AM  [facility] [address] [place], [place], [address] Phone: [phone]   Fax:  [phone]  Name: Christopher Tucker MRN: [id number] Date of Birth: April 14, 1961

## 2020-10-22 ENCOUNTER — Other Ambulatory Visit: Payer: Self-pay

## 2020-10-22 ENCOUNTER — Ambulatory Visit: Payer: 59 | Admitting: Speech Pathology

## 2020-10-22 ENCOUNTER — Telehealth: Payer: Self-pay | Admitting: *Deleted

## 2020-10-22 ENCOUNTER — Encounter: Payer: Self-pay | Admitting: Speech Pathology

## 2020-10-22 ENCOUNTER — Ambulatory Visit: Payer: 59 | Admitting: Physical Therapy

## 2020-10-22 ENCOUNTER — Ambulatory Visit: Payer: 59 | Admitting: Occupational Therapy

## 2020-10-22 ENCOUNTER — Encounter: Payer: Self-pay | Admitting: Occupational Therapy

## 2020-10-22 ENCOUNTER — Telehealth: Payer: Self-pay | Admitting: Internal Medicine

## 2020-10-22 VITALS — BP 138/100 | HR 68

## 2020-10-22 DIAGNOSIS — R4701 Aphasia: Secondary | ICD-10-CM

## 2020-10-22 DIAGNOSIS — R41844 Frontal lobe and executive function deficit: Secondary | ICD-10-CM

## 2020-10-22 DIAGNOSIS — R41841 Cognitive communication deficit: Secondary | ICD-10-CM

## 2020-10-22 DIAGNOSIS — R41840 Attention and concentration deficit: Secondary | ICD-10-CM

## 2020-10-22 DIAGNOSIS — M6281 Muscle weakness (generalized): Secondary | ICD-10-CM

## 2020-10-22 DIAGNOSIS — R278 Other lack of coordination: Secondary | ICD-10-CM

## 2020-10-22 DIAGNOSIS — R41842 Visuospatial deficit: Secondary | ICD-10-CM

## 2020-10-22 DIAGNOSIS — R2681 Unsteadiness on feet: Secondary | ICD-10-CM | POA: Diagnosis not present

## 2020-10-22 NOTE — Telephone Encounter (Signed)
Scheduled per 01/27 los, patient has been called notified.

## 2020-10-22 NOTE — Therapy (Signed)
[facility] [address] [place], [place], [address] Phone: [phone]   Fax:  [phone]  Patient Details  Name: Christopher Tucker MRN: [id number] Date of Birth: 10/09/1960 Referring Provider:  [name], MD  Encounter Date: 10/22/2020   Arrived - No Charge   Pt just finished OT session today. Pt's BP at 2:52 PM during OT session was 160/104. Checked again at end of OT session and was 138/100. Pt asymptomatic, but wife reports he has been breathing heavier than normal. Pulse was 68 and SO2 was 97%. Pt currently not on medication for his BP. Pt's wife [name] was on the phone with Dr. Renda Rolls office at the start of the session and made them aware of these values. PT also spoke on the phone to Dr. Renda Rolls nurse and asked for any BP parameters for activity, as at this time diastolic value too high to participate in therapy. Nurse to pass on the information to Dr. [name]. Discussed holding off on PT at this time today due to elevated BP and waiting to hear back from Dr. [name] regarding pt's BP. Both in agreement. BP checked again at 3:50 PM at 120/83 after prolonged seated rest break.   [name], PT, DPT  10/22/2020, 4:17 PM  [facility] [address] [place], [place], [address] Phone: [phone]   Fax:  [phone]

## 2020-10-22 NOTE — Therapy (Signed)
[facility] [address] [place], [place], [address] Phone: [phone]   Fax:  [phone]  Occupational Therapy Treatment  Patient Details  Name: Christopher Tucker MRN: [id number] Date of Birth: 1961-07-10 Referring Provider (OT): Cecil Cobbs MD   Encounter Date: 10/22/2020   OT End of Session - 10/22/20 1443    Visit Number 2    Number of Visits 17    Date for OT Re-Evaluation 12/12/20    Authorization Type [id number]    Authorization Time Period ends Oct 22, 2020 - check w [employer] when new policy starts    OT Start Time 1450    OT Stop Time 1528    OT Time Calculation (min) 38 min    Activity Tolerance Patient tolerated treatment well    Behavior During Therapy [facility] for tasks assessed/performed;Impulsive           Past Medical History:  Diagnosis Date  . High cholesterol   . History of kidney stones     Past Surgical History:  Procedure Laterality Date  . APPLICATION OF CRANIAL NAVIGATION Left 04/25/2020   Procedure: APPLICATION OF CRANIAL NAVIGATION;  Surgeon: Consuella Lose, MD;  Location: [facility];  Service: Neurosurgery;  Laterality: Left;  . CRANIOTOMY Left 04/25/2020   Procedure: STEREOTACTIC LEFT TEMPORAL CRANIOTOMY FOR TUMOR;  Surgeon: Consuella Lose, MD;  Location: [facility];  Service: Neurosurgery;  Laterality: Left;  . HERNIA REPAIR Left    groin     Vitals:   10/22/20 1452 10/22/20 1527  BP: (!) 160/104 (!) 138/100  Pulse: 66 68  SpO2: 99%      Subjective Assessment - 10/22/20 1443    Subjective  Pt denies any pain. Pt with elevated blood pressure at beginning of session.    Patient is accompanied by: Family member   spouse   Limitations Fall Risk. Not Driving. Active Cancer    Patient Stated Goals "fix myself"    Currently in Pain? No/denies                        OT Treatments/Exercises (OP) - 10/22/20 1507      Visual/Perceptual Exercises   Scanning Environmental;Tabletop     Scanning - Environmental 14/16 accuracy 88% on first lap. Pt required increased assistance at first for attention and impulsivity but with cues attended well    Scanning - Tabletop word search with using of visual strategies for scanning entire board with using highlighted line on right margin. Pt reports some difficulty with seeing entire page. Difficulty with understanding, retaining and carryover of strategies - d/t aphasia. worked on copying phone numbers with increased some mix up of numbers and impulsivity but overall pretty good accuracy. Pt's legibility is poor with handwriting.                    OT Short Term Goals - 10/22/20 1538      OT SHORT TERM GOAL #1   Title Pt will be independent with HEP 11/14/2020    Time 4    Period Weeks    Status On-going    Target Date 11/14/20      OT SHORT TERM GOAL #2   Title Pt will perform environmental scanning in moderately distracted environment with 90% accuracy    Time 4    Period Weeks    Status On-going   88%     OT SHORT TERM GOAL #3   Title Pt will complete  basic ADLs with mod I .    Time 4    Period Weeks    Status On-going      OT SHORT TERM GOAL #4   Title Pt will verbalize understanding of energy conservation strategies    Time 4    Period Weeks    Status New      OT SHORT TERM GOAL #5   Title Pt will sustain attention to a cognitive task for 15 minutes with no cues for redirection and completing with 90% accuracy or greater.    Time 4    Period Weeks    Status On-going             OT Long Term Goals - 10/17/20 1507      OT LONG TERM GOAL #1   Title Pt will be independent with updated HEP 12/12/2020    Time 8    Period Weeks    Status New    Target Date 12/12/20      OT LONG TERM GOAL #2   Title Pt will tolerate standing activities for 15 consecutive minutes for increasing standing tolerance and endurance for increasing independence with IADLs.    Time 8    Period Weeks    Status New       OT LONG TERM GOAL #3   Title Pt will read a short paragraph and answer questions with 100% accuracy    Time 8    Period Weeks    Status New      OT LONG TERM GOAL #4   Title Pt will perform physical and cognitive tasks simultaneously with 90% accuracy    Time 8    Period Weeks    Status New      OT LONG TERM GOAL #5   Title Pt will perform 9 hole peg test with BUE with maintaining current fine motor coordination and/or improvement    Baseline RUE 29.47, LUE 25.57    Time 8    Period Weeks    Status New                 Plan - 10/22/20 1450    Clinical Impression Statement Pt returns for first session since initial evaluation for OPOT. Pt with more right sided visual field deficits this day than at eval. Will continue to monitor.    OT Occupational Profile and History Problem Focused Assessment - Including review of records relating to presenting problem    Occupational performance deficits (Please refer to evaluation for details): IADL's;ADL's;Leisure;Work    Marketing executive / Function / Physical Skills ADL;Decreased knowledge of use of DME;Strength;GMC;Dexterity;Proprioception;UE functional use;ROM;IADL;Vision;Coordination;Mobility;FMC    Cognitive Skills Attention;Problem Solve;Safety Awareness;Understand    Rehab Potential Good    Clinical Decision Making Limited treatment options, no task modification necessary    Comorbidities Affecting Occupational Performance: None    Modification or Assistance to Complete Evaluation  No modification of tasks or assist necessary to complete eval    OT Frequency 2x / week    OT Duration 8 weeks   or 16 visits over extended period d/t scheduling   OT Treatment/Interventions Self-care/ADL training;Electrical Stimulation;Ultrasound;DME and/or AE instruction;Balance training;Visual/perceptual remediation/compensation;Patient/family education;Energy conservation;Functional Mobility Training;Neuromuscular education;Cognitive  remediation/compensation;Therapeutic exercise;Therapeutic activities    Plan discuss energy conservation strategies, visual scanning and attenting to right    Consulted and Agree with Plan of Care Patient;Family member/caregiver    Family Member Consulted spouse           Patient will  benefit from skilled therapeutic intervention in order to improve the following deficits and impairments:   Body Structure / Function / Physical Skills: ADL,Decreased knowledge of use of DME,Strength,GMC,Dexterity,Proprioception,UE functional use,ROM,IADL,Vision,Coordination,Mobility,FMC Cognitive Skills: Attention,Problem Solve,Safety Awareness,Understand     Visit Diagnosis: Visuospatial deficit  Other lack of coordination  Attention and concentration deficit  Muscle weakness (generalized)  Frontal lobe and executive function deficit    Problem List Patient Active Problem List   Diagnosis Date Noted  . Glioblastoma, IDH-wildtype ([place]) 05/14/2020  . Brain tumor ([facility]) 04/25/2020  . HYPERLIPIDEMIA 07/31/2008  . SCIATICA, ACUTE 07/31/2008    Zachery Conch MOT, OTR/L  10/22/2020, 3:39 PM  [facility] [address] [place], [place], [address] Phone: [phone]   Fax:  [phone]  Name: Christopher Tucker MRN: [id number] Date of Birth: 1961-07-17

## 2020-10-22 NOTE — Therapy (Signed)
[facility] [address] [place], [place], [address] Phone: [phone]   Fax:  [phone]  Speech Language Pathology Treatment  Patient Details  Name: Christopher Tucker MRN: [id number] Date of Birth: 1960/12/10 Referring Provider (SLP): Dr. Cecil Tucker   Encounter Date: 10/22/2020   End of Session - 10/22/20 1802    Visit Number 2    Number of Visits 17    Date for SLP Re-Evaluation 12/14/20    SLP Start Time 3536    SLP Stop Time  1443    SLP Time Calculation (min) 43 min           Past Medical History:  Diagnosis Date  . High cholesterol   . History of kidney stones     Past Surgical History:  Procedure Laterality Date  . APPLICATION OF CRANIAL NAVIGATION Left 04/25/2020   Procedure: APPLICATION OF CRANIAL NAVIGATION;  Surgeon: Christopher Lose, MD;  Location: [facility];  Service: Neurosurgery;  Laterality: Left;  . CRANIOTOMY Left 04/25/2020   Procedure: STEREOTACTIC LEFT TEMPORAL CRANIOTOMY FOR TUMOR;  Surgeon: Christopher Lose, MD;  Location: [facility];  Service: Neurosurgery;  Laterality: Left;  . HERNIA REPAIR Left    groin     There were no vitals filed for this visit.   Subjective Assessment - 10/22/20 1407    Subjective "I'm good"    Patient is accompained by: Family member   spouse [facility]                ADULT SLP TREATMENT - 10/22/20 1407      General Information   Behavior/Cognition Alert;Cooperative;Pleasant mood      Treatment Provided   Treatment provided Cognitive-Linquistic      Pain Assessment   Pain Assessment No/denies pain      Cognitive-Linquistic Treatment   Treatment focused on Aphasia;Patient/family/caregiver education    Skilled Treatment Initiated communication book for low tech AAC multimodal communication. Christopher Tucker named friends and family members with rare min A. Educated spouse that approximations of words are acceptable if she knows what he means, he doesn't have to  repeat words correctly. It is about the message beingn understood rather than accurate speech. Trained in compensations for auditory comprehension (see pt isntructions). Education provided re: reduced auditory comprehension and ID'd verbal errors Christopher Tucker is not aware of. Trained pt/sposue in strategy of pre-writing some things he wants to say or communicate in phone calls with family and read them so he doesn't have to think of the words on the call. Christopher Tucker is unsure if he will utilize this strategy.      Assessment / Recommendations / Plan   Plan Continue with current plan of care      Progression Toward Goals   Progression toward goals Progressing toward goals              SLP Short Term Goals - 10/22/20 1801      SLP SHORT TERM GOAL #1   Title Pt will use external aids to manage schedule, appointments and to do lists with occasional min A from family    Time 4    Period Weeks    Status On-going      SLP SHORT TERM GOAL #2   Title Pt and spouse will use multimodal communication for auditory comprehension to report reduced frustration/outburst by 50% subjectively    Time 4    Period Weeks    Status On-going      SLP SHORT TERM GOAL #3  Title Pt will use pre-scripted written cues to participate in communication with family, food orders, and phone calls with occasional min A from family    Time 4    Period Weeks    Status On-going      SLP SHORT TERM GOAL #4   Title Pt will use communication notebook to augment verbal expression to communicate information about 3 family members and food preferecnes with occasional min A    Time 4    Period Weeks    Status On-going            SLP Long Term Goals - 10/22/20 1802      SLP LONG TERM GOAL #1   Title Pt will use low tech AAC communication sheet/book to communicate personal information, preferences and family information with occasional min A over 3 sessions    Time 8    Period Weeks    Status On-going      SLP LONG TERM GOAL  #2   Title Pt will use multimodal communication 4/5 anomic episodes during 15 minute simple conversation with occasional min A    Time 8    Period Weeks    Status On-going      SLP LONG TERM GOAL #3   Title Pt will use external aids (timers, alarms) to be ready to go to appointments with rare min veral cues from sposue over 3 sessions    Time 8    Period Weeks    Status On-going            Plan - 10/22/20 1801    Clinical Impression Statement Christopher Tucker is referred for outpt ST due to language and cognition impairments with glioblastoma. He is accompanied by his spouse, Christopher Tucker. They report frustration with difficulty word finding and communiatoin. Christopher Tucker endorses reduced auditory comprehension also results in frustration and Christopher Tucker sometimes becomes agitated. Christopher Tucker became defensive with Christopher Tucker when she shared her concerns. She reports he is not aware of his appointments and has difficulty planning and getting ready on time. Christopher Tucker states that Christopher Tucker his appointments and he doesn't need to use a calendar, indicating poor awareness. Today he presents with moderate non fluent aphasia, verbal apraxia and cognitive impairments. He was unaware of verbal preseverations even when written cues were provided. Christopher Tucker is halting with marked word finidng difficulties. He was unable to verbalize his address. Picture descrition was telegraphic with semantic paraphasias and some grammatical organization. I demonstrated use of written cues to A Christopher Tucker in comprehending instructions and key words re: conversation topic. I recommend skilled ST to maximize communication and cognition to reduce care giver burden, improve QOL and reduce pt frustration. I also recommend neuropsych counseling for coping strategies if Christopher Tucker and Christopher Tucker are not receiving counseling.    Speech Therapy Frequency 2x / week    Duration 8 weeks   17 visits   Treatment/Interventions Language facilitation;SLP instruction and feedback;Cueing  hierarchy;Environmental controls;Compensatory techniques;Cognitive reorganization;Functional tasks;Compensatory strategies;Patient/family education;Multimodal communcation approach;Internal/external aids    Potential to Achieve Goals Good    Potential Considerations Ability to learn/carryover information;Medical prognosis           Patient will benefit from skilled therapeutic intervention in order to improve the following deficits and impairments:   Aphasia  Cognitive communication deficit    Problem List Patient Active Problem List   Diagnosis Date Noted  . Glioblastoma, IDH-wildtype ([facility]) 05/14/2020  . Brain tumor ([place] [place] [facility]) 04/25/2020  . HYPERLIPIDEMIA 07/31/2008  . SCIATICA, ACUTE 07/31/2008  Novia Lansberry, Annye Rusk MS, CCC-SLP 10/22/2020, 6:03 PM  [facility] [address] [place], [place], [address] Phone: [phone]   Fax:  [phone]   Name: Christopher Tucker MRN: [id number] Date of Birth: [birth year]

## 2020-10-22 NOTE — Patient Instructions (Signed)
   It's OK if Rayan is close enough with a word- we are going for ease and reduced frustration not accuracy  Yaden can decide if he wants to be told the word or not  Try some writing of a few words to help Diontae understand or give choices  Energy conservation - pick where you want to spend your energy - if you have a busy day or physical day, you may not talk as good that night  Background noise can make it harder for you to understand   Get the persons attention before you speak  Use eye contact and face the person you are speaking to  Be in close proximity to the person you are speaking to  Turn down any noise in the environment such as the TV, walk away from loud appliances, air conditioners, fans, dish washers etc

## 2020-10-22 NOTE — Telephone Encounter (Signed)
Received call from pt's wife [place]. She and Gleason are at his neuro rehab appt. She states she thinks that he is breathing 'heavier' than she is used to, though Yandriel states he is not SOB.  Also his BP is higher than usual 2 rehab @ 160/104. After rest it was 130/100. Asked if PT could check his pulse and 02. His pulse is 68 and his S02 is 97%  Of note his BP diastolic has been in the 98 range when he has been here in the clinic.   PT stated that they could not continue today's therapy with this elevated BP  Advised that I would let Dr. [name] know about BP  Chloe is the PT and her direct # is [phone]. She would appreciate a call back and also BP parameters to use with his ongoing PT.  Please Advise

## 2020-10-24 ENCOUNTER — Encounter: Payer: Self-pay | Admitting: Speech Pathology

## 2020-10-24 ENCOUNTER — Encounter: Payer: Self-pay | Admitting: Physical Therapy

## 2020-10-24 ENCOUNTER — Ambulatory Visit: Payer: 59 | Admitting: Speech Pathology

## 2020-10-24 ENCOUNTER — Ambulatory Visit: Payer: 59 | Admitting: Occupational Therapy

## 2020-10-24 ENCOUNTER — Ambulatory Visit: Payer: 59 | Attending: Internal Medicine | Admitting: Physical Therapy

## 2020-10-24 ENCOUNTER — Other Ambulatory Visit: Payer: Self-pay

## 2020-10-24 VITALS — BP 141/95 | HR 95

## 2020-10-24 DIAGNOSIS — R41840 Attention and concentration deficit: Secondary | ICD-10-CM | POA: Insufficient documentation

## 2020-10-24 DIAGNOSIS — M6281 Muscle weakness (generalized): Secondary | ICD-10-CM | POA: Insufficient documentation

## 2020-10-24 DIAGNOSIS — R4701 Aphasia: Secondary | ICD-10-CM

## 2020-10-24 DIAGNOSIS — R2681 Unsteadiness on feet: Secondary | ICD-10-CM | POA: Diagnosis present

## 2020-10-24 DIAGNOSIS — R278 Other lack of coordination: Secondary | ICD-10-CM | POA: Diagnosis not present

## 2020-10-24 DIAGNOSIS — R41841 Cognitive communication deficit: Secondary | ICD-10-CM | POA: Insufficient documentation

## 2020-10-24 DIAGNOSIS — R41842 Visuospatial deficit: Secondary | ICD-10-CM | POA: Diagnosis present

## 2020-10-24 DIAGNOSIS — R2689 Other abnormalities of gait and mobility: Secondary | ICD-10-CM | POA: Diagnosis present

## 2020-10-24 DIAGNOSIS — R41844 Frontal lobe and executive function deficit: Secondary | ICD-10-CM | POA: Insufficient documentation

## 2020-10-24 NOTE — Therapy (Addendum)
[name] [address] [place], [place], [address] Phone: [phone]   Fax:  [phone]  Physical Therapy Treatment  Patient Details  Name: Christopher Tucker MRN: [id number] Date of Birth: [birth year] Referring Provider (PT): [name], MD   Encounter Date: 10/24/2020   PT End of Session - 10/25/20 1151    Visit Number 2    Number of Visits 17    Authorization Type [name] (ends jan 31st 2022) - will have to check with gray for insurance info in Feb    PT Start Time 1445    PT Stop Time 1527    PT Time Calculation (min) 42 min    Equipment Utilized During Treatment Gait belt    Activity Tolerance Patient tolerated treatment well   limited by elevated BP   Behavior During Therapy [facility] for tasks assessed/performed           Past Medical History:  Diagnosis Date  . High cholesterol   . History of kidney stones     Past Surgical History:  Procedure Laterality Date  . APPLICATION OF CRANIAL NAVIGATION Left 04/25/2020   Procedure: APPLICATION OF CRANIAL NAVIGATION;  Surgeon: Consuella Lose, MD;  Location: [facility];  Service: Neurosurgery;  Laterality: Left;  . CRANIOTOMY Left 04/25/2020   Procedure: STEREOTACTIC LEFT TEMPORAL CRANIOTOMY FOR TUMOR;  Surgeon: Consuella Lose, MD;  Location: [facility];  Service: Neurosurgery;  Laterality: Left;  . HERNIA REPAIR Left    groin     Vitals:   10/24/20 1506 10/24/20 1514  BP: (!) 151/106 (!) 141/95  Pulse: 75 95     Subjective Assessment - 10/24/20 1448    Subjective No changes since Monday. BP after OT was 140/96.    Pertinent History left temporal anaplastic astrocytoma (s/p Stereotactic left temporal craniotomy for biopsy of L temporal lobe tumor on 04/25/20)    Limitations Walking;House hold activities    Patient Stated Goals improve balance and walking    Currently in Pain? No/denies                           10/24/20 1511   Ambulation/Gait  Ambulation/Gait Yes  Ambulation/Gait Assistance 5: Supervision  Ambulation/Gait Assistance Details trialed  use of R foot up brace for 3 laps of gait for improvements and safety with foot clearance when pt more fatigued or walking longer distances outside. printed out where to buy one in a size large from [employer] and gave handout to pt's spouse. also trialed using a walking pole(they have a walking pole at home)/cane for small distances in case on days where pt is more fatigued/is having more impairments with balance with pt needing initial cues for sequencing, otherwise supervision  Ambulation Distance (Feet) 345 Feet  Assistive device None  Gait Pattern Step-through pattern;Decreased dorsiflexion - left;Poor foot clearance - right  Ambulation Surface Level;Indoor  Therapeutic Activites   Therapeutic Activities Other Therapeutic Activities  Other Therapeutic Activities discussed with pt and pt's spouse monitoring pt's BP at home due to recent elevated values, especially after gait today and to discuss with Dr. [name]. PT called Dr. Renda Rolls office earlier today about pt's BP and has not yet heard back. while pt resting when BP elevated after gait, educated pt with demo cue on how to safely ascend/descend at home with proper technique and to help conserve energy with step to pattern (ascending with stronger LLE first and descending with weaker RLE), pt  verbalized understanding         Access Code: [email or web address] URL: [email or web address] Date: 10/24/2020 Prepared by: Janann [place]  Initial HEP at the countertop for balance:   Exercises Standing Marching - 2 x daily - 3-4 x weekly - 3 sets Tandem Walking with Counter Support - 2 x daily - 3-4 x weekly - 3 sets    PT Education - 10/25/20 1150    Education Details see TA. monitoring BP at home. use of foot up brace for RLE for gait when more fatigued/longer distances/outside, initial HEP for balance     Person(s) Educated Patient;Spouse    Methods Explanation;Demonstration;Handout    Comprehension Verbalized understanding;Returned demonstration            PT Short Term Goals - 10/18/20 0901      PT SHORT TERM GOAL #1   Title Pt will be independent with initial HEP in order to build upon functional gains made in therapy. ALL STGS DUE 11/15/20    Time 4    Period Weeks    Status New    Target Date 11/15/20      PT SHORT TERM GOAL #2   Title Pt will trial R foot up brace for improved foot clearance with gait when more fatigued.    Time 4    Period Weeks    Status New      PT SHORT TERM GOAL #3   Title Pt will improve FGA score to at least 23/30 in order to demo decr fall risk.    Baseline 21/30    Time 4    Period Weeks    Status New      PT SHORT TERM GOAL #4   Title Pt will ambulate at least 200' over outdoor paved surfaces with supervision with no AD vs. LRAD in order to demo improved community mobility.    Time 4    Period Weeks    Status New      PT SHORT TERM GOAL #5   Title Pt and pt's wife will verbalize understanding of fall prevention in the home.    Time 4    Period Weeks    Status New             PT Long Term Goals - 10/18/20 0904      PT LONG TERM GOAL #1   Title Pt will be independent with final HEP in order to build upon functional gains made in therapy. ALL LTGS DUE 12/13/20    Time 8    Period Weeks    Status New    Target Date 12/13/20      PT LONG TERM GOAL #2   Title Pt will improve FGA score to at least 25/30 in order to demo decr fall risk.    Baseline 21/30    Time 8    Period Weeks    Status New      PT LONG TERM GOAL #3   Title Pt will perform at least 3 steps with LRAD vs. step to pattern with no handrail with supevision in order to safely enter/exit house.    Time 8    Period Weeks    Status New      PT LONG TERM GOAL #4   Title Pt will ambulate at least 500' over outdoor paved surfaces with supervision with no AD vs. LRAD  in order to demo improved community mobility    Time 8    Period Weeks  Status New      PT LONG TERM GOAL #5   Title Pt will maintain 5x sit <> stand time of 11.91 seconds or less in order to demo improved functional BLE strength and decr fall risk.    Baseline 11.91 seconds    Time 8    Period Weeks    Status New               10/26/20 1620  Plan  Clinical Impression Statement Today's skilled session limited by diastolic BP  - was 520/761 after 345' of gait and standing balance at countertop, but decr after prolonged seated rest break for WNL for therapy. Pt asymptomatic and Dr. Renda Rolls office aware of pt's elevated BP readings. Today's skilled session focused on initiating an HEP for balance and trialing R foot up brace for help for improved R foot clearance during gait when more fatigued and a single walking pole when pt may feel that his balance is more off. Pt needing initial cues for sequencing. Will continue to progress towards LTGs.  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)  Pt will benefit from skilled therapeutic intervention in order to improve on the following deficits Abnormal gait;Decreased balance;Decreased activity tolerance;Difficulty walking;Decreased strength  Stability/Clinical Decision Making Evolving/Moderate complexity  Rehab Potential Good  PT Frequency 2x / week  PT Duration 8 weeks  PT Treatment/Interventions ADLs/Self Care Home Management;DME Instruction;Gait training;Stair training;Therapeutic activities;Functional mobility training;Therapeutic exercise;Balance training;Neuromuscular re-education;Patient/family education;Vestibular;Energy conservation;Orthotic Fit/Training  PT Next Visit Plan monitor BP. add to HEP for standing balance  with eyes closed. perform stairs. continued functional strength/balance and safety with gait.  Consulted and Agree with Plan of Care Patient      Patient will benefit from skilled therapeutic intervention in order to improve the following deficits and impairments:     Visit Diagnosis: Other lack of coordination  Muscle weakness (generalized)  Other abnormalities of gait and mobility  Unsteadiness on feet     Problem List Patient Active Problem List   Diagnosis Date Noted  . Glioblastoma, IDH-wildtype ([facility]) 05/14/2020  . Brain tumor ([facility]) 04/25/2020  . HYPERLIPIDEMIA 07/31/2008  . SCIATICA, ACUTE 07/31/2008    [name], PT, DPT  10/25/2020, 11:53 AM  [facility] [address] [place], [place], [address] Phone: [phone]   Fax:  [phone]  Name: Christopher Tucker MRN: [id number] Date of Birth: 1960-12-24

## 2020-10-24 NOTE — Patient Instructions (Signed)
   Energy Conservation Techniques  1. Sit for as many activities as possible. 2. Use slow, smooth movements.  Rushing increases discomfort. 3. Determine the necessity of performing the task.  Simplify those tasks that are necessary.  (Get clothes out of the dryer when they are warm instead of ironing, let dishes air dry, etc.) 4. Take frequent rests both during and between activities.  Avoid repetitive tasks. 5. Pre-plan your activities; try a daily and/or weekly schedule.  Spread out the activities that are most fatiguing (break up cleaning tasks over multiple days). 6. Remember to plan a balance of work, rest and recreation. 7. Consider the best time for each activity.  Do the most exertive task when you have the most energy. 8. Don't carry items if you can push them.  Slide, don't lift. Push, don't pull. 9. Utilize two hands when appropriate. 10. Maintain good posture and use proper body mechanics.  Avoid remaining in one position for too long.  When lifting, bend at the knees, not at the waist.  Exhale when bending down, inhale when straightening up.  Carry objects as close to your body and as near to the center of the pelvis.  11. Avoid wasted body movements (position yourself for the task so that you avoid bending, twisting, etc. when possible). 12. Select the best working environment.  Consider lighting, ventilation, clothing, and equipment. 63. Organize your storage areas, making the items you use daily convenient.  Store heaviest items at waist height.  Store frequently used items between shoulders and knee height.  Consider leaving frequently used items on countertops.  (You can organize in storage baskets based on time used/purpose). 14. Feelings and emotions can be real causes of fatigue.  Try to avoid unnecessary worry, irritation, or  frustration.  Avoid stress, it can also be a source of fatigue. 15. Get help from other people for difficult tasks. 16. Explore equipment or  items that may be able to do the job for you with greater ease.  (Electric can openers, blenders, lightweight items for cleaning, etc.)  VISUAL SCANNING STRATEGIES:   1. Look for the edge of objects (to the left and/or right) so that you make sure you are seeing all of an object 2. Turn your head when walking, scan from side to side, particularly in busy environments 3. Use an organized scanning pattern. It's usually easier to scan from top to bottom, and left to right (like you are reading) 4. Double check yourself 5. Use a line guide (like a blank piece of paper) or your finger when reading 6. If necessary, place brightly colored tape at end of table or work area as a reminder to always look until you see the tape.   Activities to try at home to encourage visual scanning:   1. Word searches 2. Mazes 3. Puzzles 4. Card games 5. Computer games and/or searches 6. Connect-the-dots  Activities for environmental (larger) scanning:  1. With supervision, scan for items in grocery store or drugstore.  Begin with a familiar store, then progress to a new store you've never been in before. Make sure you have supervision with this.  2. With supervision, tell a family member or caregiver when it is safe to cross a street after looking all directions and any side streets. However, do NOT cross street unless family member or caregiver is with you and says it is OK

## 2020-10-24 NOTE — Patient Instructions (Signed)
Access Code: [email or web address] URL: [email or web address] Date: 10/24/2020 Prepared by: Janann August  Exercises Standing Marching - 2 x daily - 3-4 x weekly - 3 sets Tandem Walking with Counter Support - 2 x daily - 3-4 x weekly - 3 sets

## 2020-10-24 NOTE — Therapy (Signed)
[facility] [address] [place], [place], [address] Phone: [phone]   Fax:  [phone]  Occupational Therapy Treatment  Patient Details  Name: Christopher Tucker MRN: [id number] Date of Birth: 1961/02/10 Referring Provider (OT): Cecil Cobbs MD   Encounter Date: 10/24/2020   OT End of Session - 10/24/20 1451    Visit Number 3    Number of Visits 17    Date for OT Re-Evaluation 12/12/20    Authorization Type [employer]    Authorization Time Period ends Oct 22, 2020 - check w [employer] when new policy starts    OT Start Time 1400    OT Stop Time 1445    OT Time Calculation (min) 45 min    Activity Tolerance Patient tolerated treatment well    Behavior During Therapy [facility] for tasks assessed/performed;Impulsive           Past Medical History:  Diagnosis Date  . High cholesterol   . History of kidney stones     Past Surgical History:  Procedure Laterality Date  . APPLICATION OF CRANIAL NAVIGATION Left 04/25/2020   Procedure: APPLICATION OF CRANIAL NAVIGATION;  Surgeon: Consuella Lose, MD;  Location: [facility];  Service: Neurosurgery;  Laterality: Left;  . CRANIOTOMY Left 04/25/2020   Procedure: STEREOTACTIC LEFT TEMPORAL CRANIOTOMY FOR TUMOR;  Surgeon: Consuella Lose, MD;  Location: [facility];  Service: Neurosurgery;  Laterality: Left;  . HERNIA REPAIR Left    groin     There were no vitals filed for this visit.   Subjective Assessment - 10/24/20 1405    Patient is accompanied by: Family member   wife [name])   Limitations Fall Risk. Not Driving. Active Cancer    Patient Stated Goals "fix myself"    Currently in Pain? No/denies           Pt issued handouts for energy conservation and visual scanning strategies and reviewed each. Practiced handwriting with various pens - pt had less shaking with Pen Again but was writing smaller with this. Recommended trying ink or felt pen to reduce shaking.  Copying small peg design  for [facility], visual scanning and visual/perceptual skills - pt required cues to slow down, get correct color (pt is color blind and could not distinguish b/t blue and purple but was cued for very distinctly different colors), and correct error d/t impulsivity BP at end of session 140/96, HR = 74                     OT Education - 10/24/20 1409    Education Details energy conservation strategies, visual scanning strategies    Person(s) Educated Patient;Spouse    Methods Explanation;Handout    Comprehension Verbalized understanding            OT Short Term Goals - 10/24/20 1453      OT SHORT TERM GOAL #1   Title Pt will be independent with HEP 11/14/2020    Time 4    Period Weeks    Status On-going    Target Date 11/14/20      OT SHORT TERM GOAL #2   Title Pt will perform environmental scanning in moderately distracted environment with 90% accuracy    Time 4    Period Weeks    Status On-going   88%     OT SHORT TERM GOAL #3   Title Pt will complete basic ADLs with mod I .    Time 4    Period  Weeks    Status Achieved   per pt report     OT SHORT TERM GOAL #4   Title Pt will verbalize understanding of energy conservation strategies    Time 4    Period Weeks    Status Achieved      OT SHORT TERM GOAL #5   Title Pt will sustain attention to a cognitive task for 15 minutes with no cues for redirection and completing with 90% accuracy or greater.    Time 4    Period Weeks    Status On-going             OT Long Term Goals - 10/17/20 1507      OT LONG TERM GOAL #1   Title Pt will be independent with updated HEP 12/12/2020    Time 8    Period Weeks    Status New    Target Date 12/12/20      OT LONG TERM GOAL #2   Title Pt will tolerate standing activities for 15 consecutive minutes for increasing standing tolerance and endurance for increasing independence with IADLs.    Time 8    Period Weeks    Status New      OT LONG TERM GOAL #3   Title Pt will  read a short paragraph and answer questions with 100% accuracy    Time 8    Period Weeks    Status New      OT LONG TERM GOAL #4   Title Pt will perform physical and cognitive tasks simultaneously with 90% accuracy    Time 8    Period Weeks    Status New      OT LONG TERM GOAL #5   Title Pt will perform 9 hole peg test with BUE with maintaining current fine motor coordination and/or improvement    Baseline RUE 29.47, LUE 25.57    Time 8    Period Weeks    Status New                 Plan - 10/24/20 1451    Clinical Impression Statement Pt demo impulsivity and cues to slow down for fine motor/visual perceptual activity    OT Occupational Profile and History Problem Focused Assessment - Including review of records relating to presenting problem    Occupational performance deficits (Please refer to evaluation for details): IADL's;ADL's;Leisure;Work    Marketing executive / Function / Physical Skills ADL;Decreased knowledge of use of DME;Strength;GMC;Dexterity;Proprioception;UE functional use;ROM;IADL;Vision;Coordination;Mobility;FMC    Cognitive Skills Attention;Problem Solve;Safety Awareness;Understand    Rehab Potential Good    Clinical Decision Making Limited treatment options, no task modification necessary    Comorbidities Affecting Occupational Performance: None    Modification or Assistance to Complete Evaluation  No modification of tasks or assist necessary to complete eval    OT Frequency 2x / week    OT Duration 8 weeks   or 16 visits over extended period d/t scheduling   OT Treatment/Interventions Self-care/ADL training;Electrical Stimulation;Ultrasound;DME and/or AE instruction;Balance training;Visual/perceptual remediation/compensation;Patient/family education;Energy conservation;Functional Mobility Training;Neuromuscular education;Cognitive remediation/compensation;Therapeutic exercise;Therapeutic activities    Plan visual scanning and attending to Rt, continue progress  towards goals    Consulted and Agree with Plan of Care Patient;Family member/caregiver    Family Member Consulted spouse           Patient will benefit from skilled therapeutic intervention in order to improve the following deficits and impairments:   Body Structure / Function / Physical Skills: ADL,Decreased knowledge  of use of DME,Strength,GMC,Dexterity,Proprioception,UE functional use,ROM,IADL,Vision,Coordination,Mobility,FMC Cognitive Skills: Attention,Problem Solve,Safety Awareness,Understand     Visit Diagnosis: Visuospatial deficit  Other lack of coordination  Attention and concentration deficit    Problem List Patient Active Problem List   Diagnosis Date Noted  . Glioblastoma, IDH-wildtype ([place]) 05/14/2020  . Brain tumor ([place]) 04/25/2020  . HYPERLIPIDEMIA 07/31/2008  . SCIATICA, ACUTE 07/31/2008    Carey Bullocks, OTR/L 10/24/2020, 2:54 PM  [facility] [address] [place], [place], [address] Phone: [phone]   Fax:  [phone]  Name: Christopher Tucker MRN: [id number] Date of Birth: January 17, 1961

## 2020-10-24 NOTE — Therapy (Signed)
[facility] [address] [place], [place], [address] Phone: [phone]   Fax:  [phone]  Speech Language Pathology Treatment  Patient Details  Name: Christopher Tucker MRN: [id number] Date of Birth: 07/20/1961 Referring Provider (SLP): Dr. Cecil Cobbs   Encounter Date: 10/24/2020   End of Session - 10/24/20 1559    Visit Number 3    Number of Visits 17    Date for SLP Re-Evaluation 12/14/20    SLP Start Time 1232    SLP Stop Time  2878    SLP Time Calculation (min) 45 min    Activity Tolerance Patient tolerated treatment well           Past Medical History:  Diagnosis Date  . High cholesterol   . History of kidney stones     Past Surgical History:  Procedure Laterality Date  . APPLICATION OF CRANIAL NAVIGATION Left 04/25/2020   Procedure: APPLICATION OF CRANIAL NAVIGATION;  Surgeon: Consuella Lose, MD;  Location: [facility];  Service: Neurosurgery;  Laterality: Left;  . CRANIOTOMY Left 04/25/2020   Procedure: STEREOTACTIC LEFT TEMPORAL CRANIOTOMY FOR TUMOR;  Surgeon: Consuella Lose, MD;  Location: [facility];  Service: Neurosurgery;  Laterality: Left;  . HERNIA REPAIR Left    groin     There were no vitals filed for this visit.   Subjective Assessment - 10/24/20 1548    Subjective "Same old stuff"    Patient is accompained by: Family member    Currently in Pain? No/denies                 ADULT SLP TREATMENT - 10/24/20 1233      General Information   Behavior/Cognition Alert;Cooperative;Pleasant mood      Treatment Provided   Treatment provided Cognitive-Linquistic      Cognitive-Linquistic Treatment   Treatment focused on Aphasia;Patient/family/caregiver education    Skilled Treatment [name] and Fortune have not added to communication book yet. Educated [place]ivid[place] and [name] is his impaired safety awareness, problem solving, processing and attention/focus. Targeted education with Key[place] that he is unaware of  thses changes resulting in frustration with [facility] when she feel he is unsafe. Provided handout re reduced awareness and safety concerns. Targeted demonstration of written cues in this education to train spouse on use of written cues with more complex topics/conversations. Modeled pausing, getsures and pointing to the word I was explaining. Franki with frequent questions of we are working on compensations or trying to improve language. I explained both. He agreed to try naming task with speech and writing in divergent naming taks of words from his work and words re: hiking, he wrote and named 10 words for each category with usual min to mod A.      Assessment / Recommendations / Plan   Plan Continue with current plan of care      Progression Toward Goals   Progression toward goals Progressing toward goals            SLP Education - 10/24/20 1557    Education Details cognitive impairments including awareness, decision making and attention; compensations  for aphasia    Person(s) Educated Patient;Spouse    Methods Explanation;Demonstration;Verbal cues;Handout    Comprehension Verbalized understanding;Returned demonstration;Need further instruction            SLP Short Term Goals - 10/24/20 1559      SLP SHORT TERM GOAL #1   Title Pt will use external aids to manage schedule, appointments and to do lists  with occasional min A from family    Time 4    Period Weeks    Status On-going      SLP SHORT TERM GOAL #2   Title Pt and spouse will use multimodal communication for auditory comprehension to report reduced frustration/outburst by 50% subjectively    Time 4    Period Weeks    Status On-going      SLP SHORT TERM GOAL #3   Title Pt will use pre-scripted written cues to participate in communication with family, food orders, and phone calls with occasional min A from family    Time 4    Period Weeks    Status On-going      SLP SHORT TERM GOAL #4   Title Pt will use communication  notebook to augment verbal expression to communicate information about 3 family members and food preferecnes with occasional min A    Time 4    Period Weeks    Status On-going            SLP Long Term Goals - 10/24/20 1559      SLP LONG TERM GOAL #1   Title Pt will use low tech AAC communication sheet/book to communicate personal information, preferences and family information with occasional min A over 3 sessions    Time 8    Period Weeks    Status On-going      SLP LONG TERM GOAL #2   Title Pt will use multimodal communication 4/5 anomic episodes during 15 minute simple conversation with occasional min A    Time 8    Period Weeks    Status On-going      SLP LONG TERM GOAL #3   Title Pt will use external aids (timers, alarms) to be ready to go to appointments with rare min veral cues from sposue over 3 sessions    Time 8    Period Weeks    Status On-going            Plan - 10/24/20 1558    Clinical Impression Statement Christopher Tucker is referred for outpt ST due to language and cognition impairments with glioblastoma. He is accompanied by his spouse, [place]. They report frustration with difficulty word finding and communiatoin. [name] endorses reduced auditory comprehension also results in frustration and Bosco sometimes becomes agitated. Zakaria became defensive with [name] when she shared her concerns. She reports he is not aware of his appointments and has difficulty planning and getting ready on time. Carleton states that [name] his appointments and he doesn't need to use a calendar, indicating poor awareness. Today he presents with moderate non fluent aphasia, verbal apraxia and cognitive impairments. He was unaware of verbal preseverations even when written cues were provided. [name] is halting with marked word finidng difficulties. He was unable to verbalize his address. Picture descrition was telegraphic with semantic paraphasias and some grammatical organization. I  demonstrated use of written cues to A Henery in comprehending instructions and key words re: conversation topic. I recommend skilled ST to maximize communication and cognition to reduce care giver burden, improve QOL and reduce pt frustration. I also recommend neuropsych counseling for coping strategies if Legrand and Dawson are not receiving counseling.    Speech Therapy Frequency 2x / week    Duration 8 weeks   17 visits   Treatment/Interventions Language facilitation;SLP instruction and feedback;Cueing hierarchy;Environmental controls;Compensatory techniques;Cognitive reorganization;Functional tasks;Compensatory strategies;Patient/family education;Multimodal communcation approach;Internal/external aids    Potential to Achieve Goals Good  Potential Considerations Ability to learn/carryover information;Medical prognosis           Patient will benefit from skilled therapeutic intervention in order to improve the following deficits and impairments:   Aphasia  Cognitive communication deficit    Problem List Patient Active Problem List   Diagnosis Date Noted  . Glioblastoma, IDH-wildtype ([place]) 05/14/2020  . Brain tumor ([place]) 04/25/2020  . HYPERLIPIDEMIA 07/31/2008  . SCIATICA, ACUTE 07/31/2008    Mintie Witherington, Annye Rusk 10/24/2020, 4:00 PM  [facility] [address] [place], [place], [address] Phone: [phone]   Fax:  [phone]   Name: Landis Dowdy MS, CCC-SLP MRN: [id number] Date of Birth: 05/30/1961

## 2020-10-24 NOTE — Patient Instructions (Addendum)
   With most brain injuries people have changes in:   Decision making   Safety awareness  Attention and focus  Slow processing and problem solving  And they are not aware of these changes - they are not as obvious to the person with the brain injury  These changes are not "seen" the same way difficulty talking or using your hand are seen  We call this "Awareness" when awareness isn't there, the person can convince others that they are doing better than they are or they can make unsafe decisions     Great strategies of re-reading emails, keeping them short and asking [facility] for help!!

## 2020-10-29 ENCOUNTER — Ambulatory Visit: Payer: 59 | Admitting: Occupational Therapy

## 2020-10-29 ENCOUNTER — Other Ambulatory Visit: Payer: Self-pay

## 2020-10-29 ENCOUNTER — Encounter: Payer: Self-pay | Admitting: Internal Medicine

## 2020-10-29 ENCOUNTER — Ambulatory Visit: Payer: 59

## 2020-10-29 VITALS — BP 134/84

## 2020-10-29 DIAGNOSIS — R2689 Other abnormalities of gait and mobility: Secondary | ICD-10-CM

## 2020-10-29 DIAGNOSIS — R41844 Frontal lobe and executive function deficit: Secondary | ICD-10-CM

## 2020-10-29 DIAGNOSIS — R41842 Visuospatial deficit: Secondary | ICD-10-CM

## 2020-10-29 DIAGNOSIS — R278 Other lack of coordination: Secondary | ICD-10-CM

## 2020-10-29 DIAGNOSIS — R41840 Attention and concentration deficit: Secondary | ICD-10-CM

## 2020-10-29 DIAGNOSIS — R2681 Unsteadiness on feet: Secondary | ICD-10-CM

## 2020-10-29 DIAGNOSIS — M6281 Muscle weakness (generalized): Secondary | ICD-10-CM

## 2020-10-29 NOTE — Therapy (Signed)
[facility] [address] [place], [place], [address] Phone: [phone]   Fax:  [phone]  Occupational Therapy Treatment  Patient Details  Name: Christopher Tucker MRN: [id number] Date of Birth: 04/30/1961 Referring Provider (OT): Cecil Cobbs MD   Encounter Date: 10/29/2020   OT End of Session - 10/29/20 1403    Visit Number 4    Number of Visits 17    Date for OT Re-Evaluation 12/12/20    Authorization Type [employer]IL[employer]    Authorization Time Period ends Oct 22, 2020 - check w [employer] when new policy starts    OT Start Time 1315    OT Stop Time 1400    OT Time Calculation (min) 45 min    Activity Tolerance Patient tolerated treatment well    Behavior During Therapy [facility] for tasks assessed/performed;Impulsive           Past Medical History:  Diagnosis Date  . High cholesterol   . History of kidney stones     Past Surgical History:  Procedure Laterality Date  . APPLICATION OF CRANIAL NAVIGATION Left 04/25/2020   Procedure: APPLICATION OF CRANIAL NAVIGATION;  Surgeon: Consuella Lose, MD;  Location: [facility];  Service: Neurosurgery;  Laterality: Left;  . CRANIOTOMY Left 04/25/2020   Procedure: STEREOTACTIC LEFT TEMPORAL CRANIOTOMY FOR TUMOR;  Surgeon: Consuella Lose, MD;  Location: [facility];  Service: Neurosurgery;  Laterality: Left;  . HERNIA REPAIR Left    groin     There were no vitals filed for this visit.   Subjective Assessment - 10/29/20 1320    Patient is accompanied by: Family member   wife [name])   Limitations Fall Risk. Not Driving. Active Cancer (glioblastoma)    Patient Stated Goals "fix myself"    Currently in Pain? No/denies           Pt performing tabletop scanning: crossing out #'s that repeat 4 times, and crossing out double #'s and counting how many times they appear for cognitive as well as visual component - pt 100% accuracy on both of these worksheets.  Pt performing environmental scanning  finding 13/13 items along hallways.  Pt performing dual tasking b/t physical and cognitive task with mod to max difficulty and mod errors as well as challenges/more difficulty with physical task.  Pt working on alternating attention in constant therapy: finding alternating symbols level 5, 94% accuracy and following directions you hear in everyday tasks level 1 97% accuracy (level 3 at 41% accuracy)                       OT Short Term Goals - 10/24/20 1453      OT SHORT TERM GOAL #1   Title Pt will be independent with HEP 11/14/2020    Time 4    Period Weeks    Status On-going    Target Date 11/14/20      OT SHORT TERM GOAL #2   Title Pt will perform environmental scanning in moderately distracted environment with 90% accuracy    Time 4    Period Weeks    Status On-going   88%     OT SHORT TERM GOAL #3   Title Pt will complete basic ADLs with mod I .    Time 4    Period Weeks    Status Achieved   per pt report     OT SHORT TERM GOAL #4   Title Pt will verbalize understanding of energy conservation  strategies    Time 4    Period Weeks    Status Achieved      OT SHORT TERM GOAL #5   Title Pt will sustain attention to a cognitive task for 15 minutes with no cues for redirection and completing with 90% accuracy or greater.    Time 4    Period Weeks    Status On-going             OT Long Term Goals - 10/17/20 1507      OT LONG TERM GOAL #1   Title Pt will be independent with updated HEP 12/12/2020    Time 8    Period Weeks    Status New    Target Date 12/12/20      OT LONG TERM GOAL #2   Title Pt will tolerate standing activities for 15 consecutive minutes for increasing standing tolerance and endurance for increasing independence with IADLs.    Time 8    Period Weeks    Status New      OT LONG TERM GOAL #3   Title Pt will read a short paragraph and answer questions with 100% accuracy    Time 8    Period Weeks    Status New      OT LONG TERM  GOAL #4   Title Pt will perform physical and cognitive tasks simultaneously with 90% accuracy    Time 8    Period Weeks    Status New      OT LONG TERM GOAL #5   Title Pt will perform 9 hole peg test with BUE with maintaining current fine motor coordination and/or improvement    Baseline RUE 29.47, LUE 25.57    Time 8    Period Weeks    Status New                 Plan - 10/29/20 1415    Clinical Impression Statement Pt demo impulsivity and cues to slow down for tasks today. Pt with decreased attention, impulsivity, and difficulty with dual tasking    OT Occupational Profile and History Problem Focused Assessment - Including review of records relating to presenting problem    Occupational performance deficits (Please refer to evaluation for details): IADL's;ADL's;Leisure;Work    Marketing executive / Function / Physical Skills ADL;Decreased knowledge of use of DME;Strength;GMC;Dexterity;Proprioception;UE functional use;ROM;IADL;Vision;Coordination;Mobility;FMC    Cognitive Skills Attention;Problem Solve;Safety Awareness;Understand    Rehab Potential Good    Clinical Decision Making Limited treatment options, no task modification necessary    Comorbidities Affecting Occupational Performance: None    Modification or Assistance to Complete Evaluation  No modification of tasks or assist necessary to complete eval    OT Frequency 2x / week    OT Duration 8 weeks   or 16 visits over extended period d/t scheduling   OT Treatment/Interventions Self-care/ADL training;Electrical Stimulation;Ultrasound;DME and/or AE instruction;Balance training;Visual/perceptual remediation/compensation;Patient/family education;Energy conservation;Functional Mobility Training;Neuromuscular education;Cognitive remediation/compensation;Therapeutic exercise;Therapeutic activities    Plan continue constant therapy for reading comprehension, following directions you hear level 2.    Consulted and Agree with Plan of  Care Patient;Family member/caregiver    Family Member Consulted spouse           Patient will benefit from skilled therapeutic intervention in order to improve the following deficits and impairments:   Body Structure / Function / Physical Skills: ADL,Decreased knowledge of use of DME,Strength,GMC,Dexterity,Proprioception,UE functional use,ROM,IADL,Vision,Coordination,Mobility,FMC Cognitive Skills: Attention,Problem Solve,Safety Awareness,Understand     Visit Diagnosis: Visuospatial deficit  Attention and concentration deficit  Frontal lobe and executive function deficit    Problem List Patient Active Problem List   Diagnosis Date Noted  . Glioblastoma, IDH-wildtype ([place]) 05/14/2020  . Brain tumor ([place]) 04/25/2020  . HYPERLIPIDEMIA 07/31/2008  . SCIATICA, ACUTE 07/31/2008    Carey Bullocks, OTR/L 10/29/2020, 2:17 PM  [facility] [address] [place], [place], [address] Phone: [phone]   Fax:  [phone]  Name: Christopher Tucker MRN: [id number] Date of Birth: [birth year]

## 2020-10-29 NOTE — Therapy (Signed)
[facility] [address] [place], [place], [address] Phone: [phone]   Fax:  [phone]  Physical Therapy Treatment  Patient Details  Name: Christopher Tucker MRN: [id number] Date of Birth: 1961/07/15 Referring Provider (PT): [name], MD   Encounter Date: 10/29/2020   PT End of Session - 10/29/20 1532    Visit Number 3    Number of Visits 17    Authorization Type [name] (ends jan 31st 2022) - will have to check with gray for insurance info in Feb    PT Start Time 1405    PT Stop Time 1450    PT Time Calculation (min) 45 min    Equipment Utilized During Treatment Gait belt    Activity Tolerance Patient tolerated treatment well    Behavior During Therapy [facility] for tasks assessed/performed;Impulsive           Past Medical History:  Diagnosis Date  . High cholesterol   . History of kidney stones     Past Surgical History:  Procedure Laterality Date  . APPLICATION OF CRANIAL NAVIGATION Left 04/25/2020   Procedure: APPLICATION OF CRANIAL NAVIGATION;  Surgeon: Consuella Lose, MD;  Location: [facility];  Service: Neurosurgery;  Laterality: Left;  . CRANIOTOMY Left 04/25/2020   Procedure: STEREOTACTIC LEFT TEMPORAL CRANIOTOMY FOR TUMOR;  Surgeon: Consuella Lose, MD;  Location: [facility];  Service: Neurosurgery;  Laterality: Left;  . HERNIA REPAIR Left    groin     Vitals:   10/29/20 1530  BP: 134/84     Subjective Assessment - 10/29/20 1402    Subjective No changes to report, havinga good dat today, R foot clearing floor w/o problem    Patient is accompained by: Family member   wife [place]   Pertinent History left temporal anaplastic astrocytoma (s/p Stereotactic left temporal craniotomy for biopsy of L temporal lobe tumor on 04/25/20)    Limitations Walking;House hold activities;Other (comment)   running   Patient Stated Goals improve balance and walking and become more active    Currently in Pain?  No/denies                             [facility] Adult PT Treatment/Exercise - 10/29/20 0001      Transfers   Transfers Sit to Stand;Stand to Sit   performed on rockerboard, 15 reps M/L and A/P requiring UE assist     Ambulation/Gait   Ambulation/Gait Yes    Ambulation/Gait Assistance 5: Supervision    Ambulation Distance (Feet) 230 Feet    Assistive device None    Gait Pattern Decreased dorsiflexion - right    Ambulation Surface Level;Indoor      Exercises   Other Exercises  scifit 10'@L1                Balance Exercises - 10/29/20 0001      Balance Exercises: Standing   Rockerboard Limitations A/P and M/L  rockerboard 3x30, B/single UE/no UE support    Step Ups Forward;Lateral;6 inch   no need of UE support neeeded, performed from airex to 6" step   Step Ups Limitations performed taps and step ups onto 6' sep from solid surface    Sit to Stand Foam/compliant surface   rockerboard   Sit to Stand Limitations performed 15 reps from rockerboard in both M/L and A/P positions               PT Short  Term Goals - 10/18/20 0901      PT SHORT TERM GOAL #1   Title Pt will be independent with initial HEP in order to build upon functional gains made in therapy. ALL STGS DUE 11/15/20    Time 4    Period Weeks    Status New    Target Date 11/15/20      PT SHORT TERM GOAL #2   Title Pt will trial R foot up brace for improved foot clearance with gait when more fatigued.    Time 4    Period Weeks    Status New      PT SHORT TERM GOAL #3   Title Pt will improve FGA score to at least 23/30 in order to demo decr fall risk.    Baseline 21/30    Time 4    Period Weeks    Status New      PT SHORT TERM GOAL #4   Title Pt will ambulate at least 200' over outdoor paved surfaces with supervision with no AD vs. LRAD in order to demo improved community mobility.    Time 4    Period Weeks    Status New      PT SHORT TERM GOAL #5   Title Pt and pt's wife will  verbalize understanding of fall prevention in the home.    Time 4    Period Weeks    Status New             PT Long Term Goals - 10/18/20 0904      PT LONG TERM GOAL #1   Title Pt will be independent with final HEP in order to build upon functional gains made in therapy. ALL LTGS DUE 12/13/20    Time 8    Period Weeks    Status New    Target Date 12/13/20      PT LONG TERM GOAL #2   Title Pt will improve FGA score to at least 25/30 in order to demo decr fall risk.    Baseline 21/30    Time 8    Period Weeks    Status New      PT LONG TERM GOAL #3   Title Pt will perform at least 3 steps with LRAD vs. step to pattern with no handrail with supevision in order to safely enter/exit house.    Time 8    Period Weeks    Status New      PT LONG TERM GOAL #4   Title Pt will ambulate at least 500' over outdoor paved surfaces with supervision with no AD vs. LRAD in order to demo improved community mobility    Time 8    Period Weeks    Status New      PT LONG TERM GOAL #5   Title Pt will maintain 5x sit <> stand time of 11.91 seconds or less in order to demo improved functional BLE strength and decr fall risk.    Baseline 11.91 seconds    Time 8    Period Weeks    Status New                 Plan - 10/29/20 1533    Clinical Impression Statement BP taken after Scifit was [employer], no symptoms reported related to HTN, reports he is having a good day and R foot is clearing floor w/o incidence, expresses he has been "lazy" and has not been as active as he would  like to be    Personal Factors and Comorbidities Comorbidity 1    Comorbidities Glioblastoma Grade IV    Examination-Activity Limitations Stand;Transfers;Stairs;Locomotion Level    Examination-Participation Restrictions Community Activity;Occupation;Driving   works as an Armed forces operational officer    Rehab Potential Good    PT Frequency 2x / week    PT Duration 8 weeks     PT Treatment/Interventions ADLs/Self Care Home Management;DME Instruction;Gait training;Stair training;Therapeutic activities;Functional mobility training;Therapeutic exercise;Balance training;Neuromuscular re-education;Patient/family education;Vestibular;Energy conservation;Orthotic Fit/Training    PT Next Visit Plan monitor BP. add to HEP for standing balance, perform stairs/stepping tasks and incorporate high level balance strategies in an effort to improve R DF and resolve toe clearance issues, continue to encourage patient to exercise at home(walk, row) to [employer] motivation and challenge him mentally    Consulted and Agree with Plan of Care Patient           Patient will benefit from skilled therapeutic intervention in order to improve the following deficits and impairments:  Abnormal gait,Decreased balance,Decreased activity tolerance,Difficulty walking,Decreased strength  Visit Diagnosis: Other lack of coordination  Muscle weakness (generalized)  Other abnormalities of gait and mobility  Unsteadiness on feet     Problem List Patient Active Problem List   Diagnosis Date Noted  . Glioblastoma, IDH-wildtype ([facility]) 05/14/2020  . Brain tumor ([facility]) 04/25/2020  . HYPERLIPIDEMIA 07/31/2008  . SCIATICA, ACUTE 07/31/2008    Lanice Shirts 10/29/2020, 3:43 PM  [facility] [address] [place], [place], [address] Phone: [phone]   Fax:  [phone]  Name: Christopher Tucker MRN: [id number] Date of Birth: 1961/05/04

## 2020-11-02 ENCOUNTER — Other Ambulatory Visit: Payer: Self-pay

## 2020-11-02 ENCOUNTER — Inpatient Hospital Stay: Payer: 59 | Attending: Neurosurgery

## 2020-11-02 ENCOUNTER — Inpatient Hospital Stay: Payer: 59

## 2020-11-02 ENCOUNTER — Inpatient Hospital Stay (HOSPITAL_BASED_OUTPATIENT_CLINIC_OR_DEPARTMENT_OTHER): Payer: 59 | Admitting: Internal Medicine

## 2020-11-02 VITALS — BP 144/102 | HR 70

## 2020-11-02 VITALS — BP 156/103 | HR 91 | Temp 98.0°F | Resp 18 | Ht 71.0 in | Wt 197.9 lb

## 2020-11-02 DIAGNOSIS — C719 Malignant neoplasm of brain, unspecified: Secondary | ICD-10-CM

## 2020-11-02 DIAGNOSIS — Z923 Personal history of irradiation: Secondary | ICD-10-CM | POA: Diagnosis not present

## 2020-11-02 DIAGNOSIS — Z7952 Long term (current) use of systemic steroids: Secondary | ICD-10-CM | POA: Insufficient documentation

## 2020-11-02 DIAGNOSIS — Z87442 Personal history of urinary calculi: Secondary | ICD-10-CM | POA: Insufficient documentation

## 2020-11-02 DIAGNOSIS — I10 Essential (primary) hypertension: Secondary | ICD-10-CM | POA: Diagnosis not present

## 2020-11-02 DIAGNOSIS — F32A Depression, unspecified: Secondary | ICD-10-CM | POA: Insufficient documentation

## 2020-11-02 DIAGNOSIS — Z5112 Encounter for antineoplastic immunotherapy: Secondary | ICD-10-CM | POA: Insufficient documentation

## 2020-11-02 DIAGNOSIS — Z79899 Other long term (current) drug therapy: Secondary | ICD-10-CM | POA: Insufficient documentation

## 2020-11-02 LAB — CBC WITH DIFFERENTIAL (CANCER CENTER ONLY)
Abs Immature Granulocytes: 0.41 10*3/uL — ABNORMAL HIGH (ref 0.00–0.07)
Basophils Absolute: 0 10*3/uL (ref 0.0–0.1)
Basophils Relative: 0 %
Eosinophils Absolute: 0 10*3/uL (ref 0.0–0.5)
Eosinophils Relative: 0 %
HCT: 42.3 % (ref 39.0–52.0)
Hemoglobin: 14.6 g/dL (ref 13.0–17.0)
Immature Granulocytes: 5 %
Lymphocytes Relative: 5 %
Lymphs Abs: 0.4 10*3/uL — ABNORMAL LOW (ref 0.7–4.0)
MCH: 32.6 pg (ref 26.0–34.0)
MCHC: 34.5 g/dL (ref 30.0–36.0)
MCV: 94.4 fL (ref 80.0–100.0)
Monocytes Absolute: 0.5 10*3/uL (ref 0.1–1.0)
Monocytes Relative: 6 %
Neutro Abs: 6.6 10*3/uL (ref 1.7–7.7)
Neutrophils Relative %: 84 %
Platelet Count: 85 10*3/uL — ABNORMAL LOW (ref 150–400)
RBC: 4.48 MIL/uL (ref 4.22–5.81)
RDW: 14.6 % (ref 11.5–15.5)
WBC Count: 7.9 10*3/uL (ref 4.0–10.5)
nRBC: 0.4 % — ABNORMAL HIGH (ref 0.0–0.2)

## 2020-11-02 LAB — COMPREHENSIVE METABOLIC PANEL
ALT: 62 U/L — ABNORMAL HIGH (ref 0–44)
AST: 21 U/L (ref 15–41)
Albumin: 3.3 g/dL — ABNORMAL LOW (ref 3.5–5.0)
Alkaline Phosphatase: 59 U/L (ref 38–126)
Anion gap: 8 (ref 5–15)
BUN: 17 mg/dL (ref 6–20)
CO2: 21 mmol/L — ABNORMAL LOW (ref 22–32)
Calcium: 8.8 mg/dL — ABNORMAL LOW (ref 8.9–10.3)
Chloride: 105 mmol/L (ref 98–111)
Creatinine, Ser: 0.87 mg/dL (ref 0.61–1.24)
GFR, Estimated: >60 mL/min (ref >60)
Glucose, Bld: 131 mg/dL — ABNORMAL HIGH (ref 70–99)
Potassium: 3.9 mmol/L (ref 3.5–5.1)
Sodium: 134 mmol/L — ABNORMAL LOW (ref 135–145)
Total Bilirubin: 1.2 mg/dL (ref 0.3–1.2)
Total Protein: 6.6 g/dL (ref 6.5–8.1)

## 2020-11-02 LAB — TOTAL PROTEIN, URINE DIPSTICK: Protein, ur: NEGATIVE mg/dL

## 2020-11-02 MED ORDER — SODIUM CHLORIDE 0.9 % IV SOLN
10.0000 mg/kg | Freq: Once | INTRAVENOUS | Status: AC
  Administered 2020-11-02: 900 mg via INTRAVENOUS
  Filled 2020-11-02: qty 32

## 2020-11-02 MED ORDER — SODIUM CHLORIDE 0.9 % IV SOLN
Freq: Once | INTRAVENOUS | Status: AC
  Filled 2020-11-02: qty 250

## 2020-11-02 MED ORDER — LISINOPRIL 10 MG PO TABS: 10.0000 mg | ORAL_TABLET | Freq: Every day | ORAL | 3 refills | Status: DC

## 2020-11-02 NOTE — Patient Instructions (Signed)
[facility] Discharge Instructions for Patients Receiving Chemotherapy  Today you received the following chemotherapy agents: bevacizumab.  To help prevent nausea and vomiting after your treatment, we encourage you to take your nausea medication as directed.   If you develop nausea and vomiting that is not controlled by your nausea medication, call the clinic.   BELOW ARE SYMPTOMS THAT SHOULD BE REPORTED IMMEDIATELY:  *FEVER GREATER THAN 100.5 F  *CHILLS WITH OR WITHOUT FEVER  NAUSEA AND VOMITING THAT IS NOT CONTROLLED WITH YOUR NAUSEA MEDICATION  *UNUSUAL SHORTNESS OF BREATH  *UNUSUAL BRUISING OR BLEEDING  TENDERNESS IN MOUTH AND THROAT WITH OR WITHOUT PRESENCE OF ULCERS  *URINARY PROBLEMS  *BOWEL PROBLEMS  UNUSUAL RASH Items with * indicate a potential emergency and should be followed up as soon as possible.  Feel free to call the clinic should you have any questions or concerns. The clinic phone number is [phone].  Please show the [facility] at check-in to the Emergency Department and triage nurse.  Bevacizumab injection What is this medicine? BEVACIZUMAB (be va SIZ yoo mab) is a monoclonal antibody. It is used to treat many types of cancer. This medicine may be used for other purposes; ask your health care provider or pharmacist if you have questions. COMMON BRAND NAME(S): Avastin, MVASI, Zirabev What should I tell my health care provider before I take this medicine? They need to know if you have any of these conditions:  diabetes  heart disease  high blood pressure  history of coughing up blood  prior anthracycline chemotherapy (e.g., doxorubicin, daunorubicin, epirubicin)  recent or ongoing radiation therapy  recent or planning to have surgery  stroke  an unusual or allergic reaction to bevacizumab, hamster proteins, mouse proteins, other medicines, foods, dyes, or preservatives  pregnant or trying to get  pregnant  breast-feeding How should I use this medicine? This medicine is for infusion into a vein. It is given by a health care professional in a hospital or clinic setting. Talk to your pediatrician regarding the use of this medicine in children. Special care may be needed. Overdosage: If you think you have taken too much of this medicine contact a poison control center or emergency room at once. NOTE: This medicine is only for you. Do not share this medicine with others. What if I miss a dose? It is important not to miss your dose. Call your doctor or health care professional if you are unable to keep an appointment. What may interact with this medicine? Interactions are not expected. This list may not describe all possible interactions. Give your health care provider a list of all the medicines, herbs, non-prescription drugs, or dietary supplements you use. Also tell them if you smoke, drink alcohol, or use illegal drugs. Some items may interact with your medicine. What should I watch for while using this medicine? Your condition will be monitored carefully while you are receiving this medicine. You will need important blood work and urine testing done while you are taking this medicine. This medicine may increase your risk to bruise or bleed. Call your doctor or health care professional if you notice any unusual bleeding. Before having surgery, talk to your health care provider to make sure it is ok. This drug can increase the risk of poor healing of your surgical site or wound. You will need to stop this drug for 28 days before surgery. After surgery, wait at least 28 days before restarting this drug. Make sure the surgical  site or wound is healed enough before restarting this drug. Talk to your health care provider if questions. Do not become pregnant while taking this medicine or for 6 months after stopping it. Women should inform their doctor if they wish to become pregnant or think they  might be pregnant. There is a potential for serious side effects to an unborn child. Talk to your health care professional or pharmacist for more information. Do not breast-feed an infant while taking this medicine and for 6 months after the last dose. This medicine has caused ovarian failure in some women. This medicine may interfere with the ability to have a child. You should talk to your doctor or health care professional if you are concerned about your fertility. What side effects may I notice from receiving this medicine? Side effects that you should report to your doctor or health care professional as soon as possible:  allergic reactions like skin rash, itching or hives, swelling of the face, lips, or tongue  chest pain or chest tightness  chills  coughing up blood  high fever  seizures  severe constipation  signs and symptoms of bleeding such as bloody or black, tarry stools; red or dark-brown urine; spitting up blood or brown material that looks like coffee grounds; red spots on the skin; unusual bruising or bleeding from the eye, gums, or nose  signs and symptoms of a blood clot such as breathing problems; chest pain; severe, sudden headache; pain, swelling, warmth in the leg  signs and symptoms of a stroke like changes in vision; confusion; trouble speaking or understanding; severe headaches; sudden numbness or weakness of the face, arm or leg; trouble walking; dizziness; loss of balance or coordination  stomach pain  sweating  swelling of legs or ankles  vomiting  weight gain Side effects that usually do not require medical attention (report to your doctor or health care professional if they continue or are bothersome):  back pain  changes in taste  decreased appetite  dry skin  nausea  tiredness This list may not describe all possible side effects. Call your doctor for medical advice about side effects. You may report side effects to FDA at  1-800-FDA-1088. Where should I keep my medicine? This drug is given in a hospital or clinic and will not be stored at home. NOTE: This sheet is a summary. It may not cover all possible information. If you have questions about this medicine, talk to your doctor, pharmacist, or health care provider.  2021 Elsevier/Gold Standard (2019-07-06 10:50:46)

## 2020-11-02 NOTE — Progress Notes (Signed)
Per Dr. [name], ok to treat with BP of 147/103.

## 2020-11-02 NOTE — Progress Notes (Signed)
[facility] at [place], [place] [address] [phone]   Interval Evaluation  Date of Service: 11/02/20 Patient Name: Christopher Tucker Patient MRN: [id number] Patient DOB: March 15, 1961 Provider: [name], MD  Identifying Statement:  Christopher Tucker is a 60 y.o. male with left temporal anaplastic astrocytoma   Oncologic History: Oncology History  Glioblastoma, IDH-wildtype ([facility] [facility])  05/14/2020 Initial Diagnosis   Anaplastic astrocytoma, IDH-wildtype ([facility])   05/21/2020 - 07/02/2020 Radiation Therapy   IMRT and concurrent Temodar with Dr. Lisbeth Renshaw   07/30/2020 -  Chemotherapy   The patient had dexamethasone (DECADRON) 4 MG tablet, 1 of 1 cycle, Start date: --, End date: -- temozolomide (TEMODAR) 5 MG capsule, 200 mg/m2/day = 410 mg, Oral, Daily, 1 of 1 cycle, Start date: --, End date: -- temozolomide (TEMODAR) 20 MG capsule, 200 mg/m2/day = 400 mg, Oral, Daily, 1 of 1 cycle, Start date: --, End date: -- temozolomide (TEMODAR) 100 MG capsule, 150 mg/m2/day = 300 mg, Oral, Daily, 1 of 1 cycle, Start date: 07/30/2020, End date: 08/04/2020 temozolomide (TEMODAR) 140 MG capsule, 200 mg/m2/day = 420 mg, Oral, Daily, 1 of 1 cycle, Start date: --, End date: --  for chemotherapy treatment.    10/18/2020 -  Chemotherapy    Patient is on Treatment Plan: BRAIN ANAPLASTIC GLIOMA GRADE III TEMOZOLOMIDE POST XRT Q28D   Patient is on Antibody Plan: BRAIN GBM BEVACIZUMAB 14D X 6 CYCLES       Biomarkers:  MGMT Unknown.  IDH 1/2 Wild type.  EGFR Unknown  TERT "Mutated   Interval History:  Christopher Tucker presents today for avastin infusion.  He feels slight improvement in fluidity of speech since initial treatment.  His right side remains weak, and he requires assistance with some  ADLs such as dressing and bathing.  Gait remains independent.  Does complain of ongoing swelling in his face, neck and upper back. Denies seizures or headaches.  Decadron currently  at 47m daily.    Decadron: 10/05/20: 8362m1/28/22: 62m68mH+P (05/10/20) Patient presented to medical attention in early August with several months history of sensory episodes.  They are described as "numbness marching down right arm also involving the leg, lasting for several minutes".  There is no post event weakness or confusion.  No known history of seizures.  In recent weeks he has also experienced some difficulty with expressive language, finding particular words and maintaining high fluency.  Since starting Keppra post-operatively he has not had any recurrence of these sensory events.  He has weaned off dexamethasone.  We are still awaiting finalized pathology results.    Medications: Current Outpatient Medications on File Prior to Visit  Medication Sig Dispense Refill  . dexamethasone (DECADRON) 4 MG tablet TAKE 1 TABLET (4 MG TOTAL) BY MOUTH DAILY. 30 tablet 0  . ibuprofen (ADVIL) 200 MG tablet Take 400 mg by mouth 2 (two) times daily.    . lMarland KitchenvETIRAcetam (KEPPRA) 500 MG tablet Take 1 tablet (500 mg total) by mouth 2 (two) times daily. 60 tablet 1  . ondansetron (ZOFRAN) 8 MG tablet Take 1 tablet (8 mg total) by mouth 2 (two) times daily as needed (nausea and vomiting). May take 30-60 minutes prior to Temodar administration if nausea/vomiting occurs. 30 tablet 1  . sertraline (ZOLOFT) 25 MG tablet Take 1 tablet (25 mg total) by mouth daily. 30 tablet 3  . simvastatin (ZOCOR) 40 MG tablet Take 40 mg by mouth at bedtime.  No current facility-administered medications on file prior to visit.    Allergies: No Known Allergies Past Medical History:  Past Medical History:  Diagnosis Date  . High cholesterol   . History of kidney stones    Past Surgical History:  Past Surgical History:  Procedure Laterality Date  . APPLICATION OF CRANIAL NAVIGATION Left 04/25/2020   Procedure: APPLICATION OF CRANIAL NAVIGATION;  Surgeon: Consuella Lose, MD;  Location: [facility];  Service: Neurosurgery;   Laterality: Left;  . CRANIOTOMY Left 04/25/2020   Procedure: STEREOTACTIC LEFT TEMPORAL CRANIOTOMY FOR TUMOR;  Surgeon: Consuella Lose, MD;  Location: [facility];  Service: Neurosurgery;  Laterality: Left;  . HERNIA REPAIR Left    groin    Social History:  Social History   Socioeconomic History  . Marital status: Married    Spouse name: Not on file  . Number of children: Not on file  . Years of education: Not on file  . Highest education level: Not on file  Occupational History  . Not on file  Tobacco Use  . Smoking status: Never Smoker  . Smokeless tobacco: Never Used  Substance and Sexual Activity  . Alcohol use: Yes    Alcohol/week: 6.0 standard drinks    Types: 3 Glasses of wine, 3 Cans of beer per week  . Drug use: Not Currently  . Sexual activity: Not on file  Other Topics Concern  . Not on file  Social History Narrative  . Not on file   Social Determinants of Health   Financial Resource Strain: Not on file  Food Insecurity: Not on file  Transportation Needs: Not on file  Physical Activity: Not on file  Stress: Not on file  Social Connections: Not on file  Intimate Partner Violence: Not on file   Family History: No family history on file.  Review of Systems: Constitutional: Doesn't report fevers, chills or abnormal weight loss Eyes: Doesn't report blurriness of vision Ears, nose, mouth, throat, and face: Doesn't report sore throat Respiratory: Doesn't report cough, dyspnea or wheezes Cardiovascular: Doesn't report palpitation, chest discomfort  Gastrointestinal:  Doesn't report nausea, constipation, diarrhea GU: Doesn't report incontinence Skin: Doesn't report skin rashes Neurological: Per HPI Musculoskeletal: Doesn't report joint pain Behavioral/Psych: Doesn't report anxiety  Physical Exam: Vitals:   11/02/20 1121  BP: (!) 156/103  Pulse: 91  Resp: 18  Temp: 98 F (36.7 C)  SpO2: 97%   KPS: 70. General: cushingoid appearance Head: Normal EENT:  No conjunctival injection or scleral icterus.  Lungs: Resp effort normal Cardiac: Regular rate Abdomen: Non-distended abdomen Skin: No rashes cyanosis or petechiae. Extremities: No clubbing or edema  Neurologic Exam: Mental Status: Awake, alert, attentive to examiner. Oriented to self and environment. Language c/w transcortical expressive dyshpasia Cranial Nerves: Visual acuity is grossly normal. Right field hemianopia. Extra-ocular movements intact. No ptosis. Face is symmetric Motor: Tone and bulk are normal. 4+/5 in right arm and leg. Reflexes are symmetric, no pathologic reflexes present.  Sensory: Intact to light touch Gait: Dystaxic, hemiparetic  Labs: I have reviewed the data as listed    Component Value Date/Time   NA 139 10/18/2020 1201   K 4.4 10/18/2020 1201   CL 103 10/18/2020 1201   CO2 27 10/18/2020 1201   GLUCOSE 111 (H) 10/18/2020 1201   BUN 26 (H) 10/18/2020 1201   CREATININE 0.84 10/18/2020 1201   CREATININE 1.07 09/17/2020 0823   CALCIUM 8.8 (L) 10/18/2020 1201   PROT 6.6 10/18/2020 1201   ALBUMIN 3.4 (L) 10/18/2020  1201   AST 26 10/18/2020 1201   AST 24 09/17/2020 0823   ALT 60 (H) 10/18/2020 1201   ALT 50 (H) 09/17/2020 0823   ALKPHOS 50 10/18/2020 1201   BILITOT 1.0 10/18/2020 1201   BILITOT 1.2 09/17/2020 0823   GFRNONAA >60 10/18/2020 1201   GFRNONAA >60 09/17/2020 0823   GFRAA >60 06/14/2020 0828   Lab Results  Component Value Date   WBC 10.3 10/18/2020   NEUTROABS 8.9 (H) 10/18/2020   HGB 13.5 10/18/2020   HCT 40.8 10/18/2020   MCV 96.0 10/18/2020   PLT 110 (L) 10/18/2020    Assessment/Plan Glioblastoma, IDH-wildtype ([facility]) [C71.9]  Christopher Tucker is clinically stable today.  Labs are within normal limits for Avastin today.    We recommended continuing treatment with Avastin 5m/kg IV q2 weeks.    Avastin should be held for the following:  ANC less than 1500  Platelets less than 50,000  LFT or creatinine greater than 2x ULN  If  clinical concerns/contraindications develop  For hypertension, likely secondary to avastin, will initiate therapy with Lisinopril 177mdaily.  Should decrease decadron to 63m663maily x7 days, then 1mg6mily thereafter if tolerated.   For depression symptoms, will continue zoloft 25mg83mly.  We ask that Christopher Ysidro Ramsayrn to clinic in 2 weeks for next avastin treatment.  All questions were answered. The patient knows to call the clinic with any problems, questions or concerns. No barriers to learning were detected.  I have spent a total of 30 minutes of face-to-face and non-face-to-face time, excluding clinical staff time, preparing to see patient, ordering tests and/or medications, counseling the patient, and independently interpreting results and communicating results to the patient/family/caregiver    ZachaVentura SellersMedical Director of Neuro-Oncology [facility] Medical CenteresleCiales1/22 11:16 AM

## 2020-11-05 ENCOUNTER — Ambulatory Visit: Payer: 59

## 2020-11-05 ENCOUNTER — Other Ambulatory Visit: Payer: Self-pay

## 2020-11-05 ENCOUNTER — Ambulatory Visit: Payer: 59 | Admitting: Occupational Therapy

## 2020-11-05 DIAGNOSIS — R41842 Visuospatial deficit: Secondary | ICD-10-CM

## 2020-11-05 DIAGNOSIS — R41844 Frontal lobe and executive function deficit: Secondary | ICD-10-CM

## 2020-11-05 DIAGNOSIS — R278 Other lack of coordination: Secondary | ICD-10-CM | POA: Diagnosis not present

## 2020-11-05 DIAGNOSIS — R41840 Attention and concentration deficit: Secondary | ICD-10-CM

## 2020-11-05 NOTE — Therapy (Signed)
[facility] [address] [place], [place], [address] Phone: [phone]   Fax:  [phone]  Occupational Therapy Treatment  Patient Details  Name: Christopher Tucker MRN: [id number] Date of Birth: September 22, 1961 Referring Provider (OT): Cecil Cobbs MD   Encounter Date: 11/05/2020   OT End of Session - 11/05/20 1411    Visit Number 5    Number of Visits 17    Date for OT Re-Evaluation 12/12/20    Authorization Type [employer]    Authorization Time Period ends Oct 22, 2020 - check w [employer] when new policy starts    OT Start Time 1400    OT Stop Time 1415   Pt left early   OT Time Calculation (min) 15 min    Activity Tolerance Patient tolerated treatment well    Behavior During Therapy [facility] for tasks assessed/performed;Impulsive           Past Medical History:  Diagnosis Date  . High cholesterol   . History of kidney stones     Past Surgical History:  Procedure Laterality Date  . APPLICATION OF CRANIAL NAVIGATION Left 04/25/2020   Procedure: APPLICATION OF CRANIAL NAVIGATION;  Surgeon: Consuella Lose, MD;  Location: [facility];  Service: Neurosurgery;  Laterality: Left;  . CRANIOTOMY Left 04/25/2020   Procedure: STEREOTACTIC LEFT TEMPORAL CRANIOTOMY FOR TUMOR;  Surgeon: Consuella Lose, MD;  Location: [facility];  Service: Neurosurgery;  Laterality: Left;  . HERNIA REPAIR Left    groin     There were no vitals filed for this visit.   Subjective Assessment - 11/05/20 1404    Subjective  I just feel irritated and tired. If I can't do this, I will leave    Patient is accompanied by: Family member   wife [name])   Limitations Fall Risk. Not Driving. Active Cancer (glioblastoma)    Patient Stated Goals "fix myself"    Currently in Pain? No/denies           Pt arrived stating he was having a bad day and openingly stated he was more irritated and frustrated today. Pt completed task on constant therapy (following instructions  you hear, level 2 under everyday task) at 67% accuracy having to replay directions multiple times.  Attempted one paragraph reading comprehension on constant therapy (level 1) - however pt was getting frustrated with working ipad and wife later reports that he used to enjoy reading but now cannot because of the tumor. Pt refused to continue with O.T. session or stay for P.T. session following. Discussed with wife pt's frustration and possible options going forward.  Will plan on varying task next session for more hands on cognition to hopefully reduce frustration level.                        OT Short Term Goals - 10/24/20 1453      OT SHORT TERM GOAL #1   Title Pt will be independent with HEP 11/14/2020    Time 4    Period Weeks    Status On-going    Target Date 11/14/20      OT SHORT TERM GOAL #2   Title Pt will perform environmental scanning in moderately distracted environment with 90% accuracy    Time 4    Period Weeks    Status On-going   88%     OT SHORT TERM GOAL #3   Title Pt will complete basic ADLs with mod I .  Time 4    Period Weeks    Status Achieved   per pt report     OT SHORT TERM GOAL #4   Title Pt will verbalize understanding of energy conservation strategies    Time 4    Period Weeks    Status Achieved      OT SHORT TERM GOAL #5   Title Pt will sustain attention to a cognitive task for 15 minutes with no cues for redirection and completing with 90% accuracy or greater.    Time 4    Period Weeks    Status On-going             OT Long Term Goals - 10/17/20 1507      OT LONG TERM GOAL #1   Title Pt will be independent with updated HEP 12/12/2020    Time 8    Period Weeks    Status New    Target Date 12/12/20      OT LONG TERM GOAL #2   Title Pt will tolerate standing activities for 15 consecutive minutes for increasing standing tolerance and endurance for increasing independence with IADLs.    Time 8    Period Weeks    Status  New      OT LONG TERM GOAL #3   Title Pt will read a short paragraph and answer questions with 100% accuracy    Time 8    Period Weeks    Status New      OT LONG TERM GOAL #4   Title Pt will perform physical and cognitive tasks simultaneously with 90% accuracy    Time 8    Period Weeks    Status New      OT LONG TERM GOAL #5   Title Pt will perform 9 hole peg test with BUE with maintaining current fine motor coordination and/or improvement    Baseline RUE 29.47, LUE 25.57    Time 8    Period Weeks    Status New                 Plan - 11/05/20 1454    Clinical Impression Statement Pt with increased irritability and frustration today - pt walked out of session d/t frustration and refused to stay for following P.T. session    OT Occupational Profile and History Problem Focused Assessment - Including review of records relating to presenting problem    Occupational performance deficits (Please refer to evaluation for details): IADL's;ADL's;Leisure;Work    Marketing executive / Function / Physical Skills ADL;Decreased knowledge of use of DME;Strength;GMC;Dexterity;Proprioception;UE functional use;ROM;IADL;Vision;Coordination;Mobility;FMC    Cognitive Skills Attention;Problem Solve;Safety Awareness;Understand    Rehab Potential Good    Clinical Decision Making Limited treatment options, no task modification necessary    Comorbidities Affecting Occupational Performance: None    Modification or Assistance to Complete Evaluation  No modification of tasks or assist necessary to complete eval    OT Frequency 2x / week    OT Duration 8 weeks   or 16 visits over extended period d/t scheduling   OT Treatment/Interventions Self-care/ADL training;Electrical Stimulation;Ultrasound;DME and/or AE instruction;Balance training;Visual/perceptual remediation/compensation;Patient/family education;Energy conservation;Functional Mobility Training;Neuromuscular education;Cognitive  remediation/compensation;Therapeutic exercise;Therapeutic activities    Plan try PVC pipe design, functional cognitive tasks as able    Consulted and Agree with Plan of Care Patient;Family member/caregiver    Family Member Consulted spouse           Patient will benefit from skilled therapeutic intervention in order to improve the  following deficits and impairments:   Body Structure / Function / Physical Skills: ADL,Decreased knowledge of use of DME,Strength,GMC,Dexterity,Proprioception,UE functional use,ROM,IADL,Vision,Coordination,Mobility,FMC Cognitive Skills: Attention,Problem Solve,Safety Awareness,Understand     Visit Diagnosis: Attention and concentration deficit  Frontal lobe and executive function deficit  Visuospatial deficit    Problem List Patient Active Problem List   Diagnosis Date Noted  . Glioblastoma, IDH-wildtype ([facility]) 05/14/2020  . Brain tumor ([facility]) 04/25/2020  . HYPERLIPIDEMIA 07/31/2008  . SCIATICA, ACUTE 07/31/2008    Carey Bullocks, OTR/L 11/05/2020, 2:56 PM  [facility] [address] [place], [place], [address] Phone: [phone]   Fax:  [phone]  Name: Christopher Tucker MRN: [id number] Date of Birth: [birth year]

## 2020-11-07 ENCOUNTER — Ambulatory Visit: Payer: 59 | Admitting: Physical Therapy

## 2020-11-07 ENCOUNTER — Encounter: Payer: Self-pay | Admitting: Speech Pathology

## 2020-11-07 ENCOUNTER — Ambulatory Visit: Payer: 59 | Admitting: Occupational Therapy

## 2020-11-07 ENCOUNTER — Encounter: Payer: Self-pay | Admitting: Physical Therapy

## 2020-11-07 ENCOUNTER — Other Ambulatory Visit: Payer: Self-pay

## 2020-11-07 ENCOUNTER — Ambulatory Visit: Payer: 59 | Admitting: Speech Pathology

## 2020-11-07 VITALS — BP 140/100 | HR 75

## 2020-11-07 DIAGNOSIS — R41841 Cognitive communication deficit: Secondary | ICD-10-CM

## 2020-11-07 DIAGNOSIS — R41842 Visuospatial deficit: Secondary | ICD-10-CM

## 2020-11-07 DIAGNOSIS — R278 Other lack of coordination: Secondary | ICD-10-CM

## 2020-11-07 DIAGNOSIS — R41840 Attention and concentration deficit: Secondary | ICD-10-CM

## 2020-11-07 DIAGNOSIS — R4701 Aphasia: Secondary | ICD-10-CM

## 2020-11-07 DIAGNOSIS — M6281 Muscle weakness (generalized): Secondary | ICD-10-CM

## 2020-11-07 DIAGNOSIS — R41844 Frontal lobe and executive function deficit: Secondary | ICD-10-CM

## 2020-11-07 NOTE — Patient Instructions (Addendum)
   Writing can help Christopher Tucker understand more complex information  Christopher Tucker may try to give clues when he can't think of a word  Great job prepping for phone calls and family conversations  Write down things you may want to say before a phone call to take the pressure off  When you get stressed or frustrated, the words get bottled up more  Some of this may be accessing your vocabulary in your brain and some of it may be your speech muscles are not listening to your brain  Whatever makes communicating easier is what we want - not perfection  It's OK to give Christopher Tucker the word if he wants   Call [employer] 9-1-1 [phone] and let them know you have aphasia and may not be able to communicate easily in an emergency

## 2020-11-07 NOTE — Therapy (Signed)
[facility] [address] [place], [place], [address] Phone: [phone]   Fax:  [phone]  Occupational Therapy Treatment  Patient Details  Name: Christopher Tucker MRN: [id number] Date of Birth: 11/22/1960 Referring Provider (OT): Vaslow, Zachary MD   Encounter Date: 11/07/2020   OT End of Session - 11/07/20 1450    Visit Number 6    Number of Visits 17    Date for OT Re-Evaluation 12/12/20    Authorization Type [employer]IL[employer]    Authorization Time Period ends Oct 22, 2020 - check w [employer] when new policy starts    OT Start Time 1400    OT Stop Time 1440    OT Time Calculation (min) 40 min    Activity Tolerance Patient tolerated treatment well    Behavior During Therapy [facility] for tasks assessed/performed;Impulsive           Past Medical History:  Diagnosis Date  . High cholesterol   . History of kidney stones     Past Surgical History:  Procedure Laterality Date  . APPLICATION OF CRANIAL NAVIGATION Left 04/25/2020   Procedure: APPLICATION OF CRANIAL NAVIGATION;  Surgeon: Nundkumar, Neelesh, MD;  [facility];  Service: Neurosurgery;  Laterality: Left;  . CRANIOTOMY Left 04/25/2020   Procedure: STEREOTACTIC LEFT TEMPORAL CRANIOTOMY FOR TUMOR;  Surgeon: Nundkumar, Neelesh, MD;  Location[facility];  Service: Neurosurgery;  Laterality: Left;  . HERNIA REPAIR Left    groin     There were no vitals filed for this visit.   Subjective Assessment - 11/07/20 1409    Subjective  I'm having a better day today    Patient is accompanied by: Family member  daughter   Limitations Fall Risk. Not Driving. Active Cancer (glioblastoma)    Patient Stated Goals "fix myself"    Currently in Pain? No/denies           Discussed plans to d/c from O.T. with patient and daughter in next 1-2 visits. (Daughter will explain further w/ pt's wife).  Pt copying PVC pipe design (fig 1) w/ initial set up/help organizing pieces. Pt only required  occasional cues when he second guessed himself on correct piece. Advanced to more complex PVC pipe design (fig 14) w/ 100% accuracy. Pt then copied small peg design for coordination, attention, and visual/perceptual skills - pt copied w/ min self corrected errors.  Environmental scanning in min to mod distracting environment with initial cues to go slow - pt found 14/14 on first pass w/ min cues initially to look on both sides                       OT Short Term Goals - 11/07/20 1451      OT SHORT TERM GOAL #1   Title Pt will be independent with HEP 11/14/2020    Time 4    Period Weeks    Status Achieved    Target Date 11/14/20      OT SHORT TERM GOAL #2   Title Pt will perform environmental scanning in moderately distracted environment with 90% accuracy    Time 4    Period Weeks    Status Partially Met   88%, 11/07/20 = 100% w/ initial cues to slow down (remains inconsistent d/t fluctuations in impulsivity and frustration level)     OT SHORT TERM GOAL #3   Title Pt will complete basic ADLs with mod I .    Time 4      Period Weeks    Status Achieved   per pt report     OT SHORT TERM GOAL #4   Title Pt will verbalize understanding of energy conservation strategies    Time 4    Period Weeks    Status Achieved      OT SHORT TERM GOAL #5   Title Pt will sustain attention to a cognitive task for 15 minutes with no cues for redirection and completing with 90% accuracy or greater.    Time 4    Period Weeks    Status Deferred   pt was able to do with simple PVC task today, however unable to progress to more advanced cognitive task            OT Long Term Goals - 11/07/20 1453      OT LONG TERM GOAL #1   Title Pt will be independent with updated HEP 12/12/2020    Time 8    Period Weeks    Status Deferred      OT LONG TERM GOAL #2   Title Pt will tolerate standing activities for 15 consecutive minutes for increasing standing tolerance and endurance for  increasing independence with IADLs.    Time 8    Period Weeks    Status New      OT LONG TERM GOAL #3   Title Pt will read a short paragraph and answer questions with 100% accuracy    Time 8    Period Weeks    Status Deferred   d/t aphasia - consulted w/ SLP     OT LONG TERM GOAL #4   Title Pt will perform physical and cognitive tasks simultaneously with 90% accuracy    Time 8    Period Weeks    Status On-going      OT LONG TERM GOAL #5   Title Pt will perform 9 hole peg test with BUE with maintaining current fine motor coordination and/or improvement    Baseline RUE 29.47, LUE 25.57    Time 8    Period Weeks    Status On-going                 Plan - 11/07/20 1454    Clinical Impression Statement Pt did better today w/ less frustration and impulsivity. This therapist consulted w/ SLP about direction of therapy re: cognitive goals and then discussed w/ pt and daughter plans for d/c from OT in 1-2 visits.    OT Occupational Profile and History Problem Focused Assessment - Including review of records relating to presenting problem    Occupational performance deficits (Please refer to evaluation for details): IADL's;ADL's;Leisure;Work    Body Structure / Function / Physical Skills ADL;Decreased knowledge of use of DME;Strength;GMC;Dexterity;Proprioception;UE functional use;ROM;IADL;Vision;Coordination;Mobility;FMC    Cognitive Skills Attention;Problem Solve;Safety Awareness;Understand    Rehab Potential Good    Clinical Decision Making Limited treatment options, no task modification necessary    Comorbidities Affecting Occupational Performance: None    Modification or Assistance to Complete Evaluation  No modification of tasks or assist necessary to complete eval    OT Frequency 2x / week    OT Duration 8 weeks   or 16 visits over extended period d/t scheduling   OT Treatment/Interventions Self-care/ADL training;Electrical Stimulation;Ultrasound;DME and/or AE  instruction;Balance training;Visual/perceptual remediation/compensation;Patient/family education;Energy conservation;Functional Mobility Training;Neuromuscular education;Cognitive remediation/compensation;Therapeutic exercise;Therapeutic activities    Plan assess LTG's #4 and #5 (#4 may need to be deferred), Pt/family will time standing tasks at home and report back   for LTG #2. Anticipate d/c in next 1-2 visits    Consulted and Agree with Plan of Care Patient;Family member/caregiver    Family Member Consulted Daughter (to discuss w/ pt's wife at home)           Patient will benefit from skilled therapeutic intervention in order to improve the following deficits and impairments:   Body Structure / Function / Physical Skills: ADL,Decreased knowledge of use of DME,Strength,GMC,Dexterity,Proprioception,UE functional use,ROM,IADL,Vision,Coordination,Mobility,FMC Cognitive Skills: Attention,Problem Solve,Safety Awareness,Understand     Visit Diagnosis: Attention and concentration deficit  Frontal lobe and executive function deficit  Visuospatial deficit  Other lack of coordination    Problem List Patient Active Problem List   Diagnosis Date Noted  . Glioblastoma, IDH-wildtype ([place]) 05/14/2020  . Brain tumor ([facility]) 04/25/2020  . HYPERLIPIDEMIA 07/31/2008  . SCIATICA, ACUTE 07/31/2008    Carey Bullocks, OTR/L 11/07/2020, 2:58 PM  [facility] [address] [place], [place], [address] Phone: [phone]   Fax:  [phone]  Name: Christopher Tucker MRN: [id number] Date of Birth: 08-24-1961

## 2020-11-07 NOTE — Therapy (Signed)
[facility] [address] [place], [place], [address] Phone: [phone]   Fax:  [phone]  Physical Therapy Treatment- Arrived No Charge  Patient Details  Name: Christopher Tucker MRN: [id number] Date of Birth: Jul 10, 1961 Referring Provider (PT): [name], MD   Encounter Date: 11/07/2020   PT End of Session - 11/07/20 1518    Visit Number 3   arrived - no charge   Number of Visits 17    Authorization Type [name] (ends jan 31st 2022) - will have to check with [employer] for insurance info in Feb    PT Start Time 1443    PT Stop Time 1500    PT Time Calculation (min) 17 min    Behavior During Therapy [facility] for tasks assessed/performed           Past Medical History:  Diagnosis Date  . High cholesterol   . History of kidney stones     Past Surgical History:  Procedure Laterality Date  . APPLICATION OF CRANIAL NAVIGATION Left 04/25/2020   Procedure: APPLICATION OF CRANIAL NAVIGATION;  Surgeon: Consuella Lose, MD;  Location: [facility];  Service: Neurosurgery;  Laterality: Left;  . CRANIOTOMY Left 04/25/2020   Procedure: STEREOTACTIC LEFT TEMPORAL CRANIOTOMY FOR TUMOR;  Surgeon: Consuella Lose, MD;  Location: [facility];  Service: Neurosurgery;  Laterality: Left;  . HERNIA REPAIR Left    groin     Vitals:   11/07/20 1453 11/07/20 1455  BP: (!) 136/99 (!) 140/100  Pulse: 75      Subjective Assessment - 11/07/20 1447    Subjective Was feeling more frustrated on monday, but feeling better today. Thinks speech is not making anything better, thinks that he will take a break from it for a couple weeks. Thinks his MRI is coming up next month. Dr. [name] prescribed him Lisinopril 76m daily for his elevated BP.    Patient is accompained by: Family member   wife HLas Cruces  Pertinent History left temporal anaplastic astrocytoma (s/p Stereotactic left temporal craniotomy for biopsy of L temporal lobe tumor on 04/25/20)     Limitations Walking;House hold activities;Other (comment)   running   Patient Stated Goals improve balance and walking and become more active    Currently in Pain? No/denies              Assessed pt's BP at start of session with manual cuff at 136/99, assessed with manual cuff at 140/100. Pt took his BP medication this morning (was started a couple days ago and just takes 1x per day) and just got done with speech and OT sessions where pt was sitting throughout. Pt asymptomatic. Discussed with pt will hold off at this time due to elevated BP at rest after prolonged sitting even with BP medication. Pt reports he has been checking it in the morning and it has been better the past couple days, but was high last night. Discussed with pt monitoring it a couple times a day and not just in the morning and make Dr. VMickeal Skinneraware of elevated values later on in the day in case something needs to be adjusted or pt may still be getting adjusted to medication. Discussed with pt continuing exercise program at this time (pt walking, performing row machine) and doing balance exercises from PT [facility] Pt and pt's daughter in agreement.  PT Short Term Goals - 10/18/20 0901      PT SHORT TERM GOAL #1   Title Pt will be independent with initial HEP in order to build upon functional gains made in therapy. ALL STGS DUE 11/15/20    Time 4    Period Weeks    Status New    Target Date 11/15/20      PT SHORT TERM GOAL #2   Title Pt will trial R foot up brace for improved foot clearance with gait when more fatigued.    Time 4    Period Weeks    Status New      PT SHORT TERM GOAL #3   Title Pt will improve FGA score to at least 23/30 in order to demo decr fall risk.    Baseline 21/30    Time 4    Period Weeks    Status New      PT SHORT TERM GOAL #4   Title Pt will ambulate at least 200' over outdoor paved surfaces with supervision with no  AD vs. LRAD in order to demo improved community mobility.    Time 4    Period Weeks    Status New      PT SHORT TERM GOAL #5   Title Pt and pt's wife will verbalize understanding of fall prevention in the home.    Time 4    Period Weeks    Status New             PT Long Term Goals - 10/18/20 0904      PT LONG TERM GOAL #1   Title Pt will be independent with final HEP in order to build upon functional gains made in therapy. ALL LTGS DUE 12/13/20    Time 8    Period Weeks    Status New    Target Date 12/13/20      PT LONG TERM GOAL #2   Title Pt will improve FGA score to at least 25/30 in order to demo decr fall risk.    Baseline 21/30    Time 8    Period Weeks    Status New      PT LONG TERM GOAL #3   Title Pt will perform at least 3 steps with LRAD vs. step to pattern with no handrail with supevision in order to safely enter/exit house.    Time 8    Period Weeks    Status New      PT LONG TERM GOAL #4   Title Pt will ambulate at least 500' over outdoor paved surfaces with supervision with no AD vs. LRAD in order to demo improved community mobility    Time 8    Period Weeks    Status New      PT LONG TERM GOAL #5   Title Pt will maintain 5x sit <> stand time of 11.91 seconds or less in order to demo improved functional BLE strength and decr fall risk.    Baseline 11.91 seconds    Time 8    Period Weeks    Status New                  Patient will benefit from skilled therapeutic intervention in order to improve the following deficits and impairments:     Visit Diagnosis: Muscle weakness (generalized)     Problem List Patient Active Problem List   Diagnosis Date Noted  . Glioblastoma, IDH-wildtype ([facility]) 05/14/2020  .  Brain tumor ([facility]) 04/25/2020  . HYPERLIPIDEMIA 07/31/2008  . SCIATICA, ACUTE 07/31/2008    [name], PT, DPT  11/07/2020, 3:19 PM  [facility] [address] [place], [place], [address] Phone: [phone]   Fax:  [phone]  Name: Christopher Tucker MRN: [id number] Date of Birth: May 23, 1961

## 2020-11-07 NOTE — Therapy (Signed)
[facility] [address] [place], [place], [address] Phone: [phone]   Fax:  [phone]  Speech Language Pathology Treatment  Patient Details  Name: Christopher Tucker MRN: [id number] Date of Birth: 1961-09-12 Referring Provider (SLP): Dr. Cecil Cobbs   Encounter Date: 11/07/2020   End of Session - 11/07/20 1810    Visit Number 4    Number of Visits 17    Date for SLP Re-Evaluation 12/14/20    SLP Start Time 67    SLP Stop Time  1400    SLP Time Calculation (min) 42 min    Activity Tolerance Patient tolerated treatment well           Past Medical History:  Diagnosis Date  . High cholesterol   . History of kidney stones     Past Surgical History:  Procedure Laterality Date  . APPLICATION OF CRANIAL NAVIGATION Left 04/25/2020   Procedure: APPLICATION OF CRANIAL NAVIGATION;  Surgeon: Consuella Lose, MD;  Location: [facility];  Service: Neurosurgery;  Laterality: Left;  . CRANIOTOMY Left 04/25/2020   Procedure: STEREOTACTIC LEFT TEMPORAL CRANIOTOMY FOR TUMOR;  Surgeon: Consuella Lose, MD;  Location: [facility];  Service: Neurosurgery;  Laterality: Left;  . HERNIA REPAIR Left    groin     There were no vitals filed for this visit.   Subjective Assessment - 11/07/20 1343    Subjective "I left the other one" re: leaving OT session    Patient is accompained by: Family member    Currently in Pain? No/denies                 ADULT SLP TREATMENT - 11/07/20 1344      General Information   Behavior/Cognition Alert;Cooperative;Pleasant mood      Treatment Provided   Treatment provided Cognitive-Linquistic      Cognitive-Linquistic Treatment   Treatment focused on Aphasia;Patient/family/caregiver education    Skilled Treatment Christopher Tucker is accompanied by his daughter, Christopher Tucker. Educated daughter on strategies to [employer] with communication, including questioning cues, encourageing Christopher Tucker to describe (give clues) when  they don't understand what he is saying, and use written cues. Discussed functional ways to make communication easier and accepting approximations, gestures and paraphasias vs correcting and having him repeat, which results in frustration. They have carried over strategy of Face Time vs phone conversations to improve ease of communication. Christopher Tucker has written down and pre-planned phrases for work [employer] with success. Today we targeted verbal compensations for dysnomia in structured task. Christopher Tucker was able to generate 2-4 salient descriptions of simple objects for ST and Christopher Tucker to accurately ID the word he was describing. Christopher Tucker agrees to try this at home when he is able. Christopher Tucker educated in cues to encourage her dad to use verbal compensations for aphsia. Phonemic paraphasias persist, but are often understood, per daughter.      Assessment / Recommendations / Plan   Plan Continue with current plan of care      Progression Toward Goals   Progression toward goals Progressing toward goals            SLP Education - 11/07/20 1803    Education Details compensations for aphasia; multi modal Investment banker, operational) Educated Patient;Child(ren)    Methods Explanation;Demonstration;Verbal cues;Handout    Comprehension Verbalized understanding;Returned demonstration;Verbal cues required            SLP Short Term Goals - 11/07/20 1808      SLP SHORT TERM GOAL #1  Title Pt will use external aids to manage schedule, appointments and to do lists with occasional min A from family    Time 3    Period Weeks    Status On-going      SLP SHORT TERM GOAL #2   Title Pt and spouse will use multimodal communication for auditory comprehension to report reduced frustration/outburst by 50% subjectively    Time 3    Period Weeks    Status On-going      SLP SHORT TERM GOAL #3   Title Pt will use pre-scripted written cues to participate in communication with family, food orders, and phone calls with  occasional min A from family    Baseline 11/07/20;    Time 3    Period Weeks    Status On-going      SLP SHORT TERM GOAL #4   Title Pt will use communication notebook to augment verbal expression to communicate information about 3 family members and food preferecnes with occasional min A    Baseline 11/07/20;    Time 3    Period Weeks    Status On-going            SLP Long Term Goals - 11/07/20 1809      SLP LONG TERM GOAL #1   Title Pt will use low tech AAC communication sheet/book to communicate personal information, preferences and family information with occasional min A over 3 sessions    Time 7    Period Weeks    Status On-going      SLP LONG TERM GOAL #2   Title Pt will use multimodal communication 4/5 anomic episodes during 15 minute simple conversation with occasional min A    Time 7    Period Weeks    Status On-going      SLP LONG TERM GOAL #3   Title Pt will use external aids (timers, alarms) to be ready to go to appointments with rare min veral cues from sposue over 3 sessions    Time 7    Period Weeks    Status On-going            Plan - 11/07/20 1804    Clinical Impression Statement Christopher Tucker continues to present with moderate non fluent aphasia and mild cognitive impairments, including awraeness and safety awareness. He endorses this in Christopher Tucker, however continues to verbalize frustration when his wife does not want him to do activities she feels are unsafe. Initiated training in multimodal communcation, use of low tech AAC with family, friends and perferred places on Forensic scientist. Ongoing training of family in strategies to improve ease and efficiency of communcation over accuracy of grammare and word production. Continue skilled ST to maximize carryover of compensations for communciation and cognitive impairments for safety, reduce frustration and caregiver burden and improve QOL    Speech Therapy Frequency 2x / week    Duration 8 weeks   17 visits    Treatment/Interventions Language facilitation;SLP instruction and feedback;Cueing hierarchy;Environmental controls;Compensatory techniques;Cognitive reorganization;Functional tasks;Compensatory strategies;Patient/family education;Multimodal communcation approach;Internal/external aids    Potential to Achieve Goals Good    Potential Considerations Medical prognosis           Patient will benefit from skilled therapeutic intervention in order to improve the following deficits and impairments:   Aphasia  Cognitive communication deficit    Problem List Patient Active Problem List   Diagnosis Date Noted  . Glioblastoma, IDH-wildtype ([facility] [place]) 05/14/2020  . Brain tumor ([facility]) 04/25/2020  . HYPERLIPIDEMIA 07/31/2008  .  SCIATICA, ACUTE 07/31/2008    Lucyann Romano, Annye Rusk MS, CCC-SLP 11/07/2020, 6:11 PM  [facility] [address] [place], [place], [address] Phone: [phone]   Fax:  [phone]   Name: Christopher Tucker MRN: [id number] Date of Birth: Sep 11, 1961

## 2020-11-08 ENCOUNTER — Encounter: Payer: Self-pay | Admitting: Internal Medicine

## 2020-11-08 ENCOUNTER — Telehealth: Payer: Self-pay

## 2020-11-08 NOTE — Telephone Encounter (Signed)
I confirmed with Christopher Tucker that if they do decide to go forward with palliative care they would still be coming to see Dr. [name] for appointments. Dr. [name] was notified of this.

## 2020-11-08 NOTE — Telephone Encounter (Signed)
-----   Message from [name], MD sent at 11/08/2020  9:31 AM EST ----- Of course.  Just want to verify- will they be keeping appointments with [place]?   ----- Message ----- From: Burna Mortimer, CMA Sent: 11/08/2020   8:58 AM EST To: [name], MD  Dr. [name],  Marzetta Board with [facility] has called to inform you that [name] is requesting for them to follow the patient to palliative care. They would like to know if you are agreeable to this. Please advise.   Thanks, Myriam Jacobson

## 2020-11-09 ENCOUNTER — Telehealth: Payer: Self-pay | Admitting: Internal Medicine

## 2020-11-09 ENCOUNTER — Telehealth: Payer: Self-pay | Admitting: Nurse Practitioner

## 2020-11-09 NOTE — Telephone Encounter (Signed)
Spoke with patient's wife, [name], regarding the Palliative referral and she wanted to call me back after she got home to discuss this.

## 2020-11-09 NOTE — Telephone Encounter (Signed)
Scheduled per 02/11 los, patient has been called and notified. 

## 2020-11-09 NOTE — Telephone Encounter (Signed)
Rec'd return call from wife and discussed Palliative services with her and all questions were answered and she was in agreement with scheduling visit.  I scheduled an In-home Consult for 11/20/20 @ 8:30 AM

## 2020-11-14 ENCOUNTER — Encounter: Payer: Self-pay | Admitting: Speech Pathology

## 2020-11-14 ENCOUNTER — Encounter: Payer: Self-pay | Admitting: Physical Therapy

## 2020-11-14 ENCOUNTER — Ambulatory Visit: Payer: 59 | Admitting: Speech Pathology

## 2020-11-14 ENCOUNTER — Other Ambulatory Visit: Payer: Self-pay

## 2020-11-14 ENCOUNTER — Ambulatory Visit: Payer: 59 | Admitting: Physical Therapy

## 2020-11-14 VITALS — BP 119/86 | HR 88

## 2020-11-14 DIAGNOSIS — M6281 Muscle weakness (generalized): Secondary | ICD-10-CM

## 2020-11-14 DIAGNOSIS — R2681 Unsteadiness on feet: Secondary | ICD-10-CM

## 2020-11-14 DIAGNOSIS — R278 Other lack of coordination: Secondary | ICD-10-CM | POA: Diagnosis not present

## 2020-11-14 DIAGNOSIS — R4701 Aphasia: Secondary | ICD-10-CM

## 2020-11-14 DIAGNOSIS — R2689 Other abnormalities of gait and mobility: Secondary | ICD-10-CM

## 2020-11-14 DIAGNOSIS — R41841 Cognitive communication deficit: Secondary | ICD-10-CM

## 2020-11-14 NOTE — Patient Instructions (Signed)
Access Code: [email or web address] URL: [email or web address] Date: 11/14/2020 Prepared by: Janann August  Exercises Standing Marching - 1-2 x daily - 4 x weekly - 3 sets Tandem Walking with Counter Support - 1-2 x daily - 4 x weekly - 3 sets Heel Toe Raises with Counter Support - 1-2 x daily - 4 x weekly - 2 sets - 10 reps Romberg Stance Eyes Closed on Foam Pad - 1-2 x daily - 4 x weekly - 3 sets - 30 reps Narrow Stance with Eyes Closed and Head Nods on Foam Pad - 1-2 x daily - 4 x weekly - 2 sets - 10 reps Narrow Stance with Eyes Closed and Head Rotation on Foam Pad - 1-2 x daily - 4 x weekly - 2 sets - 10 reps

## 2020-11-14 NOTE — Therapy (Addendum)
[name] [address] [place], [place], [address] Phone: [phone]   Fax:  [phone]  Physical Therapy Treatment  Patient Details  Name: Christopher Tucker MRN: [id number] Date of Birth: [birth year] Referring Provider (PT): Christopher Sellers, MD   Encounter Date: 11/14/2020   PT End of Session - 11/14/20 1543    Visit Number 4    Number of Visits 17    Authorization Type Christopher Tucker (ends jan 31st 2022) - will have to check with gray for insurance info in Feb    PT Start Time 1447    PT Stop Time 1527    PT Time Calculation (min) 40 min    Activity Tolerance Patient tolerated treatment well    Behavior During Therapy [facility] for tasks assessed/performed           Past Medical History:  Diagnosis Date  . High cholesterol   . History of kidney stones     Past Surgical History:  Procedure Laterality Date  . APPLICATION OF CRANIAL NAVIGATION Left 04/25/2020   Procedure: APPLICATION OF CRANIAL NAVIGATION;  Surgeon: Christopher Lose, MD;  Location: [facility];  Service: Neurosurgery;  Laterality: Left;  . CRANIOTOMY Left 04/25/2020   Procedure: STEREOTACTIC LEFT TEMPORAL CRANIOTOMY FOR TUMOR;  Surgeon: Christopher Lose, MD;  Location: [facility];  Service: Neurosurgery;  Laterality: Left;  . HERNIA REPAIR Left    groin     Vitals:   11/14/20 1451  BP: 119/86  Pulse: 88     Subjective Assessment - 11/14/20 1449    Subjective Lisinopril has increased to 20 mg in the morning. Taking a break from [place] and OT.    Patient is accompained by: Family member   wife Christopher Tucker   Pertinent History left temporal anaplastic astrocytoma (s/p Stereotactic left temporal craniotomy for biopsy of L temporal lobe tumor on 04/25/20)    Limitations Walking;House hold activities;Other (comment)   running   Patient Stated Goals improve balance and walking and become more active    Currently in Pain? No/denies                          Access Code: [email or web address] URL: [email or web address] Date: 11/14/2020 Prepared by: Christopher Tucker  Reviewed previous exercises and added additional ones as appropriate for balance. See MedBridge for more details.  Exercises Standing Marching - 1-2 x daily - 4 x weekly - 3 sets  Tandem Walking with Counter Support - 1-2 x daily - 4 x weekly - 3 sets Heel Toe Raises with Counter Support - 1-2 x daily - 4 x weekly - 2 sets - 10 reps Romberg Stance Eyes Closed on Foam Pad - 1-2 x daily - 4 x weekly - 3 sets - 30 reps Narrow Stance with Eyes Closed and Head Nods on Foam Pad - 1-2 x daily - 4 x weekly - 2 sets - 10 reps Narrow Stance with Eyes Closed and Head Rotation on Foam Pad - 1-2 x daily - 4 x weekly - 2 sets - 10 reps   NMR: Standing on blue air ex for SLS alternating forward toe taps to 2 cones, x10 reps B with single UE support and progressing to none x10 reps with min guard for balance               PT Education - 11/14/20 1542    Education Details initial HEP, dropping down to 1x week for PT and pt to  work on walking program, rowing  machine, and balance exercises at home.    Person(s) Educated Patient;Spouse    Methods Explanation;Demonstration;Verbal cues;Handout    Comprehension Verbalized understanding;Returned demonstration            PT Short Term Goals - 10/18/20 0901      PT SHORT TERM GOAL #1   Title Pt will be independent with initial HEP in order to build upon functional gains made in therapy. ALL STGS DUE 11/15/20    Time 4    Period Weeks    Status New    Target Date 11/15/20      PT SHORT TERM GOAL #2   Title Pt will trial R foot up brace for improved foot clearance with gait when more fatigued.    Time 4    Period Weeks    Status New      PT SHORT TERM GOAL #3   Title Pt will improve FGA score to at least 23/30 in order to demo decr fall risk.    Baseline 21/30    Time 4    Period Weeks    Status New      PT SHORT TERM GOAL #4    Title Pt will ambulate at least 200' over outdoor paved surfaces with supervision with no AD vs. LRAD in order to demo improved community mobility.    Time 4    Period Weeks    Status New      PT SHORT TERM GOAL #5   Title Pt and pt's wife will verbalize understanding of fall prevention in the home.    Time 4    Period Weeks    Status New             PT Long Term Goals - 10/18/20 0904      PT LONG TERM GOAL #1   Title Pt will be independent with final HEP in order to build upon functional gains made in therapy. ALL LTGS DUE 12/13/20    Time 8    Period Weeks    Status New    Target Date 12/13/20      PT LONG TERM GOAL #2   Title Pt will improve FGA score to at least 25/30 in order to demo decr fall risk.    Baseline 21/30    Time 8    Period Weeks    Status New      PT LONG TERM GOAL #3   Title Pt will perform at least 3 steps with LRAD vs. step to pattern with no handrail with supevision in order to safely enter/exit house.    Time 8    Period Weeks    Status New      PT LONG TERM GOAL #4   Title Pt will ambulate at least 500' over outdoor paved surfaces with supervision with no AD vs. LRAD in order to demo improved community mobility    Time 8    Period Weeks    Status New      PT LONG TERM GOAL #5   Title Pt will maintain 5x sit <> stand time of 11.91 seconds or less in order to demo improved functional BLE strength and decr fall risk.    Baseline 11.91 seconds    Time 8    Period Weeks    Status New               11/14/20 1723  Plan  Clinical Impression Statement Pt  had BP medication adjusted by Dr. Mickeal Tucker - [facility] to participate in therapy today. Focused on reviewing previous exercises (slow marching and tandem gait at counter) and added in corner balance for vestibular input and heel <> toe raises. Pt wishing to drop down to 1x week with pt performing balance exercises from PT and rowing/walking at home for aerobic exercise.  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)  Pt will benefit from skilled therapeutic intervention in order to improve on the following deficits Abnormal gait;Decreased balance;Decreased activity tolerance;Difficulty walking;Decreased strength  Stability/Clinical Decision Making Evolving/Moderate complexity  Rehab Potential Good  PT Frequency 2x / week  PT Duration 8 weeks  PT Treatment/Interventions ADLs/Self Care Home Management;DME Instruction;Gait training;Stair training;Therapeutic activities;Functional mobility training;Therapeutic exercise;Balance training;Neuromuscular re-education;Patient/family education;Vestibular;Energy conservation;Orthotic Fit/Training  PT Next Visit Plan how is HEP? functional strengthening, balance for vestibular input and eyes closed.  PT Home Exercise Plan [email or web address]  Consulted and Agree with Plan of Care Patient        Patient will benefit from skilled therapeutic intervention in order to improve the following deficits and impairments:     Visit Diagnosis: Other lack of coordination  Muscle weakness (generalized)  Other abnormalities of gait and mobility  Unsteadiness on feet     Problem List Patient Active Problem List   Diagnosis Date Noted  . Glioblastoma, IDH-wildtype ([place]) 05/14/2020  . Brain tumor ([facility]) 04/25/2020  . HYPERLIPIDEMIA 07/31/2008  . SCIATICA, ACUTE 07/31/2008    [name], PT, DPT  11/14/2020, 3:45 PM  [facility] [address] [place], [place], [address] Phone: [phone]   Fax:  [phone]  Name: Christopher Tucker MRN: [id number] Date of Birth: 28-Oct-1960

## 2020-11-14 NOTE — Patient Instructions (Signed)
   Have your work emails proof read   Continue to participate in socialization - if you can't do a long visit, limit it to shorter visits  Think about Face Time a friend for a short visit  It would be great to see at least 1 friend a week  H20 is a new exhibit at [facility] may enjoy, it starts 11/24/20  Keep participating in conversation - work on getting your words out when you are feeling good and can work on it

## 2020-11-15 ENCOUNTER — Inpatient Hospital Stay: Payer: 59

## 2020-11-15 ENCOUNTER — Other Ambulatory Visit: Payer: Self-pay

## 2020-11-15 ENCOUNTER — Inpatient Hospital Stay (HOSPITAL_BASED_OUTPATIENT_CLINIC_OR_DEPARTMENT_OTHER): Payer: 59 | Admitting: Internal Medicine

## 2020-11-15 VITALS — Wt 198.1 lb

## 2020-11-15 VITALS — BP 121/88 | HR 85 | Temp 97.2°F | Resp 20 | Ht 71.0 in | Wt 145.8 lb

## 2020-11-15 DIAGNOSIS — C719 Malignant neoplasm of brain, unspecified: Secondary | ICD-10-CM | POA: Diagnosis not present

## 2020-11-15 DIAGNOSIS — Z5112 Encounter for antineoplastic immunotherapy: Secondary | ICD-10-CM | POA: Diagnosis not present

## 2020-11-15 LAB — COMPREHENSIVE METABOLIC PANEL
ALT: 48 U/L — ABNORMAL HIGH (ref 0–44)
AST: 18 U/L (ref 15–41)
Albumin: 3.3 g/dL — ABNORMAL LOW (ref 3.5–5.0)
Alkaline Phosphatase: 64 U/L (ref 38–126)
Anion gap: 10 (ref 5–15)
BUN: 14 mg/dL (ref 6–20)
CO2: 22 mmol/L (ref 22–32)
Calcium: 8.7 mg/dL — ABNORMAL LOW (ref 8.9–10.3)
Chloride: 107 mmol/L (ref 98–111)
Creatinine, Ser: 0.82 mg/dL (ref 0.61–1.24)
GFR, Estimated: >60 mL/min (ref >60)
Glucose, Bld: 107 mg/dL — ABNORMAL HIGH (ref 70–99)
Potassium: 4.2 mmol/L (ref 3.5–5.1)
Sodium: 139 mmol/L (ref 135–145)
Total Bilirubin: 1 mg/dL (ref 0.3–1.2)
Total Protein: 6.8 g/dL (ref 6.5–8.1)

## 2020-11-15 LAB — CBC WITH DIFFERENTIAL/PLATELET
Abs Immature Granulocytes: 0.35 10*3/uL — ABNORMAL HIGH (ref 0.00–0.07)
Basophils Absolute: 0 10*3/uL (ref 0.0–0.1)
Basophils Relative: 0 %
Eosinophils Absolute: 0 10*3/uL (ref 0.0–0.5)
Eosinophils Relative: 0 %
HCT: 38.2 % — ABNORMAL LOW (ref 39.0–52.0)
Hemoglobin: 13.3 g/dL (ref 13.0–17.0)
Immature Granulocytes: 5 %
Lymphocytes Relative: 7 %
Lymphs Abs: 0.6 10*3/uL — ABNORMAL LOW (ref 0.7–4.0)
MCH: 33.3 pg (ref 26.0–34.0)
MCHC: 34.8 g/dL (ref 30.0–36.0)
MCV: 95.7 fL (ref 80.0–100.0)
Monocytes Absolute: 0.7 10*3/uL (ref 0.1–1.0)
Monocytes Relative: 8 %
Neutro Abs: 6.2 10*3/uL (ref 1.7–7.7)
Neutrophils Relative %: 80 %
Platelets: 128 10*3/uL — ABNORMAL LOW (ref 150–400)
RBC: 3.99 MIL/uL — ABNORMAL LOW (ref 4.22–5.81)
RDW: 14.8 % (ref 11.5–15.5)
WBC: 7.7 10*3/uL (ref 4.0–10.5)
nRBC: 0.3 % — ABNORMAL HIGH (ref 0.0–0.2)

## 2020-11-15 LAB — TOTAL PROTEIN, URINE DIPSTICK: Protein, ur: NEGATIVE mg/dL

## 2020-11-15 MED ORDER — SODIUM CHLORIDE 0.9 % IV SOLN
10.0000 mg/kg | Freq: Once | INTRAVENOUS | Status: AC
  Administered 2020-11-15: 900 mg via INTRAVENOUS
  Filled 2020-11-15: qty 32

## 2020-11-15 MED ORDER — SODIUM CHLORIDE 0.9 % IV SOLN
Freq: Once | INTRAVENOUS | Status: AC
  Filled 2020-11-15: qty 250

## 2020-11-15 NOTE — Progress Notes (Signed)
[facility], [place] [address] [phone]   Interval Evaluation  Date of Service: 11/15/20 Patient Name: Christopher Tucker Patient MRN: [id number] Patient DOB: 1960-11-30 Provider: [name], MD  Identifying Statement:  Bashir Marchetti is a 60 y.o. male with left temporal anaplastic astrocytoma   Oncologic History: Oncology History  Glioblastoma, IDH-wildtype ([facility])  05/14/2020 Initial Diagnosis   Anaplastic astrocytoma, IDH-wildtype ([facility])   05/21/2020 - 07/02/2020 Radiation Therapy   IMRT and concurrent Temodar with Dr. Lisbeth Renshaw   07/30/2020 -  Chemotherapy   The patient had dexamethasone (DECADRON) 4 MG tablet, 1 of 1 cycle, Start date: --, End date: -- temozolomide (TEMODAR) 5 MG capsule, 200 mg/m2/day = 410 mg, Oral, Daily, 1 of 1 cycle, Start date: --, End date: -- temozolomide (TEMODAR) 20 MG capsule, 200 mg/m2/day = 400 mg, Oral, Daily, 1 of 1 cycle, Start date: --, End date: -- temozolomide (TEMODAR) 100 MG capsule, 150 mg/m2/day = 300 mg, Oral, Daily, 1 of 1 cycle, Start date: 07/30/2020, End date: 08/04/2020 temozolomide (TEMODAR) 140 MG capsule, 200 mg/m2/day = 420 mg, Oral, Daily, 1 of 1 cycle, Start date: --, End date: --  for chemotherapy treatment.    10/18/2020 -  Chemotherapy    Patient is on Treatment Plan: BRAIN ANAPLASTIC GLIOMA GRADE III TEMOZOLOMIDE POST XRT Q28D   Patient is on Antibody Plan: BRAIN GBM BEVACIZUMAB 14D X 6 CYCLES       Biomarkers:  MGMT Unknown.  IDH 1/2 Wild type.  EGFR Unknown  TERT "Mutated   Interval History:  Nasser Ku presents today for avastin infusion.  He feels slight improvement in fluidity of speech since initial treatment.  His right side remains weak, and he requires assistance with some  ADLs such as dressing and bathing.  Gait remains independent.  Does complain of ongoing swelling in his face, neck and upper back. Denies seizures or headaches.  Decadron has  dropped down to 33m daily.    Decadron: 10/05/20: 885m1/28/22: 61m661m/24/22: 1mg28m+P (05/10/20) Patient presented to medical attention in early August with several months history of sensory episodes.  They are described as "numbness marching down right arm also involving the leg, lasting for several minutes".  There is no post event weakness or confusion.  No known history of seizures.  In recent weeks he has also experienced some difficulty with expressive language, finding particular words and maintaining high fluency.  Since starting Keppra post-operatively he has not had any recurrence of these sensory events.  He has weaned off dexamethasone.  We are still awaiting finalized pathology results.    Medications: Current Outpatient Medications on File Prior to Visit  Medication Sig Dispense Refill  . dexamethasone (DECADRON) 4 MG tablet TAKE 1 TABLET (4 MG TOTAL) BY MOUTH DAILY. 30 tablet 0  . ibuprofen (ADVIL) 200 MG tablet Take 400 mg by mouth 2 (two) times daily.    . leMarland KitchenETIRAcetam (KEPPRA) 500 MG tablet Take 1 tablet (500 mg total) by mouth 2 (two) times daily. 60 tablet 1  . lisinopril (PRINIVIL) 10 MG tablet Take 1 tablet (10 mg total) by mouth daily. (Patient taking differently: Take 10 mg by mouth 2 (two) times daily.) 30 tablet 3  . ondansetron (ZOFRAN) 8 MG tablet Take 1 tablet (8 mg total) by mouth 2 (two) times daily as needed (nausea and vomiting). May take 30-60 minutes prior to Temodar administration if nausea/vomiting occurs. 30 tablet 1  .  sertraline (ZOLOFT) 25 MG tablet Take 1 tablet (25 mg total) by mouth daily. 30 tablet 3  . simvastatin (ZOCOR) 40 MG tablet Take 40 mg by mouth at bedtime.     No current facility-administered medications on file prior to visit.    Allergies: No Known Allergies Past Medical History:  Past Medical History:  Diagnosis Date  . High cholesterol   . History of kidney stones    Past Surgical History:  Past Surgical History:  Procedure  Laterality Date  . APPLICATION OF CRANIAL NAVIGATION Left 04/25/2020   Procedure: APPLICATION OF CRANIAL NAVIGATION;  Surgeon: Consuella Lose, MD;  Location: [facility];  Service: Neurosurgery;  Laterality: Left;  . CRANIOTOMY Left 04/25/2020   Procedure: STEREOTACTIC LEFT TEMPORAL CRANIOTOMY FOR TUMOR;  Surgeon: Consuella Lose, MD;  Location: [facility];  Service: Neurosurgery;  Laterality: Left;  . HERNIA REPAIR Left    groin    Social History:  Social History   Socioeconomic History  . Marital status: Married    Spouse name: Not on file  . Number of children: Not on file  . Years of education: Not on file  . Highest education level: Not on file  Occupational History  . Not on file  Tobacco Use  . Smoking status: Never Smoker  . Smokeless tobacco: Never Used  Substance and Sexual Activity  . Alcohol use: Yes    Alcohol/week: 6.0 standard drinks    Types: 3 Glasses of wine, 3 Cans of beer per week  . Drug use: Not Currently  . Sexual activity: Not on file  Other Topics Concern  . Not on file  Social History Narrative  . Not on file   Social Determinants of Health   Financial Resource Strain: Not on file  Food Insecurity: Not on file  Transportation Needs: Not on file  Physical Activity: Not on file  Stress: Not on file  Social Connections: Not on file  Intimate Partner Violence: Not on file   Family History: No family history on file.  Review of Systems: Constitutional: Doesn't report fevers, chills or abnormal weight loss Eyes: Doesn't report blurriness of vision Ears, nose, mouth, throat, and face: Doesn't report sore throat Respiratory: Doesn't report cough, dyspnea or wheezes Cardiovascular: Doesn't report palpitation, chest discomfort  Gastrointestinal:  Doesn't report nausea, constipation, diarrhea GU: Doesn't report incontinence Skin: Doesn't report skin rashes Neurological: Per HPI Musculoskeletal: Doesn't report joint pain Behavioral/Psych: Doesn't report  anxiety  Physical Exam: Vitals:   11/15/20 1247  BP: 121/88  Pulse: 85  Resp: 20  Temp: (!) 97.2 F (36.2 C)  SpO2: 99%   KPS: 70. General: cushingoid appearance Head: Normal EENT: No conjunctival injection or scleral icterus.  Lungs: Resp effort normal Cardiac: Regular rate Abdomen: Non-distended abdomen Skin: No rashes cyanosis or petechiae. Extremities: No clubbing or edema  Neurologic Exam: Mental Status: Awake, alert, attentive to examiner. Oriented to self and environment. Language c/w transcortical expressive dyshpasia Cranial Nerves: Visual acuity is grossly normal. Right field hemianopia. Extra-ocular movements intact. No ptosis. Face is symmetric Motor: Tone and bulk are normal. 4+/5 in right arm and leg. Reflexes are symmetric, no pathologic reflexes present.  Sensory: Intact to light touch Gait: Dystaxic, hemiparetic  Labs: I have reviewed the data as listed    Component Value Date/Time   NA 134 (L) 11/02/2020 1105   K 3.9 11/02/2020 1105   CL 105 11/02/2020 1105   CO2 21 (L) 11/02/2020 1105   GLUCOSE 131 (H) 11/02/2020 1105  BUN 17 11/02/2020 1105   CREATININE 0.87 11/02/2020 1105   CREATININE 1.07 09/17/2020 0823   CALCIUM 8.8 (L) 11/02/2020 1105   PROT 6.6 11/02/2020 1105   ALBUMIN 3.3 (L) 11/02/2020 1105   AST 21 11/02/2020 1105   AST 24 09/17/2020 0823   ALT 62 (H) 11/02/2020 1105   ALT 50 (H) 09/17/2020 0823   ALKPHOS 59 11/02/2020 1105   BILITOT 1.2 11/02/2020 1105   BILITOT 1.2 09/17/2020 0823   GFRNONAA >60 11/02/2020 1105   GFRNONAA >60 09/17/2020 0823   GFRAA >60 06/14/2020 0828   Lab Results  Component Value Date   WBC 7.7 11/15/2020   NEUTROABS 6.2 11/15/2020   HGB 13.3 11/15/2020   HCT 38.2 (L) 11/15/2020   MCV 95.7 11/15/2020   PLT 128 (L) 11/15/2020    Assessment/Plan Glioblastoma, IDH-wildtype ([facility]) [C71.9]  Marie Chow is clinically stable today.  Labs are within normal limits for Avastin today.    We recommended  continuing treatment with Avastin 85m/kg IV q2 weeks.    Avastin should be held for the following:  ANC less than 1500  Platelets less than 50,000  LFT or creatinine greater than 2x ULN  If clinical concerns/contraindications develop  Should continue Lisinopril 176mdaily for HTN.   May discontinue decadron if tolerated.  For depression symptoms, will continue zoloft 2566maily.  We ask that MarJag Lenzturn to clinic in 2 weeks for next avastin treatment.  Next MRI brain should be in 3 weeks.  All questions were answered. The patient knows to call the clinic with any problems, questions or concerns. No barriers to learning were detected.  I have spent a total of 30 minutes of face-to-face and non-face-to-face time, excluding clinical staff time, preparing to see patient, ordering tests and/or medications, counseling the patient, and independently interpreting results and communicating results to the patient/family/caregiver    ZacVentura SellersD Medical Director of Neuro-Oncology [facility] [place]/24/22 12:59 PM

## 2020-11-15 NOTE — Patient Instructions (Signed)
[facility] Discharge Instructions for Patients Receiving Chemotherapy  Today you received the following chemotherapy agents: Christopher Tucker  To help prevent nausea and vomiting after your treatment, we encourage you to take your nausea medication as directed   If you develop nausea and vomiting that is not controlled by your nausea medication, call the clinic.   BELOW ARE SYMPTOMS THAT SHOULD BE REPORTED IMMEDIATELY:  *FEVER GREATER THAN 100.5 F  *CHILLS WITH OR WITHOUT FEVER  NAUSEA AND VOMITING THAT IS NOT CONTROLLED WITH YOUR NAUSEA MEDICATION  *UNUSUAL SHORTNESS OF BREATH  *UNUSUAL BRUISING OR BLEEDING  TENDERNESS IN MOUTH AND THROAT WITH OR WITHOUT PRESENCE OF ULCERS  *URINARY PROBLEMS  *BOWEL PROBLEMS  UNUSUAL RASH Items with * indicate a potential emergency and should be followed up as soon as possible.  Feel free to call the clinic should you have any questions or concerns. The clinic phone number is [phone].  Please show the [facility] at check-in to the Emergency Department and triage nurse.

## 2020-11-16 ENCOUNTER — Other Ambulatory Visit: Payer: Self-pay | Admitting: Internal Medicine

## 2020-11-16 ENCOUNTER — Other Ambulatory Visit: Payer: 59

## 2020-11-16 ENCOUNTER — Ambulatory Visit: Payer: 59 | Admitting: Occupational Therapy

## 2020-11-16 ENCOUNTER — Ambulatory Visit: Payer: 59 | Admitting: Physical Therapy

## 2020-11-16 ENCOUNTER — Ambulatory Visit: Payer: 59 | Admitting: Speech Pathology

## 2020-11-16 ENCOUNTER — Ambulatory Visit: Payer: 59 | Admitting: Internal Medicine

## 2020-11-16 ENCOUNTER — Telehealth: Payer: Self-pay | Admitting: *Deleted

## 2020-11-16 MED ORDER — LISINOPRIL 10 MG PO TABS: 20.0000 mg | ORAL_TABLET | Freq: Every day | ORAL | 3 refills | Status: DC

## 2020-11-16 NOTE — Telephone Encounter (Signed)
Patient called stating that he was instructed yesterday to increase his lisinopril to BID which means he will run out of his  RX earlier. States at home blood pressure readings are within normal limits.  Needs new Rx routed to [facility].  Routed to Dr [name] to review since yesterday's office note does not specify increase to BID.

## 2020-11-16 NOTE — Therapy (Signed)
[facility] [address] [place], [place], [address] Phone: [phone]   Fax:  [phone]  Speech Language Pathology Treatment  Patient Details  Name: Christopher Tucker MRN: [id number] Date of Birth: 1961/02/11 Referring Provider (SLP): Dr. Cecil Cobbs   Encounter Date: 11/14/2020   End of Session - 11/16/20 1349    Visit Number 5    Number of Visits 17    Date for SLP Re-Evaluation 12/14/20    SLP Start Time 8182    SLP Stop Time  1445    SLP Time Calculation (min) 43 min    Activity Tolerance Patient tolerated treatment well           Past Medical History:  Diagnosis Date  . High cholesterol   . History of kidney stones     Past Surgical History:  Procedure Laterality Date  . APPLICATION OF CRANIAL NAVIGATION Left 04/25/2020   Procedure: APPLICATION OF CRANIAL NAVIGATION;  Surgeon: Consuella Lose, MD;  Location: [facility];  Service: Neurosurgery;  Laterality: Left;  . CRANIOTOMY Left 04/25/2020   Procedure: STEREOTACTIC LEFT TEMPORAL CRANIOTOMY FOR TUMOR;  Surgeon: Consuella Lose, MD;  Location: [facility];  Service: Neurosurgery;  Laterality: Left;  . HERNIA REPAIR Left    groin     There were no vitals filed for this visit.              SLP Short Term Goals - 11/16/20 1348      SLP SHORT TERM GOAL #1   Title Pt will use external aids to manage schedule, appointments and to do lists with occasional min A from family    Time 2    Period Weeks    Status On-going      SLP SHORT TERM GOAL #2   Title Pt and spouse will use multimodal communication for auditory comprehension to report reduced frustration/outburst by 50% subjectively    Time 2    Period Weeks    Status On-going      SLP SHORT TERM GOAL #3   Title Pt will use pre-scripted written cues to participate in communication with family, food orders, and phone calls with occasional min A from family    Baseline 11/07/20;    Time 2     Period Weeks    Status On-going      SLP SHORT TERM GOAL #4   Title Pt will use communication notebook to augment verbal expression to communicate information about 3 family members and food preferecnes with occasional min A    Baseline 11/07/20;    Time 2    Period Weeks    Status On-going            SLP Long Term Goals - 11/16/20 1349      SLP LONG TERM GOAL #1   Title Pt will use low tech AAC communication sheet/book to communicate personal information, preferences and family information with occasional min A over 3 sessions    Time 6    Period Weeks    Status On-going      SLP LONG TERM GOAL #2   Title Pt will use multimodal communication 4/5 anomic episodes during 15 minute simple conversation with occasional min A    Time 6    Period Weeks    Status On-going      SLP LONG TERM GOAL #3   Title Pt will use external aids (timers, alarms) to be ready to go to appointments with rare min veral  cues from sposue over 3 sessions    Time 6    Period Weeks    Status On-going            Plan - 11/16/20 1348    Clinical Impression Statement Christopher Tucker continues to present with moderate non fluent aphasia and mild to moderate cognitive impairments, including awraeness and safety awareness. He endorses this in [place], however continues to verbalize frustration when his wife does not want him to do activities she feels are unsafe. Initiated training in multimodal communcation, use of low tech AAC with family, friends and perferred places on Forensic scientist. Ongoing training of family in strategies to improve ease and efficiency of communcation over accuracy of grammare and word production. Continue skilled ST to maximize carryover of compensations for communciation and cognitive impairments for safety, reduce frustration and caregiver burden and improve QOL    Speech Therapy Frequency 2x / week    Duration 8 weeks   17 visits   Treatment/Interventions Language facilitation;SLP instruction  and feedback;Cueing hierarchy;Environmental controls;Compensatory techniques;Cognitive reorganization;Functional tasks;Compensatory strategies;Patient/family education;Multimodal communcation approach;Internal/external aids    Potential to Achieve Goals Good           Patient will benefit from skilled therapeutic intervention in order to improve the following deficits and impairments:   Aphasia  Cognitive communication deficit    Problem List Patient Active Problem List   Diagnosis Date Noted  . Glioblastoma, IDH-wildtype ([facility]) 05/14/2020  . Brain tumor ([facility]) 04/25/2020  . HYPERLIPIDEMIA 07/31/2008  . SCIATICA, ACUTE 07/31/2008    Jacie Tristan, Annye Rusk MS, CCC-SLP 11/16/2020, 1:50 PM  [facility] [address] [place], [place], [address] Phone: [phone]   Fax:  [phone]   Name: Christopher Tucker MRN: [id number] Date of Birth: 12/02/1960

## 2020-11-19 ENCOUNTER — Ambulatory Visit: Payer: 59 | Admitting: Speech Pathology

## 2020-11-19 ENCOUNTER — Ambulatory Visit: Payer: 59 | Admitting: Occupational Therapy

## 2020-11-19 ENCOUNTER — Ambulatory Visit: Payer: 59 | Admitting: Physical Therapy

## 2020-11-20 ENCOUNTER — Encounter: Payer: Self-pay | Admitting: Nurse Practitioner

## 2020-11-20 ENCOUNTER — Other Ambulatory Visit: Payer: Self-pay | Admitting: Nurse Practitioner

## 2020-11-20 ENCOUNTER — Other Ambulatory Visit: Payer: Self-pay

## 2020-11-20 DIAGNOSIS — C719 Malignant neoplasm of brain, unspecified: Secondary | ICD-10-CM

## 2020-11-20 DIAGNOSIS — Z515 Encounter for palliative care: Secondary | ICD-10-CM

## 2020-11-20 NOTE — Progress Notes (Signed)
[facility] Consult Note Telephone: [phone]  Fax: [phone]  PATIENT NAME: Christopher Tucker DOB: 06-21-1961 MRN: [id number]  PRIMARY CARE PROVIDER:   Alroy Dust, L.Marlou Sa, MD  REFERRING PROVIDER:  Alroy Dust, Carlean Jews.Marlou Sa, [place] [address] [place],  [place] [address]  RESPONSIBLE PARTY:   Self; Wife Keniel Ralston  I was asked by Dr Marlou Sa to see Christopher Tucker for Palliative care consult for complex medical decision making  1. Advance Care Planning; Full code; MOST form blank discussed, left for further discussion, will revisit next PC visit  2. Goals of Care: Goals include to maximize quality of life and symptom management. Our advance care planning conversation included a discussion about:     The value and importance of advance care planning   Exploration of personal, cultural or spiritual beliefs that might influence medical decisions   Exploration of goals of care in the event of a sudden injury or illness   Identification and preparation of a healthcare agent   Review and updating or creation of an advance directive document.  3. Palliative care encounter; Palliative care encounter; Palliative medicine team will continue to support patient, patient's family, and medical team. Visit consisted of counseling and education dealing with the complex and emotionally intense issues of symptom management and palliative care in the setting of serious and potentially life-threatening illness  4. f/u 1 month for ongoing monitoring chronic disease progression, ongoing discussions complex medical decision making  I spent 90 minutes providing this consultation,  from 9:00am to 10:30am. More than 50% of the time in this consultation was spent coordinating communication.   HISTORY OF PRESENT ILLNESS:  Christopher Tucker is a 60 y.o. year old male with multiple medical problems including Glioblastoma, craniotomy, hyperlipidemia, history of sciatica. Last  Oncology visit  2/24 / 2022 with left anaplastic astrocytoma. I called Christopher Tucker to confirm initial Palliative care visit with Christopher Tucker, covid screening negative. I visited Mr and Mrs. Delone in their home. We sat at the kitchen table and talked about purpose of Palliative care visit. We talked about family history to include Christopher Tucker having a brother and sister. We talked about Christopher Tucker being married having to adult children. 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 We talked about events that led up to the diagnosis of glioblastoma. We talked about the initial symptoms Christopher Tucker was experiencing. We talked about radiation, chemotherapy treatments. We talked about his current functional ability for which he is ambulatory, performs adl's. Christopher Tucker feeds himself though appetite is declined. Christopher Tucker endorses Christopher Tucker does sleep most of the day and through the night. Christopher Tucker concern is Christopher Tucker is sleeping more than he has in the past. We talked about the impact of the glioblastoma on Christopher Tucker body including steroids, medications, chemotherapy, everyday living. We talked about glioblastoma at length in their Oncology visits. We talked about upcoming MRI to see if chemotherapy has been beneficial. We talked about Christopher Tucker not being a candidate for surgery due to the location of the glioblastoma. We talked about the glioblastoma being a rapid growing mass per Oncology. We talked about medical goals of care. Christopher Tucker endorses he does understand that it is something he has that can not be fixed. "It is not going away". We talked about quality of life versus quantity of days. We talked about current treatments that are implemented and side effects. We talked about  sleeping a lot and that Christopher Tucker body is working hard. We talked about prior to diagnosis Christopher Tucker lifestyle was he was very athletic, a runner, Museum/gallery curator. We talked about the changes from then till  now which is only been a very short of time. Mr. And Mrs Tucker talked about recent plans that they made for Christopher Tucker funeral and preparation for future happenings. We talked about MOST form including aggressive interventions vs conservative versus comfort care. We talked about code status as Christopher Tucker currently has a full code. We talked about ongoing discussions with oncologist about quality of life versus quantity of days. We talked about Hospice benefit under Medicare program what services are provided, eligibility for Mr. and Mrs Tucker to have that knowledge of available programs when the decision is made that no further treatment is available or Christopher Tucker chooses to stop aggressive interventions. We talked about what progression could look like with symptoms. We talked about seizures so he does continue on Keppra and discuss those may need to be increased at some point if he starts having seizures, looses ability to walk, speak. We talked about currently Christopher Tucker is independent. In the future Christopher Tucker could be debilitated the glioblastoma requiring more help at home. We talked about realistic expectations when that happens. We talked about what that would look like as far as symptoms to be aware of such as fevers, personality changes, continuing to sleep more, eat less with weight loss. We talked about irritability, and frustration. We talked about grieving process which is what they are experiencing their life is different than what they thought it would ever be. We explored coping strategies. Christopher Tucker endorses they do see a counselor she more than Christopher Tucker. We talked about follow-up Palliative care visit in 4 weeks after MRI is repeated. Mr. And Mrs. Tucker in agreement, appointments scheduled. We talked about MOST form can be completed by any provider as well as next PC visit if Mr. and Mrs. Tucker are ready to revisit. Therapeutic listening and emotional support provided. Questions answered  to satisfaction. Contact information.  Palliative Care was asked to help address goals of care.   Oncology History  Glioblastoma, IDH-wildtype ([facility])  05/14/2020 Initial Diagnosis   Anaplastic astrocytoma, IDH-wildtype ([facility])   05/21/2020 - 07/02/2020 Radiation Therapy   IMRT and concurrent Temodar with Dr. Lisbeth Renshaw   07/30/2020 -  Chemotherapy   The patient had dexamethasone (DECADRON) 4 MG tablet, 1 of 1 cycle, Start date: --, End date: -- temozolomide (TEMODAR) 5 MG capsule, 200 mg/m2/day = 410 mg, Oral, Daily, 1 of 1 cycle, Start date: --, End date: -- temozolomide (TEMODAR) 20 MG capsule, 200 mg/m2/day = 400 mg, Oral, Daily, 1 of 1 cycle, Start date: --, End date: -- temozolomide (TEMODAR) 100 MG capsule, 150 mg/m2/day = 300 mg, Oral, Daily, 1 of 1 cycle, Start date: 07/30/2020, End date: 08/04/2020 temozolomide (TEMODAR) 140 MG capsule, 200 mg/m2/day = 420 mg, Oral, Daily, 1 of 1 cycle, Start date: --, End date: --  for chemotherapy treatment.    10/18/2020 -  Chemotherapy    Patient is on Treatment Plan: BRAIN ANAPLASTIC GLIOMA GRADE III TEMOZOLOMIDE POST XRT Q28D   Patient is on Antibody Plan: BRAIN GBM BEVACIZUMAB 14D X 6 CYCLES     CODE STATUS: Full code  PPS: 60% HOSPICE ELIGIBILITY/DIAGNOSIS: TBD  PAST MEDICAL HISTORY:  Past Medical History:  Diagnosis Date  . High cholesterol   . History of kidney stones  SOCIAL HX:  Social History   Tobacco Use  . Smoking status: Never Smoker  . Smokeless tobacco: Never Used  Substance Use Topics  . Alcohol use: Yes    Alcohol/week: 6.0 standard drinks    Types: 3 Glasses of wine, 3 Cans of beer per week    ALLERGIES: No Known Allergies   PERTINENT MEDICATIONS:  Outpatient Encounter Medications as of 11/20/2020  Medication Sig  . dexamethasone (DECADRON) 4 MG tablet TAKE 1 TABLET (4 MG TOTAL) BY MOUTH DAILY.  Mar[place] Kitchen ibuprofen (ADVIL) 200 MG tablet Take 400 mg by mouth 2 (two) times daily.  Marland Kitchen levETIRAcetam (KEPPRA) 500 MG  tablet Take 1 tablet (500 mg total) by mouth 2 (two) times daily.  Mar[place] Kitchen lisinopril (PRINIVIL) 10 MG tablet Take 2 tablets (20 mg total) by mouth daily.  . ondansetron (ZOFRAN) 8 MG tablet Take 1 tablet (8 mg total) by mouth 2 (two) times daily as needed (nausea and vomiting). May take 30-60 minutes prior to Temodar administration if nausea/vomiting occurs.  . sertraline (ZOLOFT) 25 MG tablet Take 1 tablet (25 mg total) by mouth daily.  . simvastatin (ZOCOR) 40 MG tablet Take 40 mg by mouth at bedtime.   No facility-administered encounter medications on file as of 11/20/2020.    PHYSICAL EXAM:   General: NAD, pleasant male Cardiovascular: regular rate and rhythm Pulmonary: clear ant fields Neurological: ambulatory  Rola Lennon Ihor Gully, NP

## 2020-11-21 ENCOUNTER — Ambulatory Visit: Payer: 59 | Admitting: Occupational Therapy

## 2020-11-21 ENCOUNTER — Ambulatory Visit: Payer: 59 | Admitting: Speech Pathology

## 2020-11-21 ENCOUNTER — Ambulatory Visit: Payer: 59

## 2020-11-25 ENCOUNTER — Other Ambulatory Visit: Payer: Self-pay | Admitting: Internal Medicine

## 2020-11-26 ENCOUNTER — Ambulatory Visit: Payer: 59 | Attending: Internal Medicine | Admitting: Physical Therapy

## 2020-11-26 ENCOUNTER — Encounter: Payer: Self-pay | Admitting: Physical Therapy

## 2020-11-26 ENCOUNTER — Ambulatory Visit: Payer: 59 | Admitting: Speech Pathology

## 2020-11-26 ENCOUNTER — Ambulatory Visit: Payer: 59 | Admitting: Occupational Therapy

## 2020-11-26 ENCOUNTER — Other Ambulatory Visit: Payer: Self-pay

## 2020-11-26 VITALS — BP 92/69 | HR 107

## 2020-11-26 DIAGNOSIS — R2689 Other abnormalities of gait and mobility: Secondary | ICD-10-CM

## 2020-11-26 DIAGNOSIS — M6281 Muscle weakness (generalized): Secondary | ICD-10-CM

## 2020-11-26 DIAGNOSIS — R278 Other lack of coordination: Secondary | ICD-10-CM | POA: Diagnosis present

## 2020-11-26 DIAGNOSIS — R2681 Unsteadiness on feet: Secondary | ICD-10-CM | POA: Diagnosis present

## 2020-11-26 NOTE — Therapy (Signed)
[facility] [address] [place], [place], [address] Phone: [phone]   Fax:  [phone]  Patient Details  Name: Christopher Tucker MRN: [id number] Date of Birth: 10/28/1960 Referring Provider:  Dr. Arlan Organ  Encounter Date: 11/26/2020  OCCUPATIONAL THERAPY DISCHARGE SUMMARY  Visits from Start of Care: 6  Current functional level related to goals / functional outcomes:  OT Short Term Goals - 11/07/20 1451      OT SHORT TERM GOAL #1   Title Pt will be independent with HEP 11/14/2020    Time 4    Period Weeks    Status Achieved    Target Date 11/14/20      OT SHORT TERM GOAL #2   Title Pt will perform environmental scanning in moderately distracted environment with 90% accuracy    Time 4    Period Weeks    Status Partially Met   88%, 11/07/20 = 100% w/ initial cues to slow down (remains inconsistent d/t fluctuations in impulsivity and frustration level)     OT SHORT TERM GOAL #3   Title Pt will complete basic ADLs with mod I .    Time 4    Period Weeks    Status Achieved   per pt report     OT SHORT TERM GOAL #4   Title Pt will verbalize understanding of energy conservation strategies    Time 4    Period Weeks    Status Achieved      OT SHORT TERM GOAL #5   Title Pt will sustain attention to a cognitive task for 15 minutes with no cues for redirection and completing with 90% accuracy or greater.    Time 4    Period Weeks    Status Deferred   pt was able to do with simple PVC task today, however unable to progress to more advanced cognitive task         Pt met no LTG's - some were deferred. Unable to assess remaining LTG's due to self d/c (anticipated d/c in 1-2 visits from last attended appointment on 11/07/20)   Remaining deficits: Same as initial eva   Education / Equipment: HEP, recommendations for simple cognitive tasks  Plan: Patient agrees to discharge.  Patient goals were partially met. Patient  is being discharged due to the patient's request.  ?????        Carey Bullocks, OTR/L 11/26/2020, 9:56 AM  [facility] [address] [place], [place], [address] Phone: [phone]   Fax:  [phone]

## 2020-11-26 NOTE — Therapy (Signed)
[facility] [address] [place], [place], [address] Phone: [phone]   Fax:  [phone]  Physical Therapy Treatment  Patient Details  Name: Christopher Tucker MRN: [id number] Date of Birth: Apr 05, 1961 Referring Provider (PT): [name], MD   Encounter Date: 11/26/2020   PT End of Session - 11/26/20 1545    Visit Number 5    Number of Visits 17    Authorization Type [name] (ends jan 31st 2022) - will have to check with gray for insurance info in Feb    PT Start Time 1447    PT Stop Time 1530    PT Time Calculation (min) 43 min    Activity Tolerance Patient tolerated treatment well    Behavior During Therapy [facility] for tasks assessed/performed           Past Medical History:  Diagnosis Date  . High cholesterol   . History of kidney stones     Past Surgical History:  Procedure Laterality Date  . APPLICATION OF CRANIAL NAVIGATION Left 04/25/2020   Procedure: APPLICATION OF CRANIAL NAVIGATION;  Surgeon: Consuella Lose, MD;  Location: [facility];  Service: Neurosurgery;  Laterality: Left;  . CRANIOTOMY Left 04/25/2020   Procedure: STEREOTACTIC LEFT TEMPORAL CRANIOTOMY FOR TUMOR;  Surgeon: Consuella Lose, MD;  Location: [facility];  Service: Neurosurgery;  Laterality: Left;  . HERNIA REPAIR Left    groin     Vitals:   11/26/20 1452 11/26/20 1518  BP: 99/68 92/69  Pulse: 98 (!) 107     Subjective Assessment - 11/26/20 1450    Subjective Lisinopril has increased to 20 mg - reports his systolic has been running a little lower in the 90s. Wife has let Dr. Renda Rolls office aware. Sleeping a whole bunch.    Patient is accompained by: Family member   wife [name]   Pertinent History left temporal anaplastic astrocytoma (s/p Stereotactic left temporal craniotomy for biopsy of L temporal lobe tumor on 04/25/20)    Limitations Walking;House hold activities;Other (comment)   running   Patient Stated Goals improve balance  and walking and become more active    Currently in Pain? No/denies                             [facility] Adult PT Treatment/Exercise - 11/26/20 1501      Exercises   Exercises Other Exercises    Other Exercises  SciFit level 3.5 with BLE and BUE for strengthening, activity tolerance for 7 minutes, cues for rpm between 60-65, HR after: 88 bpm, O2 96%               Balance Exercises - 11/26/20 0001      Balance Exercises: Standing   Rockerboard Anterior/posterior;Lateral;EO    Rockerboard Limitations weight shifting x15 reps in each direction with cues for hip/ankle strategy and for lateral weight shifting holding for a couple seconds in each direction, in A/P: head turns x10 reps, head nods x10 reps - intermittent taps to bars for balance    Tandem Gait Forward;Foam/compliant surface;Limitations    Tandem Gait Limitations on blue foam beam, down and back 3 reps cues for slowed and controlled and intermittent UE support    Sidestepping Foam/compliant support;Limitations    Sidestepping Limitations on blue foam beam down and back 4 reps, cues for slowed and controlled and picking up foot for SLS vs. Sliding it on beam    Step Over  Hurdles / Cones stepping over 3 hurdles (2 smaller, one larger) on red compliant mat, down and back 4 reps, cues for slowed and controlled for SLS and for incr hip/knee flexion    Other Standing Exercises on red mat: side stepping and alternating toe taps to cones down and back x3 reps with cues to gently tap cone, intermittent min guard/min A for balance             PT Education - 11/26/20 1547    Education Details making Dr. [name] aware of lowered systolic BP    Person(s) Educated Patient;Spouse    Methods Explanation    Comprehension Verbalized understanding            PT Short Term Goals - 11/14/20 1722      PT SHORT TERM GOAL #1   Title Pt will be independent with initial HEP in order to build upon functional gains made in  therapy. ALL STGS DUE 11/15/20    Baseline reviewed previous exercises and added additional ones on 11/14/20    Time 4    Period Weeks    Status Partially Met    Target Date 11/15/20      PT SHORT TERM GOAL #2   Title Pt will trial R foot up brace for improved foot clearance with gait when more fatigued.    Time 4    Period Weeks    Status Achieved      PT SHORT TERM GOAL #3   Title Pt will improve FGA score to at least 23/30 in order to demo decr fall risk.    Baseline 21/30    Time 4    Period Weeks    Status New      PT SHORT TERM GOAL #4   Title Pt will ambulate at least 200' over outdoor paved surfaces with supervision with no AD vs. LRAD in order to demo improved community mobility.    Time 4    Period Weeks    Status New      PT SHORT TERM GOAL #5   Title Pt and pt's wife will verbalize understanding of fall prevention in the home.    Time 4    Period Weeks    Status New             PT Long Term Goals - 10/18/20 0904      PT LONG TERM GOAL #1   Title Pt will be independent with final HEP in order to build upon functional gains made in therapy. ALL LTGS DUE 12/13/20    Time 8    Period Weeks    Status New    Target Date 12/13/20      PT LONG TERM GOAL #2   Title Pt will improve FGA score to at least 25/30 in order to demo decr fall risk.    Baseline 21/30    Time 8    Period Weeks    Status New      PT LONG TERM GOAL #3   Title Pt will perform at least 3 steps with LRAD vs. step to pattern with no handrail with supevision in order to safely enter/exit house.    Time 8    Period Weeks    Status New      PT LONG TERM GOAL #4   Title Pt will ambulate at least 500' over outdoor paved surfaces with supervision with no AD vs. LRAD in order to demo improved community mobility  Time 8    Period Weeks    Status New      PT LONG TERM GOAL #5   Title Pt will maintain 5x sit <> stand time of 11.91 seconds or less in order to demo improved functional BLE  strength and decr fall risk.    Baseline 11.91 seconds    Time 8    Period Weeks    Status New                 Plan - 11/26/20 1626    Clinical Impression Statement Pt's systolic BP lower today throughout session (see vitals), but pt asympatomatic throughout and water/rest breaks taken throughout. Pt's spouse has made Dr. [name] aware. Today's skilled session focused on strengthening and balance strategies on compliant surfaces. Pt tolerated session well, will continue to progress towards LTG.s    Personal Factors and Comorbidities Comorbidity 1    Comorbidities Glioblastoma Grade IV    Examination-Activity Limitations Stand;Transfers;Stairs;Locomotion Level    Examination-Participation Restrictions Community Activity;Occupation;Driving   works as an Armed forces operational officer    Rehab Potential Good    PT Frequency 2x / week    PT Duration 8 weeks    PT Treatment/Interventions ADLs/Self Care Home Management;DME Instruction;Gait training;Stair training;Therapeutic activities;Functional mobility training;Therapeutic exercise;Balance training;Neuromuscular re-education;Patient/family education;Vestibular;Energy conservation;Orthotic Fit/Training    PT Next Visit Plan how is HEP? functional strengthening, balance for vestibular input and eyes closed. SLS activities for RLE.    PT Home Exercise Plan [id number]    Consulted and Agree with Plan of Care Patient           Patient will benefit from skilled therapeutic intervention in order to improve the following deficits and impairments:  Abnormal gait,Decreased balance,Decreased activity tolerance,Difficulty walking,Decreased strength  Visit Diagnosis: Muscle weakness (generalized)  Other abnormalities of gait and mobility  Unsteadiness on feet     Problem List Patient Active Problem List   Diagnosis Date Noted  . 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 Brain tumor ([facility])  04/25/2020  . HYPERLIPIDEMIA 07/31/2008  . SCIATICA, ACUTE 07/31/2008    [name], PT, DPT  11/26/2020, 4:28 PM  [facility] [address] [place], [place], [address] Phone: [phone]   Fax:  [phone]  Name: Demaryius Imran MRN: [id number] Date of Birth: April 17, 1961

## 2020-11-27 NOTE — Telephone Encounter (Signed)
Rx refill requests 

## 2020-11-30 ENCOUNTER — Inpatient Hospital Stay (HOSPITAL_BASED_OUTPATIENT_CLINIC_OR_DEPARTMENT_OTHER): Payer: 59 | Admitting: Internal Medicine

## 2020-11-30 ENCOUNTER — Encounter: Payer: Self-pay | Admitting: Internal Medicine

## 2020-11-30 ENCOUNTER — Ambulatory Visit: Payer: 59 | Admitting: Physical Therapy

## 2020-11-30 ENCOUNTER — Inpatient Hospital Stay: Payer: 59

## 2020-11-30 ENCOUNTER — Encounter: Payer: 59 | Admitting: Occupational Therapy

## 2020-11-30 ENCOUNTER — Other Ambulatory Visit: Payer: Self-pay

## 2020-11-30 ENCOUNTER — Inpatient Hospital Stay: Payer: 59 | Attending: Neurosurgery

## 2020-11-30 VITALS — BP 112/85 | HR 98 | Temp 98.0°F | Resp 16 | Ht 71.0 in | Wt 198.0 lb

## 2020-11-30 VITALS — BP 131/91

## 2020-11-30 DIAGNOSIS — C719 Malignant neoplasm of brain, unspecified: Secondary | ICD-10-CM | POA: Diagnosis not present

## 2020-11-30 DIAGNOSIS — Z87442 Personal history of urinary calculi: Secondary | ICD-10-CM | POA: Diagnosis not present

## 2020-11-30 DIAGNOSIS — Z923 Personal history of irradiation: Secondary | ICD-10-CM | POA: Insufficient documentation

## 2020-11-30 DIAGNOSIS — Z7952 Long term (current) use of systemic steroids: Secondary | ICD-10-CM | POA: Diagnosis not present

## 2020-11-30 DIAGNOSIS — Z5112 Encounter for antineoplastic immunotherapy: Secondary | ICD-10-CM | POA: Diagnosis not present

## 2020-11-30 LAB — CBC WITH DIFFERENTIAL/PLATELET
Abs Immature Granulocytes: 0.02 10*3/uL (ref 0.00–0.07)
Basophils Absolute: 0 10*3/uL (ref 0.0–0.1)
Basophils Relative: 1 %
Eosinophils Absolute: 0 10*3/uL (ref 0.0–0.5)
Eosinophils Relative: 1 %
HCT: 35 % — ABNORMAL LOW (ref 39.0–52.0)
Hemoglobin: 12.2 g/dL — ABNORMAL LOW (ref 13.0–17.0)
Immature Granulocytes: 1 %
Lymphocytes Relative: 14 %
Lymphs Abs: 0.4 10*3/uL — ABNORMAL LOW (ref 0.7–4.0)
MCH: 32.5 pg (ref 26.0–34.0)
MCHC: 34.9 g/dL (ref 30.0–36.0)
MCV: 93.3 fL (ref 80.0–100.0)
Monocytes Absolute: 0.3 10*3/uL (ref 0.1–1.0)
Monocytes Relative: 10 %
Neutro Abs: 2.4 10*3/uL (ref 1.7–7.7)
Neutrophils Relative %: 73 %
Platelets: 208 10*3/uL (ref 150–400)
RBC: 3.75 MIL/uL — ABNORMAL LOW (ref 4.22–5.81)
RDW: 14.6 % (ref 11.5–15.5)
WBC: 3.3 10*3/uL — ABNORMAL LOW (ref 4.0–10.5)
nRBC: 0 % (ref 0.0–0.2)

## 2020-11-30 LAB — COMPREHENSIVE METABOLIC PANEL
ALT: 37 U/L (ref 0–44)
AST: 28 U/L (ref 15–41)
Albumin: 3.4 g/dL — ABNORMAL LOW (ref 3.5–5.0)
Alkaline Phosphatase: 69 U/L (ref 38–126)
Anion gap: 8 (ref 5–15)
BUN: 11 mg/dL (ref 6–20)
CO2: 23 mmol/L (ref 22–32)
Calcium: 9.2 mg/dL (ref 8.9–10.3)
Chloride: 111 mmol/L (ref 98–111)
Creatinine, Ser: 0.88 mg/dL (ref 0.61–1.24)
GFR, Estimated: >60 mL/min (ref >60)
Glucose, Bld: 161 mg/dL — ABNORMAL HIGH (ref 70–99)
Potassium: 3.6 mmol/L (ref 3.5–5.1)
Sodium: 142 mmol/L (ref 135–145)
Total Bilirubin: 0.9 mg/dL (ref 0.3–1.2)
Total Protein: 6.8 g/dL (ref 6.5–8.1)

## 2020-11-30 LAB — TOTAL PROTEIN, URINE DIPSTICK: Protein, ur: NEGATIVE mg/dL

## 2020-11-30 MED ORDER — HYDROCORTISONE 10 MG PO TABS: 10.0000 mg | ORAL_TABLET | Freq: Two times a day (BID) | ORAL | 1 refills | Status: DC

## 2020-11-30 MED ORDER — METHYLPHENIDATE HCL 5 MG PO TABS: 5.0000 mg | ORAL_TABLET | Freq: Two times a day (BID) | ORAL | 0 refills | Status: AC

## 2020-11-30 MED ORDER — SODIUM CHLORIDE 0.9 % IV SOLN
Freq: Once | INTRAVENOUS | Status: AC
  Filled 2020-11-30: qty 250

## 2020-11-30 MED ORDER — DRONABINOL 2.5 MG PO CAPS: 2.5000 mg | ORAL_CAPSULE | Freq: Two times a day (BID) | ORAL | 2 refills | Status: AC

## 2020-11-30 MED ORDER — SODIUM CHLORIDE 0.9 % IV SOLN
10.0000 mg/kg | Freq: Once | INTRAVENOUS | Status: AC
  Administered 2020-11-30: 900 mg via INTRAVENOUS
  Filled 2020-11-30: qty 32

## 2020-11-30 NOTE — Patient Instructions (Signed)
[facility] Discharge Instructions for Patients Receiving Chemotherapy  Today you received the following chemotherapy agents: bevacizumab.  To help prevent nausea and vomiting after your treatment, we encourage you to take your nausea medication as directed.   If you develop nausea and vomiting that is not controlled by your nausea medication, call the clinic.   BELOW ARE SYMPTOMS THAT SHOULD BE REPORTED IMMEDIATELY:  *FEVER GREATER THAN 100.5 F  *CHILLS WITH OR WITHOUT FEVER  NAUSEA AND VOMITING THAT IS NOT CONTROLLED WITH YOUR NAUSEA MEDICATION  *UNUSUAL SHORTNESS OF BREATH  *UNUSUAL BRUISING OR BLEEDING  TENDERNESS IN MOUTH AND THROAT WITH OR WITHOUT PRESENCE OF ULCERS  *URINARY PROBLEMS  *BOWEL PROBLEMS  UNUSUAL RASH Items with * indicate a potential emergency and should be followed up as soon as possible.  Feel free to call the clinic should you have any questions or concerns. The clinic phone number is [phone].  Please show the [facility] at check-in to the Emergency Department and triage nurse.  Bevacizumab injection What is this medicine? BEVACIZUMAB (be va SIZ yoo mab) is a monoclonal antibody. It is used to treat many types of cancer. This medicine may be used for other purposes; ask your health care provider or pharmacist if you have questions. COMMON BRAND NAME(S): Avastin, MVASI, Zirabev What should I tell my health care provider before I take this medicine? They need to know if you have any of these conditions:  diabetes  heart disease  high blood pressure  history of coughing up blood  prior anthracycline chemotherapy (e.g., doxorubicin, daunorubicin, epirubicin)  recent or ongoing radiation therapy  recent or planning to have surgery  stroke  an unusual or allergic reaction to bevacizumab, hamster proteins, mouse proteins, other medicines, foods, dyes, or preservatives  pregnant or trying to get  pregnant  breast-feeding How should I use this medicine? This medicine is for infusion into a vein. It is given by a health care professional in a hospital or clinic setting. Talk to your pediatrician regarding the use of this medicine in children. Special care may be needed. Overdosage: If you think you have taken too much of this medicine contact a poison control center or emergency room at once. NOTE: This medicine is only for you. Do not share this medicine with others. What if I miss a dose? It is important not to miss your dose. Call your doctor or health care professional if you are unable to keep an appointment. What may interact with this medicine? Interactions are not expected. This list may not describe all possible interactions. Give your health care provider a list of all the medicines, herbs, non-prescription drugs, or dietary supplements you use. Also tell them if you smoke, drink alcohol, or use illegal drugs. Some items may interact with your medicine. What should I watch for while using this medicine? Your condition will be monitored carefully while you are receiving this medicine. You will need important blood work and urine testing done while you are taking this medicine. This medicine may increase your risk to bruise or bleed. Call your doctor or health care professional if you notice any unusual bleeding. Before having surgery, talk to your health care provider to make sure it is ok. This drug can increase the risk of poor healing of your surgical site or wound. You will need to stop this drug for 28 days before surgery. After surgery, wait at least 28 days before restarting this drug. Make sure the surgical  site or wound is healed enough before restarting this drug. Talk to your health care provider if questions. Do not become pregnant while taking this medicine or for 6 months after stopping it. Women should inform their doctor if they wish to become pregnant or think they  might be pregnant. There is a potential for serious side effects to an unborn child. Talk to your health care professional or pharmacist for more information. Do not breast-feed an infant while taking this medicine and for 6 months after the last dose. This medicine has caused ovarian failure in some women. This medicine may interfere with the ability to have a child. You should talk to your doctor or health care professional if you are concerned about your fertility. What side effects may I notice from receiving this medicine? Side effects that you should report to your doctor or health care professional as soon as possible:  allergic reactions like skin rash, itching or hives, swelling of the face, lips, or tongue  chest pain or chest tightness  chills  coughing up blood  high fever  seizures  severe constipation  signs and symptoms of bleeding such as bloody or black, tarry stools; red or dark-brown urine; spitting up blood or brown material that looks like coffee grounds; red spots on the skin; unusual bruising or bleeding from the eye, gums, or nose  signs and symptoms of a blood clot such as breathing problems; chest pain; severe, sudden headache; pain, swelling, warmth in the leg  signs and symptoms of a stroke like changes in vision; confusion; trouble speaking or understanding; severe headaches; sudden numbness or weakness of the face, arm or leg; trouble walking; dizziness; loss of balance or coordination  stomach pain  sweating  swelling of legs or ankles  vomiting  weight gain Side effects that usually do not require medical attention (report to your doctor or health care professional if they continue or are bothersome):  back pain  changes in taste  decreased appetite  dry skin  nausea  tiredness This list may not describe all possible side effects. Call your doctor for medical advice about side effects. You may report side effects to FDA at  1-800-FDA-1088. Where should I keep my medicine? This drug is given in a hospital or clinic and will not be stored at home. NOTE: This sheet is a summary. It may not cover all possible information. If you have questions about this medicine, talk to your doctor, pharmacist, or health care provider.  2021 Elsevier/Gold Standard (2019-07-06 10:50:46)

## 2020-11-30 NOTE — Progress Notes (Signed)
[facility] [place] [place], [place] [address] [phone]   Interval Evaluation  Date of Service: 11/30/20 Patient Name: Christopher Tucker Patient MRN: [id number] Patient DOB: [birth year] Provider: [name], MD  Identifying Statement:  Christopher Tucker is a 60 y.o. 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 Lisbeth Renshaw   07/30/2020 -  Chemotherapy   The patient had dexamethasone (DECADRON) 4 MG tablet, 1 of 1 cycle, Start date: --, End date: -- temozolomide (TEMODAR) 5 MG capsule, 200 mg/m2/day = 410 mg, Oral, Daily, 1 of 1 cycle, Start date: --, End date: -- temozolomide (TEMODAR) 20 MG capsule, 200 mg/m2/day = 400 mg, Oral, Daily, 1 of 1 cycle, Start date: --, End date: -- temozolomide (TEMODAR) 100 MG capsule, 150 mg/m2/day = 300 mg, Oral, Daily, 1 of 1 cycle, Start date: 07/30/2020, End date: 08/04/2020 temozolomide (TEMODAR) 140 MG capsule, 200 mg/m2/day = 420 mg, Oral, Daily, 1 of 1 cycle, Start date: --, End date: --  for chemotherapy treatment.    10/18/2020 -  Chemotherapy    Patient is on Treatment Plan: BRAIN ANAPLASTIC GLIOMA GRADE III TEMOZOLOMIDE POST XRT Q28D   Patient is on Antibody Plan: BRAIN GBM BEVACIZUMAB 14D X 6 CYCLES       Biomarkers:  MGMT Unknown.  IDH 1/2 Wild type.  EGFR Unknown  TERT "Mutated   Interval History:  Christopher Tucker presents today for avastin infusion. Doesn't describe changes in speech this week.  His right side remains weak, and he requires assistance with some  ADLs such as dressing and bathing.  Gait remains independent.  Continues to describe ongoing swelling in his face, neck and upper back, although he has fully weaned decadron.  Sleep volume has been very high,  18 hours per day per his wife.  Overall he feels more tired and sluggish since discontinuing dex. Denies seizures or headaches.      Decadron: 10/05/20: 46m 10/19/20: 455m2/24/22: 58m24m/04/22: -  H+P (05/10/20) Patient presented to medical attention in early August with several months history of sensory episodes.  They are described as "numbness marching down right arm also involving the leg, lasting for several minutes".  There is no post event weakness or confusion.  No known history of seizures.  In recent weeks he has also experienced some difficulty with expressive language, finding particular words and maintaining high fluency.  Since starting Keppra post-operatively he has not had any recurrence of these sensory events.  He has weaned off dexamethasone.  We are still awaiting finalized pathology results.    Medications: Current Outpatient Medications on File Prior to Visit  Medication Sig Dispense Refill  . ibuprofen (ADVIL) 200 MG tablet Take 400 mg by mouth 2 (two) times daily.    . lMarland KitchenvETIRAcetam (KEPPRA) 500 MG tablet TAKE 1 TABLET BY MOUTH TWICE A DAY 60 tablet 1  . lisinopril (PRINIVIL) 10 MG tablet Take 2 tablets (20 mg total) by mouth daily. 60 tablet 3  . ondansetron (ZOFRAN) 8 MG tablet Take 1 tablet (8 mg total) by mouth 2 (two) times daily as needed (nausea and vomiting). May take 30-60 minutes prior to Temodar administration if nausea/vomiting occurs. 30 tablet 1  . sertraline (ZOLOFT) 25 MG tablet TAKE 1 TABLET (25 MG TOTAL) BY  MOUTH DAILY. 30 tablet 3  . simvastatin (ZOCOR) 40 MG tablet Take 40 mg by mouth at bedtime.    Marland Kitchen dexamethasone (DECADRON) 4 MG tablet TAKE 1 TABLET (4 MG TOTAL) BY MOUTH DAILY. (Patient not taking: Reported on 11/30/2020) 30 tablet 0   No current facility-administered medications on file prior to visit.    Allergies: No Known Allergies Past Medical History:  Past Medical History:  Diagnosis Date  . High cholesterol   . History of kidney stones     Past Surgical History:  Past Surgical History:  Procedure Laterality Date  . APPLICATION OF CRANIAL NAVIGATION Left 04/25/2020   Procedure: APPLICATION OF CRANIAL NAVIGATION;  Surgeon: Consuella Lose, MD;  Location: [facility];  Service: Neurosurgery;  Laterality: Left;  . CRANIOTOMY Left 04/25/2020   Procedure: STEREOTACTIC LEFT TEMPORAL CRANIOTOMY FOR TUMOR;  Surgeon: Consuella Lose, MD;  Location: [facility];  Service: Neurosurgery;  Laterality: Left;  . HERNIA REPAIR Left    groin    Social History:  Social History   Socioeconomic History  . Marital status: Married    Spouse name: Not on file  . Number of children: Not on file  . Years of education: Not on file  . Highest education level: Not on file  Occupational History  . Not on file  Tobacco Use  . Smoking status: Never Smoker  . Smokeless tobacco: Never Used  Substance and Sexual Activity  . Alcohol use: Yes    Alcohol/week: 6.0 standard drinks    Types: 3 Glasses of wine, 3 Cans of beer per week  . Drug use: Not Currently  . Sexual activity: Not on file  Other Topics Concern  . Not on file  Social History Narrative  . Not on file   Social Determinants of Health   Financial Resource Strain: Not on file  Food Insecurity: Not on file  Transportation Needs: Not on file  Physical Activity: Not on file  Stress: Not on file  Social Connections: Not on file  Intimate Partner Violence: Not on file   Family History: No family history on file.  Review of Systems: Constitutional: Doesn't report fevers, chills or abnormal weight loss Eyes: Doesn't report blurriness of vision Ears, nose, mouth, throat, and face: Doesn't report sore throat Respiratory: Doesn't report cough, dyspnea or wheezes Cardiovascular: Doesn't report palpitation, chest discomfort  Gastrointestinal:  Doesn't report nausea, constipation, diarrhea GU: Doesn't report incontinence Skin: Doesn't report skin rashes Neurological: Per  HPI Musculoskeletal: Doesn't report joint pain Behavioral/Psych: Doesn't report anxiety  Physical Exam: Vitals:   11/30/20 0800  BP: 112/85  Pulse: 98  Resp: 16  Temp: 98 F (36.7 C)  SpO2: 99%   KPS: 70. General: cushingoid appearance Head: Normal EENT: No conjunctival injection or scleral icterus.  Lungs: Resp effort normal Cardiac: Regular rate Abdomen: Non-distended abdomen Skin: No rashes cyanosis or petechiae. Extremities: No clubbing or edema  Neurologic Exam: Mental Status: Awake, alert, attentive to examiner. Oriented to self and environment. Language c/w transcortical expressive dyshpasia Cranial Nerves: Visual acuity is grossly normal. Right field hemianopia. Extra-ocular movements intact. No ptosis. Face is symmetric Motor: Tone and bulk are normal. 4+/5 in right arm and leg. Reflexes are symmetric, no pathologic reflexes present.  Sensory: Intact to light touch Gait: Dystaxic, hemiparetic  Labs: I have reviewed the data as listed    Component Value Date/Time   NA 139 11/15/2020 1231   K 4.2 11/15/2020 1231   CL 107 11/15/2020 1231   CO2 22  11/15/2020 1231   GLUCOSE 107 (H) 11/15/2020 1231   BUN 14 11/15/2020 1231   CREATININE 0.82 11/15/2020 1231   CREATININE 1.07 09/17/2020 0823   CALCIUM 8.7 (L) 11/15/2020 1231   PROT 6.8 11/15/2020 1231   ALBUMIN 3.3 (L) 11/15/2020 1231   AST 18 11/15/2020 1231   AST 24 09/17/2020 0823   ALT 48 (H) 11/15/2020 1231   ALT 50 (H) 09/17/2020 0823   ALKPHOS 64 11/15/2020 1231   BILITOT 1.0 11/15/2020 1231   BILITOT 1.2 09/17/2020 0823   GFRNONAA >60 11/15/2020 1231   GFRNONAA >60 09/17/2020 0823   GFRAA >60 06/14/2020 0828   Lab Results  Component Value Date   WBC 3.3 (L) 11/30/2020   NEUTROABS 2.4 11/30/2020   HGB 12.2 (L) 11/30/2020   HCT 35.0 (L) 11/30/2020   MCV 93.3 11/30/2020   PLT 208 11/30/2020    Assessment/Plan Glioblastoma, 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   Labs are within normal limits for Avastin today.    We recommended continuing treatment with Avastin 2m/kg IV q2 weeks.    Avastin should be held for the following:  ANC less than 1500  Platelets less than 50,000  LFT or creatinine greater than 2x ULN  If clinical concerns/contraindications develop  For lethargy, increased sleep volume, will prescribe trial of ritalin 51mBID.  For poor appetite, provided script for Marinol 2.54m28mID.  Because of improved feeling while on decadron, but poor tolerance (cushingoid features, some myopathic complaints), will start Hydrocortisone 88m33mD.  Should continue Lisinopril 20mg62mly for HTN.   May discontinue decadron if tolerated.  Will continue zoloft 254mg 78my.  We ask that Kepler FDarshan Solankin to clinic in 2 weeks for next avastin treatment following MRI brain for evaluation.  All questions were answered. The patient knows to call the clinic with any problems, questions or concerns. No barriers to learning were detected.  I have spent a total of 30 minutes of face-to-face and non-face-to-face time, excluding clinical staff time, preparing to see patient, ordering tests and/or medications, counseling the patient, and independently interpreting results and communicating results to the patient/family/caregiver    ZacharVentura Sellersedical Director of Neuro-Oncology [facility]aker/22 8:56 AM

## 2020-12-03 ENCOUNTER — Other Ambulatory Visit: Payer: Self-pay

## 2020-12-03 ENCOUNTER — Encounter: Payer: 59 | Admitting: Speech Pathology

## 2020-12-03 ENCOUNTER — Encounter: Payer: 59 | Admitting: Occupational Therapy

## 2020-12-03 ENCOUNTER — Ambulatory Visit: Payer: 59

## 2020-12-03 VITALS — BP 138/82

## 2020-12-03 DIAGNOSIS — M6281 Muscle weakness (generalized): Secondary | ICD-10-CM

## 2020-12-03 DIAGNOSIS — R2681 Unsteadiness on feet: Secondary | ICD-10-CM

## 2020-12-03 NOTE — Therapy (Signed)
[facility] [address] [place], [place], [address] Phone: [phone]   Fax:  [phone]  Physical Therapy Treatment  Patient Details  Name: Christopher Tucker MRN: [id number] Date of Birth: 1960-11-20 Referring Provider (PT): [name], MD   Encounter Date: 12/03/2020    Past Medical History:  Diagnosis Date  . High cholesterol   . History of kidney stones     Past Surgical History:  Procedure Laterality Date  . APPLICATION OF CRANIAL NAVIGATION Left 04/25/2020   Procedure: APPLICATION OF CRANIAL NAVIGATION;  Surgeon: Consuella Lose, MD;  Location: [facility];  Service: Neurosurgery;  Laterality: Left;  . CRANIOTOMY Left 04/25/2020   Procedure: STEREOTACTIC LEFT TEMPORAL CRANIOTOMY FOR TUMOR;  Surgeon: Consuella Lose, MD;  Location: [facility];  Service: Neurosurgery;  Laterality: Left;  . HERNIA REPAIR Left    groin     Vitals:   12/03/20 1411  BP: 138/82     Subjective Assessment - 12/03/20 1411    Subjective No changes to report, no pain or med changes    Patient is accompained by: Family member    Pertinent History left temporal anaplastic astrocytoma (s/p Stereotactic left temporal craniotomy for biopsy of L temporal lobe tumor on 04/25/20)    Limitations Walking;House hold activities;Other (comment)    Patient Stated Goals improve balance and walking and become more active    Currently in Pain? No/denies                             [facility] Adult PT Treatment/Exercise - 12/03/20 0001      Exercises   Other Exercises  Scifit L 3.5 x 57mn               Balance Exercises - 12/03/20 0001      Balance Exercises: Standing   SLS Eyes open;Solid surface;1 rep;30 secs;Limitations    SLS Limitations soccer ball manipulation 30s holds ea. LE needing UE support    Rockerboard Anterior/posterior;Lateral;Head turns;EO;EC;30 seconds;Intermittent UE support;Limitations    Rockerboard  Limitations 30s hold EC/EO in M/L and A/P positions, 10 head turns/nods with EO/EC in ea. position, 30s hold in tandem position    Tandem Gait Forward;Retro;Foam/compliant surface;5 reps    Tandem Gait Limitations across blue foam beam 5 trips in bars    Sidestepping Foam/compliant support;Upper extremity support;5 reps;Limitations    Sidestepping Limitations across blue foam beam 5 trips in // bars               PT Short Term Goals - 11/14/20 1722      PT SHORT TERM GOAL #1   Title Pt will be independent with initial HEP in order to build upon functional gains made in therapy. ALL STGS DUE 11/15/20    Baseline reviewed previous exercises and added additional ones on 11/14/20    Time 4    Period Weeks    Status Partially Met    Target Date 11/15/20      PT SHORT TERM GOAL #2   Title Pt will trial R foot up brace for improved foot clearance with gait when more fatigued.    Time 4    Period Weeks    Status Achieved      PT SHORT TERM GOAL #3   Title Pt will improve FGA score to at least 23/30 in order to demo decr fall risk.    Baseline 21/30    Time 4  Period Weeks    Status New      PT SHORT TERM GOAL #4   Title Pt will ambulate at least 200' over outdoor paved surfaces with supervision with no AD vs. LRAD in order to demo improved community mobility.    Time 4    Period Weeks    Status New      PT SHORT TERM GOAL #5   Title Pt and pt's wife will verbalize understanding of fall prevention in the home.    Time 4    Period Weeks    Status New             PT Long Term Goals - 10/18/20 0904      PT LONG TERM GOAL #1   Title Pt will be independent with final HEP in order to build upon functional gains made in therapy. ALL LTGS DUE 12/13/20    Time 8    Period Weeks    Status New    Target Date 12/13/20      PT LONG TERM GOAL #2   Title Pt will improve FGA score to at least 25/30 in order to demo decr fall risk.    Baseline 21/30    Time 8    Period Weeks     Status New      PT LONG TERM GOAL #3   Title Pt will perform at least 3 steps with LRAD vs. step to pattern with no handrail with supevision in order to safely enter/exit house.    Time 8    Period Weeks    Status New      PT LONG TERM GOAL #4   Title Pt will ambulate at least 500' over outdoor paved surfaces with supervision with no AD vs. LRAD in order to demo improved community mobility    Time 8    Period Weeks    Status New      PT LONG TERM GOAL #5   Title Pt will maintain 5x sit <> stand time of 11.91 seconds or less in order to demo improved functional BLE strength and decr fall risk.    Baseline 11.91 seconds    Time 8    Period Weeks    Status New                 Plan - 12/03/20 1415    Clinical Impression Statement Focus of todays skilled session was monitoring BP levels, strength and endurance training, high level balance tasks with EO/EC.  Fatigued at end of session, perfrmance level varies day to day, balance challenges more prominent with eyes closed and with SLS tasks    Personal Factors and Comorbidities Comorbidity 1    Comorbidities Glioblastoma Grade IV    Examination-Activity Limitations Stand;Transfers;Stairs;Locomotion Level    Examination-Participation Restrictions Community Activity;Occupation;Driving    Stability/Clinical Decision Making Evolving/Moderate complexity    Rehab Potential Good    PT Frequency 2x / week    PT Duration 8 weeks    PT Next Visit Plan HEP review, discuss limitations outside of clinic, continue high level balance tasks  with visio removed environment, check STGs           Patient will benefit from skilled therapeutic intervention in order to improve the following deficits and impairments:  Abnormal gait,Decreased balance,Decreased activity tolerance,Difficulty walking,Decreased strength  Visit Diagnosis: Unsteadiness on feet  Muscle weakness (generalized)     Problem List Patient Active Problem List    Diagnosis Date Noted  .  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 Brain tumor ([facility]) 04/25/2020  . HYPERLIPIDEMIA 07/31/2008  . SCIATICA, ACUTE 07/31/2008    Lanice Shirts PT 12/03/2020, 4:34 PM  [facility] [address] [place], [place], [address] Phone: [phone]   Fax:  [phone]  Name: Christopher Tucker MRN: [id number] Date of Birth: 1961-01-10

## 2020-12-04 ENCOUNTER — Encounter: Payer: Self-pay | Admitting: Internal Medicine

## 2020-12-04 ENCOUNTER — Other Ambulatory Visit: Payer: Self-pay | Admitting: Radiation Therapy

## 2020-12-05 ENCOUNTER — Ambulatory Visit: Payer: 59 | Admitting: Physical Therapy

## 2020-12-05 ENCOUNTER — Encounter: Payer: 59 | Admitting: Occupational Therapy

## 2020-12-05 ENCOUNTER — Encounter: Payer: 59 | Admitting: Speech Pathology

## 2020-12-05 NOTE — Telephone Encounter (Signed)
Returned patient's call regarding scheduling approved MRI scan. Patient provided with number for scheduling.

## 2020-12-06 ENCOUNTER — Ambulatory Visit (HOSPITAL_COMMUNITY): Payer: 59

## 2020-12-06 ENCOUNTER — Other Ambulatory Visit: Payer: Self-pay

## 2020-12-06 ENCOUNTER — Ambulatory Visit (HOSPITAL_COMMUNITY)
Admission: RE | Admit: 2020-12-06 | Discharge: 2020-12-06 | Disposition: A | Discharge status: Home / Self Care | Payer: 59 | Source: Ambulatory Visit | Attending: Internal Medicine | Admitting: Internal Medicine

## 2020-12-06 DIAGNOSIS — C719 Malignant neoplasm of brain, unspecified: Secondary | ICD-10-CM | POA: Insufficient documentation

## 2020-12-06 IMAGING — MR MR HEAD WO/W CM
16 series · 48 of 48 positions shown · IV contrast (multihance)
Comparison: [DATE]

CLINICAL DATA: Follow-up glioblastoma

EXAM:
MRI HEAD WITHOUT AND WITH CONTRAST
TECHNIQUE: Multiplanar, multiecho pulse sequences of the brain and surrounding
structures were obtained without and with intravenous contrast.
CONTRAST:  10mL MULTIHANCE GADOBENATE DIMEGLUMINE 529 MG/ML IV SOLN

[Series 5: DWI · axial · 3.0mm · 1.36mm/px · z∈[-46,+94]mm · 5 of 96 slices shown (1 of 2)]
[im 1/96]
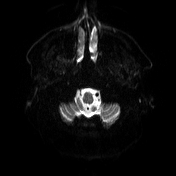
[im 24/96]
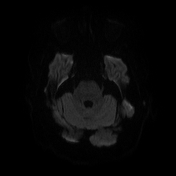
[im 48/96]
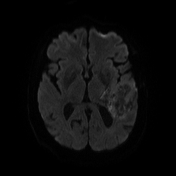
[im 72/96]
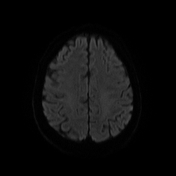
[im 96/96]
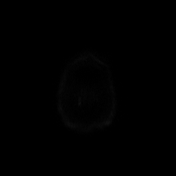

[Series 6: DWI · axial · 3.0mm · 1.36mm/px · z∈[-46,+94]mm · 3 of 48 slices shown (2 of 2)]
[im 1/48]
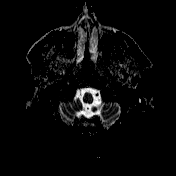
[im 24/48]
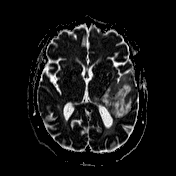
[im 48/48]
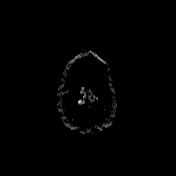

[Series 7: T1 · sagittal · 5.0mm · 0.75mm/px · 1 of 24 slices shown (1 of 4)]
[im 1/24]
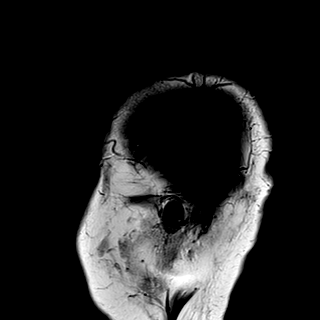

[Series 8: T2 · axial · 5.0mm · 0.62mm/px · 1 of 26 slices shown]
[im 1/26]
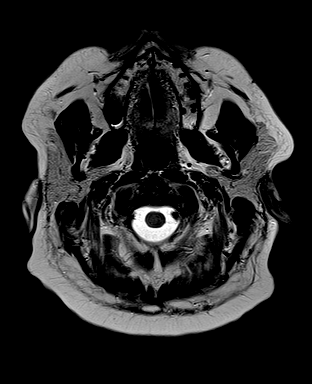

[Series 9: mip_images(sw) · axial · 24.0mm · 0.75mm/px · z∈[-53,+103]mm · 3 of 53 slices shown]
[im 1/53]
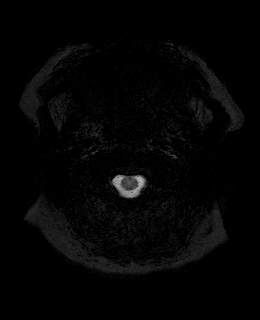
[im 27/53]
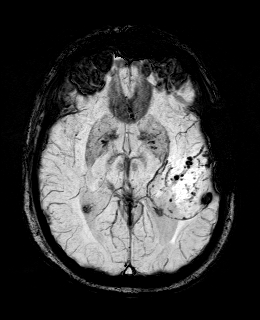
[im 53/53]
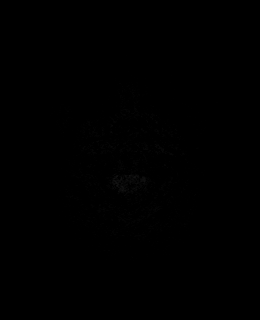

[Series 10: swi_images · axial · 3.0mm · 0.75mm/px · z∈[-64,+113]mm · 3 of 60 slices shown]
[im 1/60]
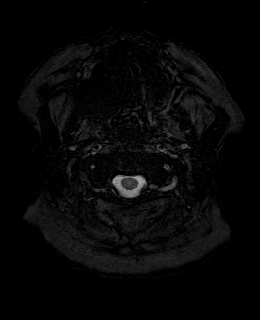
[im 30/60]
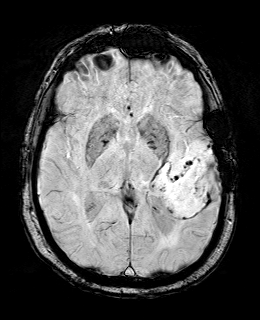
[im 60/60]
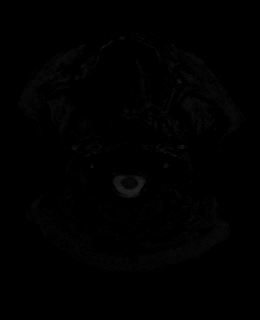

[Series 11: FLAIR · axial · 3.0mm · 0.75mm/px · z∈[-52,+101]mm · 3 of 52 slices shown]
[im 1/52]
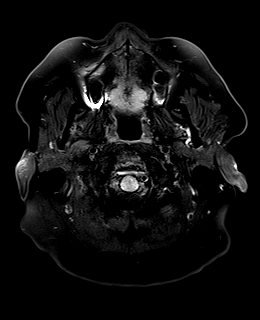
[im 26/52]
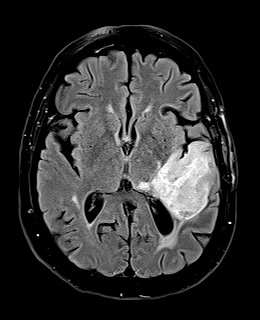
[im 52/52]
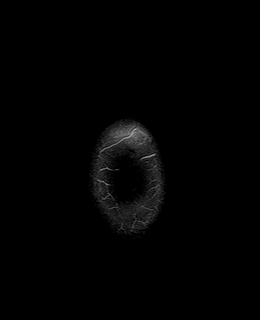

[Series 12: T1 · axial · 1.0mm · 0.94mm/px · z∈[-47,+96]mm · 8 of 144 slices shown (2 of 4)]
[im 1/144]
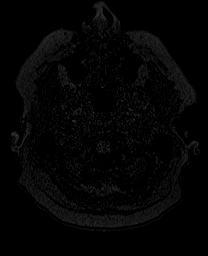
[im 21/144]
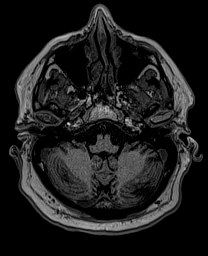
[im 41/144]
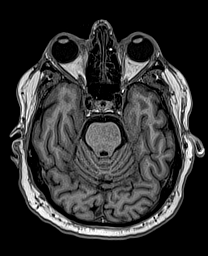
[im 62/144]
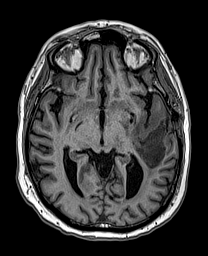
[im 82/144]
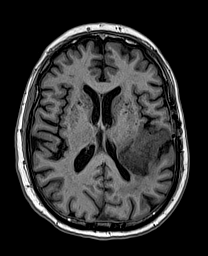
[im 103/144]
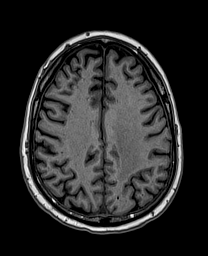
[im 123/144]
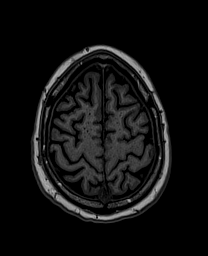
[im 144/144]
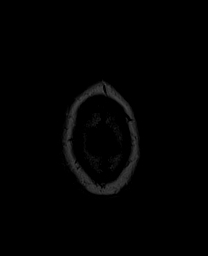

[Series 13: cor dwi_tracew · coronal · 5.0mm · 1.53mm/px · 3 of 52 slices shown]
[im 1/52]
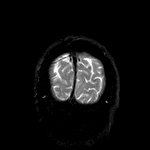
[im 26/52]
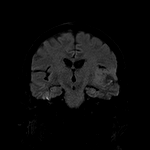
[im 52/52]
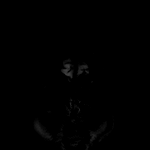

[Series 14: cor dwi_adc · coronal · 5.0mm · 1.53mm/px · 1 of 26 slices shown]
[im 1/26]
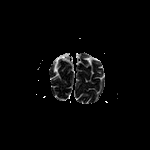

[Series 15: T2 post-contrast · coronal · 5.0mm · 0.57mm/px · 2 of 30 slices shown]
[im 1/30]
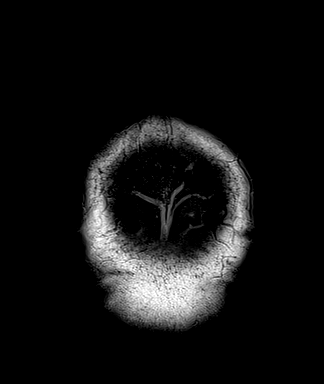
[im 30/30]
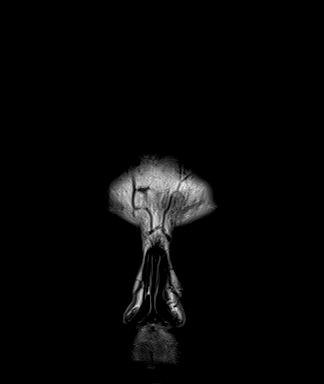

[Series 16: T1 post-contrast · axial · 1.0mm · 0.94mm/px · z∈[-47,+96]mm · 8 of 144 slices shown (1 of 3)]
[im 1/144]
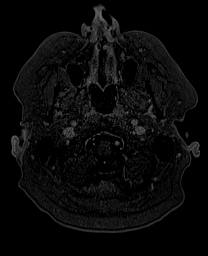
[im 21/144]
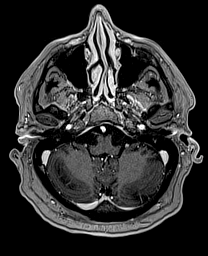
[im 41/144]
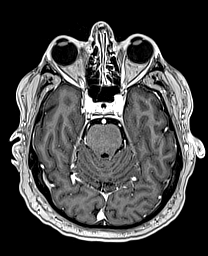
[im 62/144]
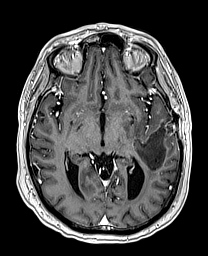
[im 82/144]
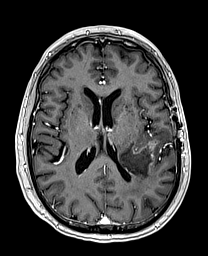
[im 103/144]
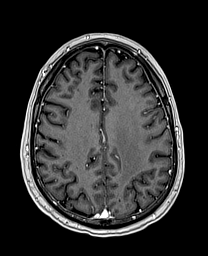
[im 123/144]
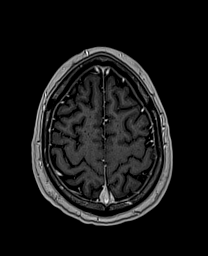
[im 144/144]
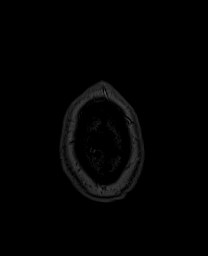

[Series 17: T1 · sagittal · 4.0mm · 0.94mm/px · 2 of 30 slices shown (3 of 4)]
[im 1/30]
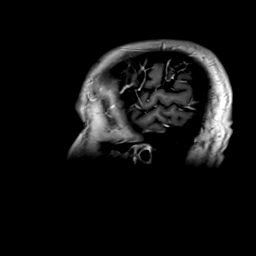
[im 30/30]
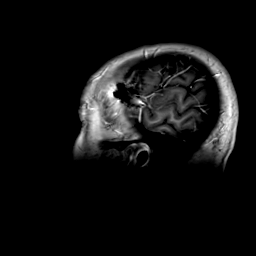

[Series 18: T1 · coronal · 4.0mm · 0.94mm/px · 2 of 37 slices shown (4 of 4)]
[im 1/37]
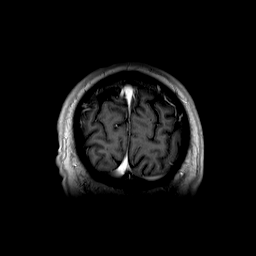
[im 37/37]
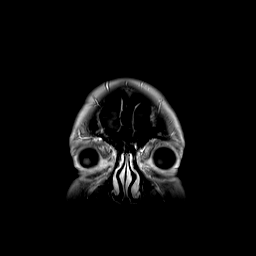

[Series 19: T1 post-contrast · coronal · 5.0mm · 0.43mm/px · 2 of 30 slices shown (2 of 3)]
[im 1/30]
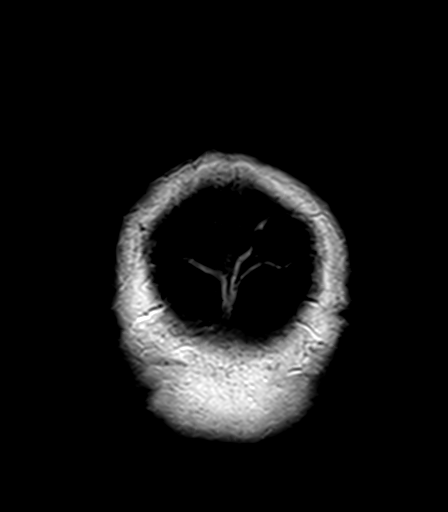
[im 30/30]
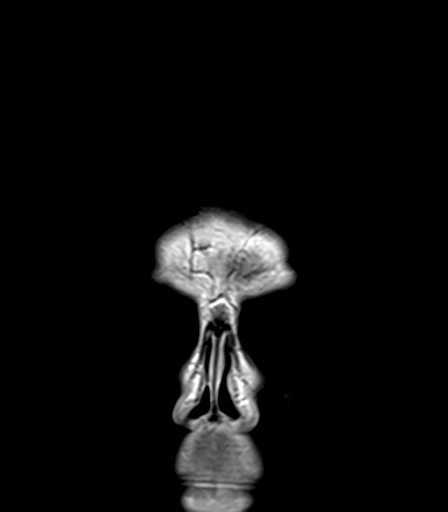

[Series 20: T1 post-contrast · sagittal · 5.0mm · 0.75mm/px · 1 of 24 slices shown (3 of 3)]
[im 1/24]
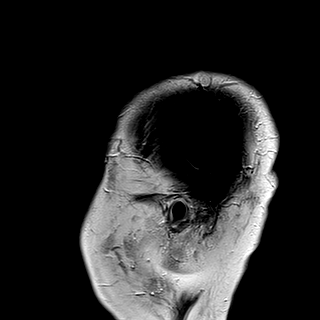

[48 of 48 positions shown; findings below may reference images not displayed]

FINDINGS: Brain: Glioblastoma in the superior left temporal lobe shows
decreased rim enhancement, necrotic center contraction, and regional
decrease in T2 hyperintensity and swelling. Midline shift is
resolved. No evidence of intraventricular/CSF spread. No
complicating infarct, hydrocephalus, or collection.

Vascular: Normal flow voids and vascular enhancements

Skull and upper cervical spine: Unremarkable left-sided craniotomy.

Sinuses/Orbits: Negative
IMPRESSION: Decreased enhancement, T2 signal, and swelling. Midline shift has
resolved.

## 2020-12-06 MED ORDER — GADOBENATE DIMEGLUMINE 529 MG/ML IV SOLN
10.0000 mL | Freq: Once | INTRAVENOUS | Status: AC | PRN
  Administered 2020-12-06: 10 mL via INTRAVENOUS

## 2020-12-10 ENCOUNTER — Encounter: Payer: Self-pay | Admitting: Physical Therapy

## 2020-12-10 ENCOUNTER — Encounter: Payer: 59 | Admitting: Speech Pathology

## 2020-12-10 ENCOUNTER — Ambulatory Visit: Payer: 59 | Admitting: Physical Therapy

## 2020-12-10 ENCOUNTER — Other Ambulatory Visit: Payer: Self-pay

## 2020-12-10 ENCOUNTER — Encounter: Payer: 59 | Admitting: Occupational Therapy

## 2020-12-10 DIAGNOSIS — R2689 Other abnormalities of gait and mobility: Secondary | ICD-10-CM

## 2020-12-10 DIAGNOSIS — M6281 Muscle weakness (generalized): Secondary | ICD-10-CM | POA: Diagnosis not present

## 2020-12-10 DIAGNOSIS — R278 Other lack of coordination: Secondary | ICD-10-CM

## 2020-12-10 DIAGNOSIS — R2681 Unsteadiness on feet: Secondary | ICD-10-CM

## 2020-12-10 NOTE — Patient Instructions (Signed)
Access Code: [email or web address] URL: [email or web address] Date: 12/10/2020 Prepared by: Janann August  Exercises Standing Marching - 1-2 x daily - 4 x weekly - 3 sets Tandem Walking with Counter Support - 1-2 x daily - 4 x weekly - 3 sets Heel Toe Raises with Counter Support - 1-2 x daily - 4 x weekly - 2 sets - 10 reps Romberg Stance Eyes Closed on Foam Pad - 1-2 x daily - 4 x weekly - 3 sets - 30 reps Narrow Stance with Eyes Closed and Head Nods on Foam Pad - 1-2 x daily - 4 x weekly - 2 sets - 10 reps Narrow Stance with Eyes Closed and Head Rotation on Foam Pad - 1-2 x daily - 4 x weekly - 2 sets - 10 reps [place] with Counter Support - 1 x daily - 4 x weekly - 3 sets

## 2020-12-10 NOTE — Therapy (Signed)
[facility] [address] [place], [place], [address] Phone: [phone]   Fax:  [phone]  Physical Therapy Treatment/Re-Cert  Patient Details  Name: Christopher Tucker MRN: [id number] Date of Birth: 02/22/1961 Referring Provider (PT): [name], MD   Encounter Date: 12/10/2020   PT End of Session - 12/10/20 1642    Visit Number 7    Number of Visits 11    Date for PT Re-Evaluation 03/10/21   POC for 12 weeks, pt taking a 4 week break with therapy   Authorization Type Montez Hageman 2022 (Oct 23 2020 - Oct 22 2021)    PT Start Time 1230    PT Stop Time 1314    PT Time Calculation (min) 44 min    Equipment Utilized During Treatment Gait belt    Activity Tolerance Patient tolerated treatment well    Behavior During Therapy WFL for tasks assessed/performed           Past Medical History:  Diagnosis Date  . High cholesterol   . History of kidney stones     Past Surgical History:  Procedure Laterality Date  . APPLICATION OF CRANIAL NAVIGATION Left 04/25/2020   Procedure: APPLICATION OF CRANIAL NAVIGATION;  Surgeon: Consuella Lose, MD;  Location: [facility];  Service: Neurosurgery;  Laterality: Left;  . CRANIOTOMY Left 04/25/2020   Procedure: STEREOTACTIC LEFT TEMPORAL CRANIOTOMY FOR TUMOR;  Surgeon: Consuella Lose, MD;  Location: [facility];  Service: Neurosurgery;  Laterality: Left;  . HERNIA REPAIR Left    groin     There were no vitals filed for this visit.   Subjective Assessment - 12/10/20 1232    Subjective No changes, waiting to hear back about the MRI results.    Patient is accompained by: Family member    Pertinent History left temporal anaplastic astrocytoma (s/p Stereotactic left temporal craniotomy for biopsy of L temporal lobe tumor on 04/25/20)    Limitations Walking;House hold activities;Other (comment)    Patient Stated Goals improve balance and walking and become more active    Currently in Pain?  No/denies              [facility] PT Assessment - 12/10/20 1301      Assessment   Medical Diagnosis Glioblastoma Grade IV    Referring Provider (PT) [name], MD      Prior Function   Level of Independence Independent                         [facility] Adult PT Treatment/Exercise - 12/10/20 1301      Transfers   Transfers Sit to Stand;Stand to Sit    Sit to Stand 6: Modified independent (Device/Increase time)    Five time sit to stand comments  12.63 with no UE support    Stand to Sit 6: Modified independent (Device/Increase time)              12/10/20 1251  Functional Gait  Assessment  Gait assessed  Yes  Gait Level Surface 3  Change in Gait Speed 3  Gait with Horizontal Head Turns 3  Gait with Vertical Head Turns 3  Gait and Pivot Turn 3  Step Over Obstacle 3  Gait with Narrow Base of Support 1  Gait with Eyes Closed 0  Ambulating Backwards 3  Steps 3  Total Score 25  FGA comment: 25/30      Balance Exercises - 12/10/20 0001  Balance Exercises: Standing   Rockerboard Anterior/posterior;EO   Rockerboard Limitations Standing on rockerboard in A/P direction: tapping LLE to cone with stance on RLE x10 reps, then stepping onto rockerboard with RLE and tapping LLE to cone x10 reps, initial cues for technique          NMR:  -walking with eyes closed at countertop with fingertip support down and back 3 reps    Access Code: [email or web address] URL: [email or web address] Date: 12/10/2020 Prepared by: Janann August  Pt having a good day today and reports some of the exercises are getting easier. Showed pt ways to modify/make more challenging for home if pt is having a good day like today. Showed pt and pt's spouse where to purchase a balance pad for home off [address] to make corner balance exercises more challenging.   Exercises Standing Marching - 1-2 x daily - 4 x weekly - 3 sets, progressions: adding in retro marching as well as  alternating UE lifts for coordination  Tandem Walking with Counter Support - 1-2 x daily - 4 x weekly - 3 sets - adding in head turns and head nods  Heel Toe Raises with Counter Support - 1-2 x daily - 4 x weekly - 2 sets - 10 reps Romberg Stance Eyes Closed on Foam Pad - 1-2 x daily - 4 x weekly - 3 sets - 30 reps Narrow Stance with Eyes Closed and Head Nods on Foam Pad - 1-2 x daily - 4 x weekly - 2 sets - 10 reps - with feet slightly apart  Narrow Stance with Eyes Closed and Head Rotation on Foam Pad - 1-2 x daily - 4 x weekly - 2 sets - 10 reps - with feet slightly apart  Carioca with Counter Support - 1 x daily - 4 x weekly - 3 sets   PT Education - 12/10/20 1315    Education Details pt to take a break from therapy for a month and to work on exercises at home, will get scheduled for additional appts in April for 4 weeks and will call back at that time on whether he wishes to continue or not, reviewed and upgraded HEP as appropriate.    Person(s) Educated Patient;Spouse    Methods Explanation;Demonstration;Handout    Comprehension Verbalized understanding;Returned demonstration            PT Short Term Goals - 11/14/20 1722      PT SHORT TERM GOAL #1   Title Pt will be independent with initial HEP in order to build upon functional gains made in therapy. ALL STGS DUE 11/15/20    Baseline reviewed previous exercises and added additional ones on 11/14/20    Time 4    Period Weeks    Status Partially Met    Target Date 11/15/20      PT SHORT TERM GOAL #2   Title Pt will trial R foot up brace for improved foot clearance with gait when more fatigued.    Time 4    Period Weeks    Status Achieved      PT SHORT TERM GOAL #3   Title Pt will improve FGA score to at least 23/30 in order to demo decr fall risk.    Baseline 21/30    Time 4    Period Weeks    Status New      PT SHORT TERM GOAL #4   Title Pt will ambulate at least 200' over outdoor paved surfaces with supervision with no  AD vs. LRAD in order to demo improved community mobility.    Time 4    Period Weeks    Status New      PT SHORT TERM GOAL #5   Title Pt and pt's wife will verbalize understanding of fall prevention in the home.    Time 4    Period Weeks    Status New             PT Long Term Goals - 12/10/20 1259      PT LONG TERM GOAL #1   Title Pt will be independent with final HEP in order to build upon functional gains made in therapy. ALL LTGS DUE 12/13/20    Baseline reviewed and updated HEP on 12/10/20, pt would benefit from future additions    Time 8    Period Weeks    Status On-going      PT LONG TERM GOAL #2   Title Pt will improve FGA score to at least 25/30 in order to demo decr fall risk.    Baseline 21/30, 25/30 on 12/10/20    Time 8    Period Weeks    Status Achieved      PT LONG TERM GOAL #3   Title Pt will perform at least 3 steps with LRAD vs. step to pattern with no handrail with supevision in order to safely enter/exit house.    Baseline 4 steps with no handrail and step through pattern    Time 8    Period Weeks    Status Achieved      PT LONG TERM GOAL #4   Title Pt will ambulate at least 500' over outdoor paved surfaces with supervision with no AD vs. LRAD in order to demo improved community mobility    Baseline not assessed today    Time 8    Period Weeks    Status New      PT LONG TERM GOAL #5   Title Pt will maintain 5x sit <> stand time of 11.91 seconds or less in order to demo improved functional BLE strength and decr fall risk.    Baseline 11.91 seconds, 12.63 with no UE support on 12/10/20    Time 8    Period Weeks    Status Partially Met          Revised/ongoing LTGs:      PT Long Term Goals - 12/10/20 1654      PT LONG TERM GOAL #1   Title Pt will be independent with final HEP in order to build upon functional gains made in therapy. ALL LTGS DUE 02/04/21    Baseline reviewed and updated HEP on 12/10/20, pt would benefit from future additions     Time 8   due to delay in scheduling   Period Weeks    Status On-going    Target Date 02/04/21      PT LONG TERM GOAL #2   Title Pt will improve FGA score to at least 27/30 in order to demo decr fall risk.    Baseline 21/30, 25/30 on 12/10/20    Time 8    Period Weeks    Status Revised      PT LONG TERM GOAL #3   Title Will perform 30 second chair stand to assess for functional strength with LTG written as appropriate.    Time 8    Period Weeks    Status New      PT LONG TERM GOAL #4  Title Pt will ambulate at least 500' over outdoor paved/grass surfaces with supervision with no AD vs. LRAD in order to demo improved community mobility    Baseline not assessed today    Time 8    Period Weeks    Status On-going               Plan - 12/10/20 1648    Clinical Impression Statement Pt wishes to take a break from therapy at this time - would like to work on his own for about a month and return in approx. 4 weeks for another month of therapy. Will call clinic if anything changes. Pt reporting having good days and bad days due to his medication/infusions. Reports today is a good day. Reviewed pt's HEP for balance and upgraded as appropriate when pt is having a better balance day. Pt met LTG #2, improved FGA score to a 25/30 (previously 21/30). Pt with little change in 5x sit <> stand (performed in 12.63 seconds today and before was 11.91 seconds). Will revise and update LTGs as appropriate.    Personal Factors and Comorbidities Comorbidity 1    Comorbidities Glioblastoma Grade IV    Examination-Activity Limitations Stand;Transfers;Stairs;Locomotion Level    Examination-Participation Restrictions Community Activity;Occupation;Driving    Stability/Clinical Decision Making Evolving/Moderate complexity    Rehab Potential Good    PT Frequency 1x / week    PT Duration 4 weeks    PT Treatment/Interventions ADLs/Self Care Home Management;DME Instruction;Gait training;Stair  training;Therapeutic activities;Functional mobility training;Therapeutic exercise;Balance training;Neuromuscular re-education;Patient/family education;Vestibular;Energy conservation;Orthotic Fit/Training    PT Next Visit Plan how is pt doing? reassess balance assessments as appropriate. how is HEP?    PT Home Exercise Plan [email or web address]    Consulted and Agree with Plan of Care Patient           Patient will benefit from skilled therapeutic intervention in order to improve the following deficits and impairments:  Abnormal gait,Decreased balance,Decreased activity tolerance,Difficulty walking,Decreased strength  Visit Diagnosis: Unsteadiness on feet  Muscle weakness (generalized)  Other abnormalities of gait and mobility  Other lack of coordination     Problem List Patient Active Problem List   Diagnosis Date Noted  . Glioblastoma, IDH-wildtype ([facility]) 05/14/2020  . Brain tumor ([facility]) 04/25/2020  . HYPERLIPIDEMIA 07/31/2008  . SCIATICA, ACUTE 07/31/2008    [name], PT,DPT  12/10/2020, 4:53 PM  [facility] [address] [place], [place], [address] Phone: [phone]   Fax:  [phone]  Name: Somtochukwu Woollard MRN: [id number] Date of Birth: [birth year]

## 2020-12-11 ENCOUNTER — Ambulatory Visit (HOSPITAL_COMMUNITY): Payer: 59

## 2020-12-11 ENCOUNTER — Telehealth: Payer: Self-pay | Admitting: Nurse Practitioner

## 2020-12-11 NOTE — Telephone Encounter (Signed)
Left message with patient's wife [name], to see if we could change the Palliative in-home f/u visit to a telehealth f/u visit on 01/02/21, left my name and call back number requesting a return call.

## 2020-12-12 ENCOUNTER — Ambulatory Visit: Payer: 59 | Admitting: Physical Therapy

## 2020-12-12 ENCOUNTER — Encounter: Payer: 59 | Admitting: Speech Pathology

## 2020-12-12 ENCOUNTER — Encounter: Payer: 59 | Admitting: Occupational Therapy

## 2020-12-14 ENCOUNTER — Inpatient Hospital Stay: Payer: 59

## 2020-12-14 ENCOUNTER — Inpatient Hospital Stay (HOSPITAL_BASED_OUTPATIENT_CLINIC_OR_DEPARTMENT_OTHER): Payer: 59 | Admitting: Internal Medicine

## 2020-12-14 ENCOUNTER — Telehealth: Payer: Self-pay | Admitting: Nurse Practitioner

## 2020-12-14 ENCOUNTER — Other Ambulatory Visit: Payer: Self-pay

## 2020-12-14 VITALS — BP 128/94 | HR 64

## 2020-12-14 VITALS — BP 120/94 | HR 95 | Temp 97.5°F | Resp 19 | Ht 71.0 in | Wt 198.7 lb

## 2020-12-14 DIAGNOSIS — C719 Malignant neoplasm of brain, unspecified: Secondary | ICD-10-CM

## 2020-12-14 DIAGNOSIS — Z5112 Encounter for antineoplastic immunotherapy: Secondary | ICD-10-CM | POA: Diagnosis not present

## 2020-12-14 LAB — CBC WITH DIFFERENTIAL/PLATELET
Abs Immature Granulocytes: 0.01 10*3/uL (ref 0.00–0.07)
Basophils Absolute: 0.1 10*3/uL (ref 0.0–0.1)
Basophils Relative: 1 %
Eosinophils Absolute: 0 10*3/uL (ref 0.0–0.5)
Eosinophils Relative: 1 %
HCT: 40.3 % (ref 39.0–52.0)
Hemoglobin: 13.5 g/dL (ref 13.0–17.0)
Immature Granulocytes: 0 %
Lymphocytes Relative: 15 %
Lymphs Abs: 0.7 10*3/uL (ref 0.7–4.0)
MCH: 31.6 pg (ref 26.0–34.0)
MCHC: 33.5 g/dL (ref 30.0–36.0)
MCV: 94.4 fL (ref 80.0–100.0)
Monocytes Absolute: 0.6 10*3/uL (ref 0.1–1.0)
Monocytes Relative: 13 %
Neutro Abs: 3.4 10*3/uL (ref 1.7–7.7)
Neutrophils Relative %: 70 %
Platelets: 206 10*3/uL (ref 150–400)
RBC: 4.27 MIL/uL (ref 4.22–5.81)
RDW: 13.6 % (ref 11.5–15.5)
WBC: 4.8 10*3/uL (ref 4.0–10.5)
nRBC: 0 % (ref 0.0–0.2)

## 2020-12-14 LAB — COMPREHENSIVE METABOLIC PANEL
ALT: 31 U/L (ref 0–44)
AST: 22 U/L (ref 15–41)
Albumin: 3.8 g/dL (ref 3.5–5.0)
Alkaline Phosphatase: 57 U/L (ref 38–126)
Anion gap: 10 (ref 5–15)
BUN: 13 mg/dL (ref 6–20)
CO2: 22 mmol/L (ref 22–32)
Calcium: 9.2 mg/dL (ref 8.9–10.3)
Chloride: 111 mmol/L (ref 98–111)
Creatinine, Ser: 0.86 mg/dL (ref 0.61–1.24)
GFR, Estimated: >60 mL/min (ref >60)
Glucose, Bld: 118 mg/dL — ABNORMAL HIGH (ref 70–99)
Potassium: 3.7 mmol/L (ref 3.5–5.1)
Sodium: 143 mmol/L (ref 135–145)
Total Bilirubin: 0.9 mg/dL (ref 0.3–1.2)
Total Protein: 7 g/dL (ref 6.5–8.1)

## 2020-12-14 LAB — TOTAL PROTEIN, URINE DIPSTICK: Protein, ur: NEGATIVE mg/dL

## 2020-12-14 MED ORDER — SODIUM CHLORIDE 0.9 % IV SOLN
Freq: Once | INTRAVENOUS | Status: AC
  Filled 2020-12-14: qty 250

## 2020-12-14 MED ORDER — SODIUM CHLORIDE 0.9 % IV SOLN
10.0000 mg/kg | Freq: Once | INTRAVENOUS | Status: AC
  Administered 2020-12-14: 900 mg via INTRAVENOUS
  Filled 2020-12-14: qty 4

## 2020-12-14 NOTE — Progress Notes (Signed)
[facility] [place], [place] [address] [phone]   Interval Evaluation  Date of Service: 12/14/20 Patient Name: Christopher Tucker Patient MRN: [id number] Patient DOB: 08-09-1961 Provider: [name], MD  Identifying Statement:  Christopher Tucker is a 60 y.o. male with left temporal anaplastic astrocytoma   Oncologic History: Oncology History  Glioblastoma, IDH-wildtype ([facility])  05/14/2020 Initial Diagnosis   Anaplastic astrocytoma, IDH-wildtype ([facility])   05/21/2020 - 07/02/2020 Radiation Therapy   IMRT and concurrent Temodar with Dr. Lisbeth Renshaw   07/30/2020 -  Chemotherapy   The patient had dexamethasone (DECADRON) 4 MG tablet, 1 of 1 cycle, Start date: --, End date: -- temozolomide (TEMODAR) 5 MG capsule, 200 mg/m2/day = 410 mg, Oral, Daily, 1 of 1 cycle, Start date: --, End date: -- temozolomide (TEMODAR) 20 MG capsule, 200 mg/m2/day = 400 mg, Oral, Daily, 1 of 1 cycle, Start date: --, End date: -- temozolomide (TEMODAR) 100 MG capsule, 150 mg/m2/day = 300 mg, Oral, Daily, 1 of 1 cycle, Start date: 07/30/2020, End date: 08/04/2020 temozolomide (TEMODAR) 140 MG capsule, 200 mg/m2/day = 420 mg, Oral, Daily, 1 of 1 cycle, Start date: --, End date: --  for chemotherapy treatment.    10/18/2020 -  Chemotherapy    Patient is on Treatment Plan: BRAIN ANAPLASTIC GLIOMA GRADE III TEMOZOLOMIDE POST XRT Q28D   Patient is on Antibody Plan: BRAIN GBM BEVACIZUMAB 14D X 6 CYCLES       Biomarkers:  MGMT Unknown.  IDH 1/2 Wild type.  EGFR Unknown  TERT "Mutated   Interval History:  Christopher Tucker presents today following recent MRI brain. Doesn't describe changes in speech this week, and right side remains stable.  Gait remains independent.  Continues to describe ongoing swelling in his face, neck and upper back, now off decadron and on hydrocortisone 42m BID.  Sleep volume has improved since prior visit.  Has not started the marinol or ritalin as  offerred. Denies seizures or headaches.      Decadron: 10/05/20: 859m1/28/22: 70m26m/24/22: 1mg470m04/22: -  H+P (05/10/20) Patient presented to medical attention in early August with several months history of sensory episodes.  They are described as "numbness marching down right arm also involving the leg, lasting for several minutes".  There is no post event weakness or confusion.  No known history of seizures.  In recent weeks he has also experienced some difficulty with expressive language, finding particular words and maintaining high fluency.  Since starting Keppra post-operatively he has not had any recurrence of these sensory events.  He has weaned off dexamethasone.  We are still awaiting finalized pathology results.    Medications: Current Outpatient Medications on File Prior to Visit  Medication Sig Dispense Refill  . hydrocortisone (CORTEF) 10 MG tablet Take 1 tablet (10 mg total) by mouth 2 (two) times daily. 60 tablet 1  . ibuprofen (ADVIL) 200 MG tablet Take 400 mg by mouth 2 (two) times daily.    . leMarland KitchenETIRAcetam (KEPPRA) 500 MG tablet TAKE 1 TABLET BY MOUTH TWICE A DAY 60 tablet 1  . lisinopril (PRINIVIL) 10 MG tablet Take 2 tablets (20 mg total) by mouth daily. 60 tablet 3  . ondansetron (ZOFRAN) 8 MG tablet Take 1 tablet (8 mg total) by mouth 2 (two) times daily as needed (nausea and vomiting). May take 30-60 minutes prior to Temodar administration if nausea/vomiting occurs. 30 tablet 1  . sertraline (ZOLOFT) 25 MG tablet TAKE 1 TABLET (  25 MG TOTAL) BY MOUTH DAILY. 30 tablet 3  . simvastatin (ZOCOR) 40 MG tablet Take 40 mg by mouth at bedtime.    . dronabinol (MARINOL) 2.5 MG capsule Take 1 capsule (2.5 mg total) by mouth 2 (two) times daily before a meal. (Patient not taking: Reported on 12/14/2020) 60 capsule 2  . methylphenidate (RITALIN) 5 MG tablet Take 1 tablet (5 mg total) by mouth 2 (two) times daily. (Patient not taking: Reported on 12/14/2020) 60 tablet 0   No current  facility-administered medications on file prior to visit.    Allergies: No Known Allergies Past Medical History:  Past Medical History:  Diagnosis Date  . High cholesterol   . History of kidney stones    Past Surgical History:  Past Surgical History:  Procedure Laterality Date  . APPLICATION OF CRANIAL NAVIGATION Left 04/25/2020   Procedure: APPLICATION OF CRANIAL NAVIGATION;  Surgeon: Consuella Lose, MD;  Location: [facility];  Service: Neurosurgery;  Laterality: Left;  . CRANIOTOMY Left 04/25/2020   Procedure: STEREOTACTIC LEFT TEMPORAL CRANIOTOMY FOR TUMOR;  Surgeon: Consuella Lose, MD;  Location: [facility];  Service: Neurosurgery;  Laterality: Left;  . HERNIA REPAIR Left    groin    Social History:  Social History   Socioeconomic History  . Marital status: Married    Spouse name: Not on file  . Number of children: Not on file  . Years of education: Not on file  . Highest education level: Not on file  Occupational History  . Not on file  Tobacco Use  . Smoking status: Never Smoker  . Smokeless tobacco: Never Used  Substance and Sexual Activity  . Alcohol use: Yes    Alcohol/week: 6.0 standard drinks    Types: 3 Glasses of wine, 3 Cans of beer per week  . Drug use: Not Currently  . Sexual activity: Not on file  Other Topics Concern  . Not on file  Social History Narrative  . Not on file   Social Determinants of Health   Financial Resource Strain: Not on file  Food Insecurity: Not on file  Transportation Needs: Not on file  Physical Activity: Not on file  Stress: Not on file  Social Connections: Not on file  Intimate Partner Violence: Not on file   Family History: No family history on file.  Review of Systems: Constitutional: Doesn't report fevers, chills or abnormal weight loss Eyes: Doesn't report blurriness of vision Ears, nose, mouth, throat, and face: Doesn't report sore throat Respiratory: Doesn't report cough, dyspnea or wheezes Cardiovascular: Doesn't  report palpitation, chest discomfort  Gastrointestinal:  Doesn't report nausea, constipation, diarrhea GU: Doesn't report incontinence Skin: Doesn't report skin rashes Neurological: Per HPI Musculoskeletal: Doesn't report joint pain Behavioral/Psych: Doesn't report anxiety  Physical Exam: Vitals:   12/14/20 0849  BP: (!) 120/94  Pulse: 95  Resp: 19  Temp: (!) 97.5 F (36.4 C)  SpO2: 98%   KPS: 70. General: cushingoid appearance Head: Normal EENT: No conjunctival injection or scleral icterus.  Lungs: Resp effort normal Cardiac: Regular rate Abdomen: Non-distended abdomen Skin: No rashes cyanosis or petechiae. Extremities: No clubbing or edema  Neurologic Exam: Mental Status: Awake, alert, attentive to examiner. Oriented to self and environment. Language c/w transcortical expressive dyshpasia Cranial Nerves: Visual acuity is grossly normal. Right field hemianopia. Extra-ocular movements intact. No ptosis. Face is symmetric Motor: Tone and bulk are normal. 4+/5 in right arm and leg. Reflexes are symmetric, no pathologic reflexes present.  Sensory: Intact to light touch  Gait: Dystaxic, hemiparetic  Labs: I have reviewed the data as listed    Component Value Date/Time   NA 142 11/30/2020 0821   K 3.6 11/30/2020 0821   CL 111 11/30/2020 0821   CO2 23 11/30/2020 0821   GLUCOSE 161 (H) 11/30/2020 0821   BUN 11 11/30/2020 0821   CREATININE 0.88 11/30/2020 0821   CREATININE 1.07 09/17/2020 0823   CALCIUM 9.2 11/30/2020 0821   PROT 6.8 11/30/2020 0821   ALBUMIN 3.4 (L) 11/30/2020 0821   AST 28 11/30/2020 0821   AST 24 09/17/2020 0823   ALT 37 11/30/2020 0821   ALT 50 (H) 09/17/2020 0823   ALKPHOS 69 11/30/2020 0821   BILITOT 0.9 11/30/2020 0821   BILITOT 1.2 09/17/2020 0823   GFRNONAA >60 11/30/2020 0821   GFRNONAA >60 09/17/2020 0823   GFRAA >60 06/14/2020 0828   Lab Results  Component Value Date   WBC 4.8 12/14/2020   NEUTROABS 3.4 12/14/2020   HGB 13.5  12/14/2020   HCT 40.3 12/14/2020   MCV 94.4 12/14/2020   PLT 206 12/14/2020   Imaging:  [facility] Clinician Interpretation: I have personally reviewed the CNS images as listed.  My interpretation, in the context of the patient's clinical presentation, is stable disease  MR BRAIN W WO CONTRAST  Result Date: 12/07/2020 CLINICAL DATA:  Follow-up glioblastoma EXAM: MRI HEAD WITHOUT AND WITH CONTRAST TECHNIQUE: Multiplanar, multiecho pulse sequences of the brain and surrounding structures were obtained without and with intravenous contrast. CONTRAST:  21m MULTIHANCE GADOBENATE DIMEGLUMINE 529 MG/ML IV SOLN COMPARISON:  10/04/2020 FINDINGS: Brain: Glioblastoma in the superior left temporal lobe shows decreased rim enhancement, necrotic center contraction, and regional decrease in T2 hyperintensity and swelling. Midline shift is resolved. No evidence of intraventricular/CSF spread. No complicating infarct, hydrocephalus, or collection. Vascular: Normal flow voids and vascular enhancements Skull and upper cervical spine: Unremarkable left-sided craniotomy. Sinuses/Orbits: Negative IMPRESSION: Decreased enhancement, T2 signal, and swelling. Midline shift has resolved. Electronically Signed   By: JMonte FantasiaM.D.   On: 12/07/2020 07:18    Assessment/Plan Glioblastoma, IDH-wildtype ([place] [C71.9]  MMarsha Hillmanis clinically and radiographically stable today.  Labs are within normal limits for Avastin today.    Energy and appetite seem improved on the hydrocortisone 168mBID.  We recommended continuing treatment with Avastin 1043mg IV q2 weeks.    Avastin should be held for the following:  ANC less than 1500  Platelets less than 50,000  LFT or creatinine greater than 2x ULN  If clinical concerns/contraindications develop  Should continue Lisinopril 37m34mily for HTN.   Will continue zoloft 25mg13mly.  We ask that Kolsen Davine Cobarn to clinic in 2 weeks for next avastin treatment following  MRI brain for evaluation.  All questions were answered. The patient knows to call the clinic with any problems, questions or concerns. No barriers to learning were detected.  I have spent a total of 40 minutes of face-to-face and non-face-to-face time, excluding clinical staff time, preparing to see patient, ordering tests and/or medications, counseling the patient, and independently interpreting results and communicating results to the patient/family/caregiver    ZachaVentura SellersMedical Director of Neuro-Oncology [facility]5/22 8:57 AM

## 2020-12-14 NOTE — Telephone Encounter (Signed)
Ret'd call to wife, [name], and discussed rescheduling the Palliative f/u visit to 01/02/21 and she said that was not going to work for them.    I spoke with the Palliative NP and she wanted to see if we could schedule if to 12/25/20 @ 10 AM.  Called wife back and she was in agreement with rescheduling f/u visit to 12/25/20 @ 10 AM.

## 2020-12-14 NOTE — Patient Instructions (Signed)
[facility] Discharge Instructions for Patients Receiving Chemotherapy  Today you received the following immunotherapy agent: Bevacizumab  To help prevent nausea and vomiting after your treatment, we encourage you to take your nausea medication as directed by your MD.   If you develop nausea and vomiting that is not controlled by your nausea medication, call the clinic.   BELOW ARE SYMPTOMS THAT SHOULD BE REPORTED IMMEDIATELY:  *FEVER GREATER THAN 100.5 F  *CHILLS WITH OR WITHOUT FEVER  NAUSEA AND VOMITING THAT IS NOT CONTROLLED WITH YOUR NAUSEA MEDICATION  *UNUSUAL SHORTNESS OF BREATH  *UNUSUAL BRUISING OR BLEEDING  TENDERNESS IN MOUTH AND THROAT WITH OR WITHOUT PRESENCE OF ULCERS  *URINARY PROBLEMS  *BOWEL PROBLEMS  UNUSUAL RASH Items with * indicate a potential emergency and should be followed up as soon as possible.  Feel free to call the clinic should you have any questions or concerns. The clinic phone number is [phone].  Please show the [facility] at check-in to the Emergency Department and triage nurse.

## 2020-12-18 ENCOUNTER — Other Ambulatory Visit (HOSPITAL_COMMUNITY): Payer: Self-pay

## 2020-12-25 ENCOUNTER — Other Ambulatory Visit: Payer: Self-pay | Admitting: Nurse Practitioner

## 2020-12-25 ENCOUNTER — Encounter: Payer: Self-pay | Admitting: Nurse Practitioner

## 2020-12-25 ENCOUNTER — Other Ambulatory Visit: Payer: Self-pay | Admitting: Internal Medicine

## 2020-12-25 ENCOUNTER — Encounter: Payer: Self-pay | Admitting: Internal Medicine

## 2020-12-25 ENCOUNTER — Other Ambulatory Visit: Payer: Self-pay

## 2020-12-25 DIAGNOSIS — C719 Malignant neoplasm of brain, unspecified: Secondary | ICD-10-CM

## 2020-12-25 DIAGNOSIS — Z515 Encounter for palliative care: Secondary | ICD-10-CM

## 2020-12-25 DIAGNOSIS — G893 Neoplasm related pain (acute) (chronic): Secondary | ICD-10-CM

## 2020-12-25 MED ORDER — LISINOPRIL 10 MG PO TABS: 30.0000 mg | ORAL_TABLET | Freq: Every day | ORAL | 3 refills | Status: DC

## 2020-12-25 NOTE — Progress Notes (Signed)
[facility] Consult Note Telephone: [phone]  Fax: [phone]  PATIENT NAME: Jaequan Propes DOB: Oct 24, 1960 MRN: [id number]  PRIMARY CARE PROVIDER:   Alroy Dust, L.Marlou Sa, MD  REFERRING PROVIDER:  Alroy Dust, Carlean Jews.Marlou Sa, [place] [employer] [place],  [place] [address]   RESPONSIBLE PARTY:   Self; Wife Aleksa Catterton  1.Advance Care Planning;Full code;  Ongoing discussions  2. Goals of Care: Goals include to maximize quality of life and symptom management. Our advance care planning conversation included a discussion about:   The value and importance of advance care planning  Exploration of personal, cultural or spiritual beliefs that might influence medical decisions  Exploration of goals of care in the event of a sudden injury or illness  Identification and preparation of a healthcare agent  Review and updating or creation of anadvance directive document.  3.Palliative care encounter; Palliative care encounter; Palliative medicine team will continue to support patient, patient's family, and medical team. Visit consisted of counseling and education dealing with the complex and emotionally intense issues of symptom management and palliative care in the setting of serious and potentially life-threatening illness  4. f/u2 month for ongoing monitoring chronic disease progression, ongoing discussions complex medical decision making  5. Chronic pain; discussed concerning therapy pain modality. Mr. Bellin will explore option as goal is to minimize medication regimen as able.   I spent 60 minutes providing this consultation,  from 10:00am to 11:00am. More than 50% of the time in this consultation was spent coordinating communication.   HISTORY OF PRESENT ILLNESS:  Jameel Quant is a 60 y.o. year old male with multiple medical problems including Glioblastoma, IDH-wildtype, craniotomy, hyperlipidemia, history of sciatica. Oncology  visit with Dr [name] 12/14/2020 ongoing swelling in his face, neck and upper back, stopped decadron and on hydrocortisone 75m BID. Denied headaches, seizures, remains on keppra. Experiencing difficulty finding particular words, difficulty with expressive language, maintaining high fluency. Plan: continue treatment with avastin, lisinopril for htn, zoloft. MRI showed deviation improved. Return in 2 weeks for next avastin treatment. I called Mrs FAgard Mr. FGellwife to confirm PC f/u visit and covid screening which was negative, confirmed okay to bring CElkhartSW intern, Mrs. FDuddingin agreement. We talked Mrs. FSchummtalked about behavior changes, wishes for discussion about having someone stay with Mr. FWhittleseyduring the day should Mrs. FAungstreturns to in office work. Mrs. FMcquerryhas been able to work from home until recently a possible return to in person office. I visited Mr and Mrs FVandamwith including CThamas JaegersSW. We talked about how Mr. FUyhas been feeling. Mr. FMccahillendorses he is doing fine. He is ambulatory, no recent falls. We talked about appetite, endorses is fair. Mr. FGroleauendorses he has not been sleeping as much during the day. He has been able to sleep at night. We talked about recent Oncology visit with Dr VMickeal Skinner recent MRI and plan. We talked about symptoms of pain. Mr. FHayashiendorses he does have pain in his arm he plans of further discussing with Dr VMickeal Skinnerat his next appointment this week. We talked about what makes it better, Mr. FLevingstonwas vague about symptoms. We did take about neuro-physical therapy which Mr. FMarcouxwas participating in but recently has taken this month off. We talked about therapy with pain modalities. Mr and Mrs. FKaeserendorses they will follow-up as an option and Mr. FStolpendorses he takes many medications. Mr. FStokeendorses he feels like  he may "have to just live with the pain". We talked about quality of life and pain management has  medications but also alternative pain modalities. Reviewed medical goals. We talked about Mrs. Fitch concern option of possible having to return to in-person office a few days a week. Mrs. Kadow endorses Dr [name] recommended for Mr. Reiland not be left alone. Mr. Peary endorses he "does not need a Public librarian". We explored Mr. Malmberg feeling of frustration fo situation, dx, loss of independence even though he is able to perform ADL's,. We explored Mrs. Fortum feelings, the fear as a caregiver she is experiencing. We explored different options such as friends coming with the purpose of an activity to do with Mr. Tith while Mrs. Johnella Moloney works such as Government social research officer. Mr. Johnella Moloney endorses he is avid hiker feels like he can do short hikes. We talked about challenges asking others for help. Options explored and discussed. Mrs Podolak endorses they will continue to discuss prior to making a decision moving forward.We talked about f/u PC visit, Mr. Suddreth in agreement, appointment scheduled. Therapeutic listening, emotional support provided. Questions answered. Contact information.  Palliative Care was asked to help to continue to address goals of care.   ROS: 10pt system reviewed negative except fatigue, pain   Oncologic History:    Oncology History  Glioblastoma, IDH-wildtype ([place])  05/14/2020 Initial Diagnosis   Anaplastic astrocytoma, IDH-wildtype ([place])   05/21/2020 - 07/02/2020 Radiation Therapy   IMRT and concurrent Temodar with Dr. Lisbeth Renshaw   07/30/2020 -  Chemotherapy   The patient had dexamethasone (DECADRON) 4 MG tablet, 1 of 1 cycle, Start date: --, End date: -- temozolomide (TEMODAR) 5 MG capsule, 200 mg/m2/day = 410 mg, Oral, Daily, 1 of 1 cycle, Start date: --, End date: -- temozolomide (TEMODAR) 20 MG capsule, 200 mg/m2/day = 400 mg, Oral, Daily, 1 of 1 cycle, Start date: --, End date: -- temozolomide (TEMODAR) 100 MG capsule, 150 mg/m2/day = 300 mg, Oral, Daily, 1 of 1 cycle, Start date:  07/30/2020, End date: 08/04/2020 temozolomide (TEMODAR) 140 MG capsule, 200 mg/m2/day = 420 mg, Oral, Daily, 1 of 1 cycle, Start date: --, End date: --  for chemotherapy treatment.    10/18/2020 -  Chemotherapy    Patient is on Treatment Plan: BRAIN ANAPLASTIC GLIOMA GRADE III TEMOZOLOMIDE POST XRT Q28D   Patient is on Antibody Plan: BRAIN GBM BEVACIZUMAB 14D X 6 CYCLES       Biomarkers:  MGMT Unknown.  IDH 1/2 Wild type.  EGFR Unknown  TERT "Mutated     CODE STATUS: Full code  PPS: 60% HOSPICE ELIGIBILITY/DIAGNOSIS: TBD  PAST MEDICAL HISTORY:  Past Medical History:  Diagnosis Date  . High cholesterol   . History of kidney stones     SOCIAL HX:  Social History   Tobacco Use  . Smoking status: Never Smoker  . Smokeless tobacco: Never Used  Substance Use Topics  . Alcohol use: Yes    Alcohol/week: 6.0 standard drinks    Types: 3 Glasses of wine, 3 Cans of beer per week    ALLERGIES: No Known Allergies   PERTINENT MEDICATIONS:  Outpatient Encounter Medications as of 12/25/2020  Medication Sig  . dronabinol (MARINOL) 2.5 MG capsule Take 1 capsule (2.5 mg total) by mouth 2 (two) times daily before a meal. (Patient not taking: Reported on 12/14/2020)  . hydrocortisone (CORTEF) 10 MG tablet Take 1 tablet (10 mg total) by mouth 2 (two) times daily.  Marland Kitchen ibuprofen (ADVIL) 200 MG tablet  Take 400 mg by mouth 2 (two) times daily.  Marland Kitchen levETIRAcetam (KEPPRA) 500 MG tablet TAKE 1 TABLET BY MOUTH TWICE A DAY  . methylphenidate (RITALIN) 5 MG tablet Take 1 tablet (5 mg total) by mouth 2 (two) times daily. (Patient not taking: Reported on 12/14/2020)  . ondansetron (ZOFRAN) 8 MG tablet Take 1 tablet (8 mg total) by mouth 2 (two) times daily as needed (nausea and vomiting). May take 30-60 minutes prior to Temodar administration if nausea/vomiting occurs.  . sertraline (ZOLOFT) 25 MG tablet TAKE 1 TABLET (25 MG TOTAL) BY MOUTH DAILY.  . simvastatin (ZOCOR) 40 MG tablet Take 40 mg by  mouth at bedtime.  . [DISCONTINUED] lisinopril (PRINIVIL) 10 MG tablet Take 2 tablets (20 mg total) by mouth daily.   No facility-administered encounter medications on file as of 12/25/2020.    PHYSICAL EXAM:   General: NAD, pleasant male Cardiovascular: regular rate and rhythm Pulmonary: clear ant fields Neurological: ambulatory  Austine Wiedeman Ihor Gully, NP

## 2020-12-27 ENCOUNTER — Inpatient Hospital Stay (HOSPITAL_BASED_OUTPATIENT_CLINIC_OR_DEPARTMENT_OTHER): Payer: 59 | Admitting: Internal Medicine

## 2020-12-27 ENCOUNTER — Inpatient Hospital Stay: Payer: 59

## 2020-12-27 ENCOUNTER — Other Ambulatory Visit: Payer: Self-pay

## 2020-12-27 ENCOUNTER — Inpatient Hospital Stay: Payer: 59 | Attending: Neurosurgery

## 2020-12-27 ENCOUNTER — Other Ambulatory Visit (HOSPITAL_COMMUNITY): Payer: Self-pay

## 2020-12-27 VITALS — BP 144/104 | HR 71 | Temp 96.6°F | Resp 17 | Wt 199.2 lb

## 2020-12-27 VITALS — BP 140/101 | HR 64

## 2020-12-27 DIAGNOSIS — Z7952 Long term (current) use of systemic steroids: Secondary | ICD-10-CM | POA: Diagnosis not present

## 2020-12-27 DIAGNOSIS — C719 Malignant neoplasm of brain, unspecified: Secondary | ICD-10-CM | POA: Diagnosis not present

## 2020-12-27 DIAGNOSIS — I10 Essential (primary) hypertension: Secondary | ICD-10-CM | POA: Diagnosis not present

## 2020-12-27 DIAGNOSIS — Z5112 Encounter for antineoplastic immunotherapy: Secondary | ICD-10-CM | POA: Insufficient documentation

## 2020-12-27 DIAGNOSIS — Z79899 Other long term (current) drug therapy: Secondary | ICD-10-CM | POA: Diagnosis not present

## 2020-12-27 DIAGNOSIS — Z87442 Personal history of urinary calculi: Secondary | ICD-10-CM | POA: Insufficient documentation

## 2020-12-27 DIAGNOSIS — Z923 Personal history of irradiation: Secondary | ICD-10-CM | POA: Diagnosis not present

## 2020-12-27 LAB — COMPREHENSIVE METABOLIC PANEL
ALT: 22 U/L (ref 0–44)
AST: 18 U/L (ref 15–41)
Albumin: 3.8 g/dL (ref 3.5–5.0)
Alkaline Phosphatase: 52 U/L (ref 38–126)
Anion gap: 11 (ref 5–15)
BUN: 14 mg/dL (ref 6–20)
CO2: 22 mmol/L (ref 22–32)
Calcium: 9 mg/dL (ref 8.9–10.3)
Chloride: 110 mmol/L (ref 98–111)
Creatinine, Ser: 0.86 mg/dL (ref 0.61–1.24)
GFR, Estimated: >60 mL/min (ref >60)
Glucose, Bld: 102 mg/dL — ABNORMAL HIGH (ref 70–99)
Potassium: 3.8 mmol/L (ref 3.5–5.1)
Sodium: 143 mmol/L (ref 135–145)
Total Bilirubin: 1.2 mg/dL (ref 0.3–1.2)
Total Protein: 6.8 g/dL (ref 6.5–8.1)

## 2020-12-27 LAB — CBC WITH DIFFERENTIAL/PLATELET
Abs Immature Granulocytes: 0.01 10*3/uL (ref 0.00–0.07)
Basophils Absolute: 0 10*3/uL (ref 0.0–0.1)
Basophils Relative: 1 %
Eosinophils Absolute: 0.1 10*3/uL (ref 0.0–0.5)
Eosinophils Relative: 1 %
HCT: 41.1 % (ref 39.0–52.0)
Hemoglobin: 13.7 g/dL (ref 13.0–17.0)
Immature Granulocytes: 0 %
Lymphocytes Relative: 14 %
Lymphs Abs: 0.7 10*3/uL (ref 0.7–4.0)
MCH: 30.8 pg (ref 26.0–34.0)
MCHC: 33.3 g/dL (ref 30.0–36.0)
MCV: 92.4 fL (ref 80.0–100.0)
Monocytes Absolute: 0.6 10*3/uL (ref 0.1–1.0)
Monocytes Relative: 13 %
Neutro Abs: 3.4 10*3/uL (ref 1.7–7.7)
Neutrophils Relative %: 71 %
Platelets: 190 10*3/uL (ref 150–400)
RBC: 4.45 MIL/uL (ref 4.22–5.81)
RDW: 13.1 % (ref 11.5–15.5)
WBC: 4.8 10*3/uL (ref 4.0–10.5)
nRBC: 0 % (ref 0.0–0.2)

## 2020-12-27 LAB — TOTAL PROTEIN, URINE DIPSTICK: Protein, ur: NEGATIVE mg/dL

## 2020-12-27 MED ORDER — SODIUM CHLORIDE 0.9 % IV SOLN
Freq: Once | INTRAVENOUS | Status: AC
  Filled 2020-12-27: qty 250

## 2020-12-27 MED ORDER — LISINOPRIL 20 MG PO TABS: 40.0000 mg | ORAL_TABLET | Freq: Every day | ORAL | 3 refills | Status: DC

## 2020-12-27 MED ORDER — HYDROCORTISONE 10 MG PO TABS: 10.0000 mg | ORAL_TABLET | Freq: Every day | ORAL | 1 refills | Status: DC

## 2020-12-27 MED ORDER — SODIUM CHLORIDE 0.9 % IV SOLN
10.0000 mg/kg | Freq: Once | INTRAVENOUS | Status: AC
  Administered 2020-12-27: 900 mg via INTRAVENOUS
  Filled 2020-12-27: qty 32

## 2020-12-27 NOTE — Progress Notes (Signed)
[facility] [place], [place] [address] [phone]   Interval Evaluation  Date of Service: 12/27/20 Patient Name: Christopher Tucker Patient MRN: [id number] Patient DOB: 05/22/1961 Provider: [name], MD  Identifying Statement:  Christopher Tucker is a 60 y.o. male with left temporal anaplastic astrocytoma   Oncologic History: Oncology History  Glioblastoma, IDH-wildtype ([place])  05/14/2020 Initial Diagnosis   Anaplastic astrocytoma, IDH-wildtype ([place])   05/21/2020 - 07/02/2020 Radiation Therapy   IMRT and concurrent Temodar with Dr. Lisbeth Renshaw   07/30/2020 -  Chemotherapy   The patient had dexamethasone (DECADRON) 4 MG tablet, 1 of 1 cycle, Start date: --, End date: -- temozolomide (TEMODAR) 5 MG capsule, 200 mg/m2/day = 410 mg, Oral, Daily, 1 of 1 cycle, Start date: --, End date: -- temozolomide (TEMODAR) 20 MG capsule, 200 mg/m2/day = 400 mg, Oral, Daily, 1 of 1 cycle, Start date: --, End date: -- temozolomide (TEMODAR) 100 MG capsule, 150 mg/m2/day = 300 mg, Oral, Daily, 1 of 1 cycle, Start date: 07/30/2020, End date: 08/04/2020 temozolomide (TEMODAR) 140 MG capsule, 200 mg/m2/day = 420 mg, Oral, Daily, 1 of 1 cycle, Start date: --, End date: --  for chemotherapy treatment.    10/18/2020 -  Chemotherapy    Patient is on Treatment Plan: BRAIN ANAPLASTIC GLIOMA GRADE III TEMOZOLOMIDE POST XRT Q28D   Patient is on Antibody Plan: BRAIN GBM BEVACIZUMAB 14D X 6 CYCLES       Biomarkers:  MGMT Unknown.  IDH 1/2 Wild type.  EGFR Unknown  TERT "Mutated   Interval History:  Christopher Tucker presents today following recent MRI brain. No changes in language or right sided function.  Gait remains independent.  Not requiring any decadron.  Does complain of some achy soreness in both shoulders, at muscle rather than joint.  This has been going on for over a month.  Has not started the marinol or ritalin as offerred. Denies seizures or headaches.       Decadron: 10/05/20: 12m 10/19/20: 470m2/24/22: 69m90m/04/22: -  H+P (05/10/20) Patient presented to medical attention in early August with several months history of sensory episodes.  They are described as "numbness marching down right arm also involving the leg, lasting for several minutes".  There is no post event weakness or confusion.  No known history of seizures.  In recent weeks he has also experienced some difficulty with expressive language, finding particular words and maintaining high fluency.  Since starting Keppra post-operatively he has not had any recurrence of these sensory events.  He has weaned off dexamethasone.  We are still awaiting finalized pathology results.    Medications: Current Outpatient Medications on File Prior to Visit  Medication Sig Dispense Refill  . hydrocortisone (CORTEF) 10 MG tablet Take 1 tablet (10 mg total) by mouth 2 (two) times daily. 60 tablet 1  . ibuprofen (ADVIL) 200 MG tablet Take 400 mg by mouth 2 (two) times daily.    . lMarland KitchenvETIRAcetam (KEPPRA) 500 MG tablet TAKE 1 TABLET BY MOUTH TWICE A DAY 60 tablet 1  . lisinopril (PRINIVIL) 10 MG tablet Take 3 tablets (30 mg total) by mouth daily. 60 tablet 3  . sertraline (ZOLOFT) 25 MG tablet TAKE 1 TABLET (25 MG TOTAL) BY MOUTH DAILY. 30 tablet 3  . simvastatin (ZOCOR) 40 MG tablet Take 40 mg by mouth at bedtime.    . dronabinol (MARINOL) 2.5 MG capsule Take 1 capsule (2.5 mg total) by mouth 2 (  two) times daily before a meal. (Patient not taking: No sig reported) 60 capsule 2  . methylphenidate (RITALIN) 5 MG tablet Take 1 tablet (5 mg total) by mouth 2 (two) times daily. (Patient not taking: Reported on 12/27/2020) 60 tablet 0  . ondansetron (ZOFRAN) 8 MG tablet Take 1 tablet (8 mg total) by mouth 2 (two) times daily as needed (nausea and vomiting). May take 30-60 minutes prior to Temodar administration if nausea/vomiting occurs. (Patient not taking: Reported on 12/27/2020) 30 tablet 1   No current  facility-administered medications on file prior to visit.    Allergies: No Known Allergies Past Medical History:  Past Medical History:  Diagnosis Date  . High cholesterol   . History of kidney stones    Past Surgical History:  Past Surgical History:  Procedure Laterality Date  . APPLICATION OF CRANIAL NAVIGATION Left 04/25/2020   Procedure: APPLICATION OF CRANIAL NAVIGATION;  Surgeon: Consuella Lose, MD;  Location: [facility];  Service: Neurosurgery;  Laterality: Left;  . CRANIOTOMY Left 04/25/2020   Procedure: STEREOTACTIC LEFT TEMPORAL CRANIOTOMY FOR TUMOR;  Surgeon: Consuella Lose, MD;  Location: [facility];  Service: Neurosurgery;  Laterality: Left;  . HERNIA REPAIR Left    groin    Social History:  Social History   Socioeconomic History  . Marital status: Married    Spouse name: Not on file  . Number of children: Not on file  . Years of education: Not on file  . Highest education level: Not on file  Occupational History  . Not on file  Tobacco Use  . Smoking status: Never Smoker  . Smokeless tobacco: Never Used  Substance and Sexual Activity  . Alcohol use: Yes    Alcohol/week: 6.0 standard drinks    Types: 3 Glasses of wine, 3 Cans of beer per week  . Drug use: Not Currently  . Sexual activity: Not on file  Other Topics Concern  . Not on file  Social History Narrative  . Not on file   Social Determinants of Health   Financial Resource Strain: Not on file  Food Insecurity: Not on file  Transportation Needs: Not on file  Physical Activity: Not on file  Stress: Not on file  Social Connections: Not on file  Intimate Partner Violence: Not on file   Family History: No family history on file.  Review of Systems: Constitutional: Doesn't report fevers, chills or abnormal weight loss Eyes: Doesn't report blurriness of vision Ears, nose, mouth, throat, and face: Doesn't report sore throat Respiratory: Doesn't report cough, dyspnea or wheezes Cardiovascular: Doesn't  report palpitation, chest discomfort  Gastrointestinal:  Doesn't report nausea, constipation, diarrhea GU: Doesn't report incontinence Skin: Doesn't report skin rashes Neurological: Per HPI Musculoskeletal: Doesn't report joint pain Behavioral/Psych: Doesn't report anxiety  Physical Exam: Vitals:   12/27/20 1038  BP: (!) 144/104  Pulse: 71  Resp: 17  Temp: (!) 96.6 F (35.9 C)  SpO2: 98%   KPS: 70. General: cushingoid appearance Head: Normal EENT: No conjunctival injection or scleral icterus.  Lungs: Resp effort normal Cardiac: Regular rate Abdomen: Non-distended abdomen Skin: No rashes cyanosis or petechiae. Extremities: No clubbing or edema  Neurologic Exam: Mental Status: Awake, alert, attentive to examiner. Oriented to self and environment. Language c/w transcortical expressive dyshpasia Cranial Nerves: Visual acuity is grossly normal. Right field hemianopia. Extra-ocular movements intact. No ptosis. Face is symmetric Motor: Tone and bulk are normal. 4+/5 in right arm and leg. Reflexes are symmetric, no pathologic reflexes present.  Sensory: Intact  to light touch Gait: Dystaxic, hemiparetic  Labs: I have reviewed the data as listed    Component Value Date/Time   NA 143 12/14/2020 0831   K 3.7 12/14/2020 0831   CL 111 12/14/2020 0831   CO2 22 12/14/2020 0831   GLUCOSE 118 (H) 12/14/2020 0831   BUN 13 12/14/2020 0831   CREATININE 0.86 12/14/2020 0831   CREATININE 1.07 09/17/2020 0823   CALCIUM 9.2 12/14/2020 0831   PROT 7.0 12/14/2020 0831   ALBUMIN 3.8 12/14/2020 0831   AST 22 12/14/2020 0831   AST 24 09/17/2020 0823   ALT 31 12/14/2020 0831   ALT 50 (H) 09/17/2020 0823   ALKPHOS 57 12/14/2020 0831   BILITOT 0.9 12/14/2020 0831   BILITOT 1.2 09/17/2020 0823   GFRNONAA >60 12/14/2020 0831   GFRNONAA >60 09/17/2020 0823   GFRAA >60 06/14/2020 0828   Lab Results  Component Value Date   WBC 4.8 12/27/2020   NEUTROABS 3.4 12/27/2020   HGB 13.7 12/27/2020    HCT 41.1 12/27/2020   MCV 92.4 12/27/2020   PLT 190 12/27/2020     Assessment/Plan Glioblastoma, IDH-wildtype ([place]) [C71.9]  Christopher Tucker is clinically stable today.  Labs are within normal limits for Avastin today.    He should decrease hydrocortisone to 75m daily if tolerated.   We recommended continuing treatment with Avastin 12mkg IV q2 weeks.    Avastin should be held for the following:  ANC less than 1500  Platelets less than 50,000  LFT or creatinine greater than 2x ULN  If clinical concerns/contraindications develop  Will increase Lisinopril to 4033maily due to refractory hypertension; he will also make visit with his PCP for further titration.  Will continue zoloft 63m52mily.  We ask that MarkAdael Culbreathurn to clinic in 2 weeks for next avastin treatment; MRI will be scheduled for 02/01/21.  All questions were answered. The patient knows to call the clinic with any problems, questions or concerns. No barriers to learning were detected.  I have spent a total of 30 minutes of face-to-face and non-face-to-face time, excluding clinical staff time, preparing to see patient, ordering tests and/or medications, counseling the patient, and independently interpreting results and communicating results to the patient/family/caregiver    ZachVentura Sellers Medical Director of Neuro-Oncology [facility]aukena07/22 10:47 AM

## 2020-12-27 NOTE — Patient Instructions (Signed)
[facility] Discharge Instructions for Patients Receiving Chemotherapy  Today you received the following chemotherapy agents Avastin  To help prevent nausea and vomiting after your treatment, we encourage you to take your nausea medication as directed   If you develop nausea and vomiting that is not controlled by your nausea medication, call the clinic.   BELOW ARE SYMPTOMS THAT SHOULD BE REPORTED IMMEDIATELY:  *FEVER GREATER THAN 100.5 F  *CHILLS WITH OR WITHOUT FEVER  NAUSEA AND VOMITING THAT IS NOT CONTROLLED WITH YOUR NAUSEA MEDICATION  *UNUSUAL SHORTNESS OF BREATH  *UNUSUAL BRUISING OR BLEEDING  TENDERNESS IN MOUTH AND THROAT WITH OR WITHOUT PRESENCE OF ULCERS  *URINARY PROBLEMS  *BOWEL PROBLEMS  UNUSUAL RASH Items with * indicate a potential emergency and should be followed up as soon as possible.  Feel free to call the clinic should you have any questions or concerns. The clinic phone number is [phone].  Please show the CHEMO ALERT CARD at check-in to the Emergency Department and triage nurse.   

## 2020-12-27 NOTE — Progress Notes (Signed)
12/27/2020 per Dr [name] ok to proceed with treatment today.  Aware of blood pressure reading.  Adjustments are being considered on his lisinopril.

## 2020-12-31 ENCOUNTER — Telehealth: Payer: Self-pay | Admitting: *Deleted

## 2020-12-31 NOTE — Telephone Encounter (Signed)
Ritalin Prior Authorization submitted via [email or web address] on 12/27/2020 EOC number: [id number] has been closed.  Claim does not require a prior authorization at this time.  No further action needed.  Override is in effect until 12/25/2021.

## 2021-01-04 ENCOUNTER — Other Ambulatory Visit: Payer: Self-pay

## 2021-01-06 ENCOUNTER — Other Ambulatory Visit: Payer: Self-pay | Admitting: Internal Medicine

## 2021-01-07 ENCOUNTER — Ambulatory Visit: Payer: 59 | Admitting: Physical Therapy

## 2021-01-08 ENCOUNTER — Encounter: Payer: Self-pay | Admitting: Physical Therapy

## 2021-01-08 ENCOUNTER — Other Ambulatory Visit: Payer: Self-pay

## 2021-01-08 ENCOUNTER — Ambulatory Visit: Payer: 59 | Attending: Internal Medicine | Admitting: Physical Therapy

## 2021-01-08 VITALS — BP 116/88 | HR 98

## 2021-01-08 DIAGNOSIS — R2681 Unsteadiness on feet: Secondary | ICD-10-CM | POA: Diagnosis not present

## 2021-01-08 DIAGNOSIS — M6281 Muscle weakness (generalized): Secondary | ICD-10-CM

## 2021-01-08 DIAGNOSIS — R2689 Other abnormalities of gait and mobility: Secondary | ICD-10-CM | POA: Insufficient documentation

## 2021-01-08 NOTE — Therapy (Signed)
[facility] [address] [place], [place], [address] Phone: [phone]   Fax:  [phone]  Physical Therapy Treatment  Patient Details  Name: Christopher Tucker MRN: [id number] Date of Birth: 03/27/1961 Referring Provider (PT): [name], MD   Encounter Date: 01/08/2021   PT End of Session - 01/08/21 1718    Visit Number 8    Number of Visits 11    Date for PT Re-Evaluation 03/10/21   POC for 12 weeks, pt taking a 4 week break with therapy   Authorization Type Montez Hageman 2022 (Oct 23 2020 - Oct 22 2021)    PT Start Time 1623   therapist running late   PT Stop Time 1704    PT Time Calculation (min) 41 min    Equipment Utilized During Treatment Gait belt    Activity Tolerance Patient tolerated treatment well    Behavior During Therapy WFL for tasks assessed/performed           Past Medical History:  Diagnosis Date  . High cholesterol   . History of kidney stones     Past Surgical History:  Procedure Laterality Date  . APPLICATION OF CRANIAL NAVIGATION Left 04/25/2020   Procedure: APPLICATION OF CRANIAL NAVIGATION;  Surgeon: Consuella Lose, MD;  Location: [facility];  Service: Neurosurgery;  Laterality: Left;  . CRANIOTOMY Left 04/25/2020   Procedure: STEREOTACTIC LEFT TEMPORAL CRANIOTOMY FOR TUMOR;  Surgeon: Consuella Lose, MD;  Location: [facility];  Service: Neurosurgery;  Laterality: Left;  . HERNIA REPAIR Left    groin     Vitals:   01/08/21 1630 01/08/21 1649  BP: (!) 132/95 116/88  Pulse: 89 98     Subjective Assessment - 01/08/21 1626    Subjective Has been doing the balance exercises. Walking and balance is feeling the same. BP was elevated a couple weeks ago, was given more BP medication.    Patient is accompained by: Family member    Pertinent History left temporal anaplastic astrocytoma (s/p Stereotactic left temporal craniotomy for biopsy of L temporal lobe tumor on 04/25/20)    Limitations  Walking;House hold activities;Other (comment)    Patient Stated Goals improve balance and walking and become more active    Currently in Pain? No/denies                                  Balance Exercises - 01/08/21 1648      Balance Exercises: Standing   Standing Eyes Closed Narrow base of support (BOS);Foam/compliant surface;Limitations    Standing Eyes Closed Limitations 2 x 30 seconds feet together on blue air ex, x10 reps head turns, x10 reps head nods    SLS Eyes open;Foam/compliant surface    SLS Limitations standing on blue air ex; alternating toe taps to cones x15 reps B, cues for gentle toe tap    Step Ups Forward;Limitations;4 inch;Intermittent UE support    Step Ups Limitations on blue air ex on 4" step, x10 reps each leg floating non stance leg with intermittent UE support, min guard for balance    Tandem Gait Forward;Intermittent upper extremity support;4 reps    Tandem Gait Limitations on blue foam beam cues for slowed and controlled    Step Over Hurdles / Cones stepping over 3 larger orange hurdles and one smaller one on blue compliant mat, down and back x3 reps, then adding in reciprocal UE arm lift down and back  x3 reps (pt with incr difficulty coordinating)             PT Education - 01/08/21 1716    Education Details POC going forward (1x week for 4 weeks), reviewed purpose on HEP and areas to be addressed in therapy sessions.    Person(s) Educated Patient;Spouse    Methods Explanation    Comprehension Verbalized understanding            PT Short Term Goals - 12/10/20 1654      PT SHORT TERM GOAL #1   Title ALL STGS = LTGS             PT Long Term Goals - 12/10/20 1654      PT LONG TERM GOAL #1   Title Pt will be independent with final HEP in order to build upon functional gains made in therapy. ALL LTGS DUE 02/04/21    Baseline reviewed and updated HEP on 12/10/20, pt would benefit from future additions    Time 8   due to  delay in scheduling   Period Weeks    Status On-going    Target Date 02/04/21      PT LONG TERM GOAL #2   Title Pt will improve FGA score to at least 27/30 in order to demo decr fall risk.    Baseline 21/30, 25/30 on 12/10/20    Time 8    Period Weeks    Status Revised      PT LONG TERM GOAL #3   Title Will perform 30 second chair stand to assess for functional strength with LTG written as appropriate.    Time 8    Period Weeks    Status New      PT LONG TERM GOAL #4   Title Pt will ambulate at least 500' over outdoor paved/grass surfaces with supervision with no AD vs. LRAD in order to demo improved community mobility    Baseline not assessed today    Time 8    Period Weeks    Status On-going                 Plan - 01/08/21 1720    Clinical Impression Statement Pt's BP WFL for therapy today. Today's skilled session focused on balance on compliant surfaces with vision removed and with SLS. Pt with incr difficulty coordinating reciprocal UE/LE movements. Pt tolerated session well. Will continue to progress towards LTGs.    Personal Factors and Comorbidities Comorbidity 1    Comorbidities Glioblastoma Grade IV    Examination-Activity Limitations Stand;Transfers;Stairs;Locomotion Level    Examination-Participation Restrictions Community Activity;Occupation;Driving    Stability/Clinical Decision Making Evolving/Moderate complexity    Rehab Potential Good    PT Frequency 1x / week    PT Duration 4 weeks    PT Treatment/Interventions ADLs/Self Care Home Management;DME Instruction;Gait training;Stair training;Therapeutic activities;Functional mobility training;Therapeutic exercise;Balance training;Neuromuscular re-education;Patient/family education;Vestibular;Energy conservation;Orthotic Fit/Training    PT Next Visit Plan how is HEP? balance on compliant surfaces; SLS, vision removed. functional BLE strength    PT Home Exercise Plan [id number]    Consulted and Agree with Plan of  Care Patient           Patient will benefit from skilled therapeutic intervention in order to improve the following deficits and impairments:  Abnormal gait,Decreased balance,Decreased activity tolerance,Difficulty walking,Decreased strength  Visit Diagnosis: Unsteadiness on feet  Muscle weakness (generalized)  Other abnormalities of gait and mobility     Problem List Patient Active Problem List  Diagnosis Date Noted  . 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 Brain tumor ([facility]) 04/25/2020  . HYPERLIPIDEMIA 07/31/2008  . SCIATICA, ACUTE 07/31/2008    [name], PT, DPT  01/08/2021, 5:21 PM  [facility] [address] [place], [place], [address] Phone: [phone]   Fax:  [phone]  Name: Christopher Tucker MRN: [id number] Date of Birth: 02-15-1961

## 2021-01-09 ENCOUNTER — Encounter: Payer: Self-pay | Admitting: Internal Medicine

## 2021-01-10 ENCOUNTER — Inpatient Hospital Stay: Payer: 59

## 2021-01-10 ENCOUNTER — Inpatient Hospital Stay (HOSPITAL_BASED_OUTPATIENT_CLINIC_OR_DEPARTMENT_OTHER): Payer: 59 | Admitting: Internal Medicine

## 2021-01-10 ENCOUNTER — Other Ambulatory Visit: Payer: Self-pay

## 2021-01-10 VITALS — BP 125/84 | HR 74

## 2021-01-10 VITALS — BP 140/96 | HR 70 | Temp 96.0°F | Resp 18 | Wt 198.4 lb

## 2021-01-10 DIAGNOSIS — C719 Malignant neoplasm of brain, unspecified: Secondary | ICD-10-CM | POA: Diagnosis not present

## 2021-01-10 DIAGNOSIS — Z5112 Encounter for antineoplastic immunotherapy: Secondary | ICD-10-CM | POA: Diagnosis not present

## 2021-01-10 LAB — COMPREHENSIVE METABOLIC PANEL
ALT: 34 U/L (ref 0–44)
AST: 26 U/L (ref 15–41)
Albumin: 4.1 g/dL (ref 3.5–5.0)
Alkaline Phosphatase: 58 U/L (ref 38–126)
Anion gap: 10 (ref 5–15)
BUN: 24 mg/dL — ABNORMAL HIGH (ref 6–20)
CO2: 24 mmol/L (ref 22–32)
Calcium: 9.5 mg/dL (ref 8.9–10.3)
Chloride: 108 mmol/L (ref 98–111)
Creatinine, Ser: 0.89 mg/dL (ref 0.61–1.24)
GFR, Estimated: >60 mL/min (ref >60)
Glucose, Bld: 96 mg/dL (ref 70–99)
Potassium: 4.3 mmol/L (ref 3.5–5.1)
Sodium: 142 mmol/L (ref 135–145)
Total Bilirubin: 1.1 mg/dL (ref 0.3–1.2)
Total Protein: 7.1 g/dL (ref 6.5–8.1)

## 2021-01-10 LAB — CBC WITH DIFFERENTIAL/PLATELET
Abs Immature Granulocytes: 0.01 10*3/uL (ref 0.00–0.07)
Basophils Absolute: 0 10*3/uL (ref 0.0–0.1)
Basophils Relative: 1 %
Eosinophils Absolute: 0.1 10*3/uL (ref 0.0–0.5)
Eosinophils Relative: 2 %
HCT: 44.4 % (ref 39.0–52.0)
Hemoglobin: 14.5 g/dL (ref 13.0–17.0)
Immature Granulocytes: 0 %
Lymphocytes Relative: 20 %
Lymphs Abs: 0.8 10*3/uL (ref 0.7–4.0)
MCH: 30.2 pg (ref 26.0–34.0)
MCHC: 32.7 g/dL (ref 30.0–36.0)
MCV: 92.5 fL (ref 80.0–100.0)
Monocytes Absolute: 0.6 10*3/uL (ref 0.1–1.0)
Monocytes Relative: 15 %
Neutro Abs: 2.5 10*3/uL (ref 1.7–7.7)
Neutrophils Relative %: 62 %
Platelets: 217 10*3/uL (ref 150–400)
RBC: 4.8 MIL/uL (ref 4.22–5.81)
RDW: 12.9 % (ref 11.5–15.5)
WBC: 4.1 10*3/uL (ref 4.0–10.5)
nRBC: 0 % (ref 0.0–0.2)

## 2021-01-10 LAB — TOTAL PROTEIN, URINE DIPSTICK: Protein, ur: NEGATIVE mg/dL

## 2021-01-10 MED ORDER — SODIUM CHLORIDE 0.9 % IV SOLN
10.0000 mg/kg | Freq: Once | INTRAVENOUS | Status: AC
  Administered 2021-01-10: 900 mg via INTRAVENOUS
  Filled 2021-01-10: qty 32

## 2021-01-10 MED ORDER — SODIUM CHLORIDE 0.9 % IV SOLN
Freq: Once | INTRAVENOUS | Status: AC
  Filled 2021-01-10: qty 250

## 2021-01-10 NOTE — Progress Notes (Signed)
[facility] [place], [place] [address] [phone]   Interval Evaluation  Date of Service: 01/10/21 Patient Name: Christopher Tucker Patient MRN: [id number] Patient DOB: 11/22/1960 Provider: [name], MD  Identifying Statement:  Christopher Tucker is a 60 y.o. 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 Lisbeth Renshaw   07/30/2020 -  Chemotherapy   The patient had dexamethasone (DECADRON) 4 MG tablet, 1 of 1 cycle, Start date: --, End date: -- temozolomide (TEMODAR) 5 MG capsule, 200 mg/m2/day = 410 mg, Oral, Daily, 1 of 1 cycle, Start date: --, End date: -- temozolomide (TEMODAR) 20 MG capsule, 200 mg/m2/day = 400 mg, Oral, Daily, 1 of 1 cycle, Start date: --, End date: -- temozolomide (TEMODAR) 100 MG capsule, 150 mg/m2/day = 300 mg, Oral, Daily, 1 of 1 cycle, Start date: 07/30/2020, End date: 08/04/2020 temozolomide (TEMODAR) 140 MG capsule, 200 mg/m2/day = 420 mg, Oral, Daily, 1 of 1 cycle, Start date: --, End date: --  for chemotherapy treatment.    10/18/2020 -  Chemotherapy    Patient is on Treatment Plan: BRAIN ANAPLASTIC GLIOMA GRADE III TEMOZOLOMIDE POST XRT Q28D   Patient is on Antibody Plan: BRAIN GBM BEVACIZUMAB 14D X 6 CYCLES       Biomarkers:  MGMT Unknown.  IDH 1/2 Wild type.  EGFR Unknown  TERT "Mutated   Interval History:  [name] presents today for avastin infusion.  No changes in language or right sided function.  Gait remains independent. Continues on hydrocortisone 10mg  daily.  Denies seizures or headaches.  Recently mood lability and irritability have become more problematic, leading to some dysfunction at home.      Decadron: 10/05/20: 8mg  10/19/20: 4mg  11/15/20: 1mg  11/23/20:  -  H+P (05/10/20) Patient presented to medical attention in early August with several months history of sensory episodes.  They are described as "numbness marching down right arm also involving the leg, lasting for several minutes".  There is no post event weakness or confusion.  No known history of seizures.  In recent weeks he has also experienced some difficulty with expressive language, finding particular words and maintaining high fluency.  Since starting Keppra post-operatively he has not had any recurrence of these sensory events.  He has weaned off dexamethasone.  We are still awaiting finalized pathology results.    Medications: Current Outpatient Medications on File Prior to Visit  Medication Sig Dispense Refill  . amLODipine (NORVASC) 5 MG tablet Take 5 mg by mouth at bedtime.    . hydrocortisone (CORTEF) 10 MG tablet Take 1 tablet (10 mg total) by mouth daily. 60 tablet 1  . ibuprofen (ADVIL) 200 MG tablet Take 400 mg by mouth 2 (two) times daily.    Marland Kitchen levETIRAcetam (KEPPRA) 500 MG tablet TAKE 1 TABLET BY MOUTH TWICE A DAY 60 tablet 1  . lisinopril (ZESTRIL) 20 MG tablet Take 2 tablets (40 mg total) by mouth daily. 120 tablet 3  . sertraline (ZOLOFT) 25 MG tablet TAKE 1 TABLET (25 MG TOTAL) BY MOUTH DAILY. 30 tablet 3  . simvastatin (ZOCOR) 40 MG tablet Take 40 mg by mouth at bedtime.    . dronabinol (MARINOL) 2.5 MG capsule Take 1 capsule (2.5 mg total) by mouth 2 (two) times daily before a  meal. (Patient not taking: No sig reported) 60 capsule 2  . methylphenidate (RITALIN) 5 MG tablet Take 1 tablet (5 mg total) by mouth 2 (two) times daily. (Patient not taking: No sig reported) 60 tablet 0  . ondansetron (ZOFRAN) 8 MG tablet Take 1 tablet (8 mg total) by mouth 2 (two) times daily as needed (nausea and vomiting). May take 30-60 minutes prior to Temodar administration if nausea/vomiting occurs. (Patient not taking: No sig reported) 30 tablet 1   No current facility-administered  medications on file prior to visit.    Allergies:  Allergies  Allergen Reactions  . Pravastatin     Other reaction(s): muscle aches   Past Medical History:  Past Medical History:  Diagnosis Date  . High cholesterol   . History of kidney stones    Past Surgical History:  Past Surgical History:  Procedure Laterality Date  . APPLICATION OF CRANIAL NAVIGATION Left 04/25/2020   Procedure: APPLICATION OF CRANIAL NAVIGATION;  Surgeon: Consuella Lose, MD;  Location: [facility];  Service: Neurosurgery;  Laterality: Left;  . CRANIOTOMY Left 04/25/2020   Procedure: STEREOTACTIC LEFT TEMPORAL CRANIOTOMY FOR TUMOR;  Surgeon: Consuella Lose, MD;  Location: [facility];  Service: Neurosurgery;  Laterality: Left;  . HERNIA REPAIR Left    groin    Social History:  Social History   Socioeconomic History  . Marital status: Married    Spouse name: Not on file  . Number of children: Not on file  . Years of education: Not on file  . Highest education level: Not on file  Occupational History  . Not on file  Tobacco Use  . Smoking status: Never Smoker  . Smokeless tobacco: Never Used  Substance and Sexual Activity  . Alcohol use: Yes    Alcohol/week: 6.0 standard drinks    Types: 3 Glasses of wine, 3 Cans of beer per week  . Drug use: Not Currently  . Sexual activity: Not on file  Other Topics Concern  . Not on file  Social History Narrative  . Not on file   Social Determinants of Health   Financial Resource Strain: Not on file  Food Insecurity: Not on file  Transportation Needs: Not on file  Physical Activity: Not on file  Stress: Not on file  Social Connections: Not on file  Intimate Partner Violence: Not on file   Family History: No family history on file.  Review of Systems: Constitutional: Doesn't report fevers, chills or abnormal weight loss Eyes: Doesn't report blurriness of vision Ears, nose, mouth, throat, and face: Doesn't report sore throat Respiratory: Doesn't report  cough, dyspnea or wheezes Cardiovascular: Doesn't report palpitation, chest discomfort  Gastrointestinal:  Doesn't report nausea, constipation, diarrhea GU: Doesn't report incontinence Skin: Doesn't report skin rashes Neurological: Per HPI Musculoskeletal: Doesn't report joint pain Behavioral/Psych: Doesn't report anxiety  Physical Exam: Vitals:   01/10/21 1141  BP: (!) 140/96  Pulse: 70  Resp: 18  Temp: (!) 96 F (35.6 C)  SpO2: 100%   KPS: 70. General: cushingoid appearance Head: Normal EENT: No conjunctival injection or scleral icterus.  Lungs: Resp effort normal Cardiac: Regular rate Abdomen: Non-distended abdomen Skin: No rashes cyanosis or petechiae. Extremities: No clubbing or edema  Neurologic Exam: Mental Status: Awake, alert, attentive to examiner. Oriented to self and environment. Language c/w transcortical expressive dyshpasia Cranial Nerves: Visual acuity is grossly normal. Right field hemianopia. Extra-ocular movements intact. No ptosis. Face is symmetric Motor: Tone and bulk are normal. 4+/5 in right arm and leg.  Reflexes are symmetric, no pathologic reflexes present.  Sensory: Intact to light touch Gait: Dystaxic, hemiparetic  Labs: I have reviewed the data as listed    Component Value Date/Time   NA 142 01/10/2021 1123   K 4.3 01/10/2021 1123   CL 108 01/10/2021 1123   CO2 24 01/10/2021 1123   GLUCOSE 96 01/10/2021 1123   BUN 24 (H) 01/10/2021 1123   CREATININE 0.89 01/10/2021 1123   CREATININE 1.07 09/17/2020 0823   CALCIUM 9.5 01/10/2021 1123   PROT 7.1 01/10/2021 1123   ALBUMIN 4.1 01/10/2021 1123   AST 26 01/10/2021 1123   AST 24 09/17/2020 0823   ALT 34 01/10/2021 1123   ALT 50 (H) 09/17/2020 0823   ALKPHOS 58 01/10/2021 1123   BILITOT 1.1 01/10/2021 1123   BILITOT 1.2 09/17/2020 0823   GFRNONAA >60 01/10/2021 1123   GFRNONAA >60 09/17/2020 0823   GFRAA >60 06/14/2020 0828   Lab Results  Component Value Date   WBC 4.1 01/10/2021    NEUTROABS 2.5 01/10/2021   HGB 14.5 01/10/2021   HCT 44.4 01/10/2021   MCV 92.5 01/10/2021   PLT 217 01/10/2021     Assessment/Plan Glioblastoma, IDH-wildtype ([facility]) [C71.9]  Anothy Bufano is clinically stable today.  Labs are within normal limits for Avastin today.    For mood lability, there is some concern regarding effects of Keppra.  We recommended initiation of new AED, Lamotrigine.  Should dose 26m daily x7 days, then 559mBID x7 days, then STOP keppra if tolerated.  We counseled them regarding Lamictal rash.  We recommended continuing treatment with Avastin 1018mg IV q2 weeks.    Avastin should be held for the following:  ANC less than 1500  Platelets less than 50,000  LFT or creatinine greater than 2x ULN  If clinical concerns/contraindications develop  Will con't Lisinopril 56m46mily, Norvasc 5mg 12mly.  Will continue zoloft 25mg 53my.  We ask that Regie FKeithen Capon to clinic in 2 weeks for next avastin treatment; MRI will be scheduled for 02/01/21.  All questions were answered. The patient knows to call the clinic with any problems, questions or concerns. No barriers to learning were detected.  I have spent a total of 30 minutes of face-to-face and non-face-to-face time, excluding clinical staff time, preparing to see patient, ordering tests and/or medications, counseling the patient, and independently interpreting results and communicating results to the patient/family/caregiver    ZacharVentura Sellersedical Director of Neuro-Oncology [facility] [place]/22 12:30 PM

## 2021-01-10 NOTE — Patient Instructions (Signed)
Bevacizumab injection What is this medicine? BEVACIZUMAB (be va SIZ yoo mab) is a monoclonal antibody. It is used to treat many types of cancer. This medicine may be used for other purposes; ask your health care provider or pharmacist if you have questions. COMMON BRAND NAME(S): Avastin, MVASI, Zirabev What should I tell my health care provider before I take this medicine? They need to know if you have any of these conditions:  diabetes  heart disease  high blood pressure  history of coughing up blood  prior anthracycline chemotherapy (e.g., doxorubicin, daunorubicin, epirubicin)  recent or ongoing radiation therapy  recent or planning to have surgery  stroke  an unusual or allergic reaction to bevacizumab, hamster proteins, mouse proteins, other medicines, foods, dyes, or preservatives  pregnant or trying to get pregnant  breast-feeding How should I use this medicine? This medicine is for infusion into a vein. It is given by a health care professional in a hospital or clinic setting. Talk to your pediatrician regarding the use of this medicine in children. Special care may be needed. Overdosage: If you think you have taken too much of this medicine contact a poison control center or emergency room at once. NOTE: This medicine is only for you. Do not share this medicine with others. What if I miss a dose? It is important not to miss your dose. Call your doctor or health care professional if you are unable to keep an appointment. What may interact with this medicine? Interactions are not expected. This list may not describe all possible interactions. Give your health care provider a list of all the medicines, herbs, non-prescription drugs, or dietary supplements you use. Also tell them if you smoke, drink alcohol, or use illegal drugs. Some items may interact with your medicine. What should I watch for while using this medicine? Your condition will be monitored carefully while  you are receiving this medicine. You will need important blood work and urine testing done while you are taking this medicine. This medicine may increase your risk to bruise or bleed. Call your doctor or health care professional if you notice any unusual bleeding. Before having surgery, talk to your health care provider to make sure it is ok. This drug can increase the risk of poor healing of your surgical site or wound. You will need to stop this drug for 28 days before surgery. After surgery, wait at least 28 days before restarting this drug. Make sure the surgical site or wound is healed enough before restarting this drug. Talk to your health care provider if questions. Do not become pregnant while taking this medicine or for 6 months after stopping it. Women should inform their doctor if they wish to become pregnant or think they might be pregnant. There is a potential for serious side effects to an unborn child. Talk to your health care professional or pharmacist for more information. Do not breast-feed an infant while taking this medicine and for 6 months after the last dose. This medicine has caused ovarian failure in some women. This medicine may interfere with the ability to have a child. You should talk to your doctor or health care professional if you are concerned about your fertility. What side effects may I notice from receiving this medicine? Side effects that you should report to your doctor or health care professional as soon as possible:  allergic reactions like skin rash, itching or hives, swelling of the face, lips, or tongue  chest pain or chest tightness    chills  coughing up blood  high fever  seizures  severe constipation  signs and symptoms of bleeding such as bloody or black, tarry stools; red or dark-brown urine; spitting up blood or brown material that looks like coffee grounds; red spots on the skin; unusual bruising or bleeding from the eye, gums, or nose  signs  and symptoms of a blood clot such as breathing problems; chest pain; severe, sudden headache; pain, swelling, warmth in the leg  signs and symptoms of a stroke like changes in vision; confusion; trouble speaking or understanding; severe headaches; sudden numbness or weakness of the face, arm or leg; trouble walking; dizziness; loss of balance or coordination  stomach pain  sweating  swelling of legs or ankles  vomiting  weight gain Side effects that usually do not require medical attention (report to your doctor or health care professional if they continue or are bothersome):  back pain  changes in taste  decreased appetite  dry skin  nausea  tiredness This list may not describe all possible side effects. Call your doctor for medical advice about side effects. You may report side effects to FDA at 1-800-FDA-1088. Where should I keep my medicine? This drug is given in a hospital or clinic and will not be stored at home. NOTE: This sheet is a summary. It may not cover all possible information. If you have questions about this medicine, talk to your doctor, pharmacist, or health care provider.  2021 Elsevier/Gold Standard (2019-07-06 10:50:46)  

## 2021-01-14 ENCOUNTER — Ambulatory Visit: Payer: 59 | Admitting: Physical Therapy

## 2021-01-14 ENCOUNTER — Encounter: Payer: Self-pay | Admitting: Physical Therapy

## 2021-01-14 ENCOUNTER — Other Ambulatory Visit: Payer: Self-pay | Admitting: Internal Medicine

## 2021-01-14 ENCOUNTER — Other Ambulatory Visit: Payer: Self-pay

## 2021-01-14 VITALS — BP 119/80 | HR 94

## 2021-01-14 DIAGNOSIS — R2681 Unsteadiness on feet: Secondary | ICD-10-CM

## 2021-01-14 DIAGNOSIS — R2689 Other abnormalities of gait and mobility: Secondary | ICD-10-CM

## 2021-01-14 DIAGNOSIS — M6281 Muscle weakness (generalized): Secondary | ICD-10-CM

## 2021-01-14 MED ORDER — LAMOTRIGINE 25 MG PO TABS: ORAL_TABLET | ORAL | 1 refills | Status: DC

## 2021-01-14 MED ORDER — GABAPENTIN 300 MG PO CAPS: 300.0000 mg | ORAL_CAPSULE | Freq: Two times a day (BID) | ORAL | 3 refills | Status: AC

## 2021-01-14 NOTE — Therapy (Signed)
[facility] [address] [place], [place], [address] Phone: [phone]   Fax:  [phone]  Physical Therapy Treatment  Patient Details  Name: Christopher Tucker MRN: [id number] Date of Birth: 1960/11/12 Referring Provider (PT): [name], MD   Encounter Date: 01/14/2021   PT End of Session - 01/14/21 1620    Visit Number 9    Number of Visits 11    Date for PT Re-Evaluation 03/10/21   POC for 12 weeks, pt taking a 4 week break with therapy   Authorization Type Montez Hageman 2022 (Oct 23 2020 - Oct 22 2021)    PT Start Time 1231    PT Stop Time 1312    PT Time Calculation (min) 41 min    Equipment Utilized During Treatment Gait belt    Activity Tolerance Patient tolerated treatment well    Behavior During Therapy WFL for tasks assessed/performed           Past Medical History:  Diagnosis Date  . High cholesterol   . History of kidney stones     Past Surgical History:  Procedure Laterality Date  . APPLICATION OF CRANIAL NAVIGATION Left 04/25/2020   Procedure: APPLICATION OF CRANIAL NAVIGATION;  Surgeon: Consuella Lose, MD;  Location: [facility];  Service: Neurosurgery;  Laterality: Left;  . CRANIOTOMY Left 04/25/2020   Procedure: STEREOTACTIC LEFT TEMPORAL CRANIOTOMY FOR TUMOR;  Surgeon: Consuella Lose, MD;  Location: [facility];  Service: Neurosurgery;  Laterality: Left;  . HERNIA REPAIR Left    groin     Vitals:   01/14/21 1236  BP: 119/80  Pulse: 94     Subjective Assessment - 01/14/21 1233    Subjective Saw Dr. [name] the other day. Might be changing his keppra medication.    Patient is accompained by: Family member    Pertinent History left temporal anaplastic astrocytoma (s/p Stereotactic left temporal craniotomy for biopsy of L temporal lobe tumor on 04/25/20)    Limitations Walking;House hold activities;Other (comment)    Patient Stated Goals improve balance and walking and become more active    Currently  in Pain? Yes    Pain Score 3     Pain Location Shoulder    Pain Orientation Right    Pain Descriptors / Indicators Numbness    Pain Type Acute pain    Aggravating Factors  raising his arm up    Pain Relieving Factors medication, nothing really.                                  Balance Exercises - 01/14/21 1259      Balance Exercises: Standing   Standing Eyes Opened Head turns;Foam/compliant surface;Limitations    Standing Eyes Opened Limitations on rockerboard in M/L direction: x10 reps head turns, x10 reps head nods    Standing Eyes Closed Foam/compliant surface;Limitations    Standing Eyes Closed Limitations on rockerboard in M/L direction: 3 x 20-30 seconds with intermittent taps to bar for balance, with leaning over to R at times    SLS with Vectors Foam/compliant surface;Limitations    SLS with Vectors Limitations standing on rockerboard in A/P direction, alternating toe taps to cone x10 reps, then stepping onto rockerboard with single leg and tapping cone with contralateral leg x10 reps - incr challenge by putting 1" foam on rockerboard    Tandem Gait Forward;Retro;Intermittent upper extremity support;3 reps;Limitations    Tandem Gait Limitations down  and back 3 reps on blue mat    Other Standing Exercises on black side of BOSU: weight shifting (clockwise and counter clockwise) x10 reps, mini squats with UE support x5 reps, mini lunges x5 reps B with each foot on blue side of BOSU               PT Short Term Goals - 12/10/20 1654      PT SHORT TERM GOAL #1   Title ALL STGS = LTGS             PT Long Term Goals - 12/10/20 1654      PT LONG TERM GOAL #1   Title Pt will be independent with final HEP in order to build upon functional gains made in therapy. ALL LTGS DUE 02/04/21    Baseline reviewed and updated HEP on 12/10/20, pt would benefit from future additions    Time 8   due to delay in scheduling   Period Weeks    Status On-going     Target Date 02/04/21      PT LONG TERM GOAL #2   Title Pt will improve FGA score to at least 27/30 in order to demo decr fall risk.    Baseline 21/30, 25/30 on 12/10/20    Time 8    Period Weeks    Status Revised      PT LONG TERM GOAL #3   Title Will perform 30 second chair stand to assess for functional strength with LTG written as appropriate.    Time 8    Period Weeks    Status New      PT LONG TERM GOAL #4   Title Pt will ambulate at least 500' over outdoor paved/grass surfaces with supervision with no AD vs. LRAD in order to demo improved community mobility    Baseline not assessed today    Time 8    Period Weeks    Status On-going                 Plan - 01/14/21 1621    Clinical Impression Statement Today's skilled session continued to focus on balance strategies with vision removed, compliant surfaces, and SLS. When closing eyes on rockerboard, pt with tendency to lose his balance to the R. Pt improves with incr reps when performing SLS activities, esp with RLE. Will continue to progress towards LTGs.    Personal Factors and Comorbidities Comorbidity 1    Comorbidities Glioblastoma Grade IV    Examination-Activity Limitations Stand;Transfers;Stairs;Locomotion Level    Examination-Participation Restrictions Community Activity;Occupation;Driving    Stability/Clinical Decision Making Evolving/Moderate complexity    Rehab Potential Good    PT Frequency 1x / week    PT Duration 4 weeks    PT Treatment/Interventions ADLs/Self Care Home Management;DME Instruction;Gait training;Stair training;Therapeutic activities;Functional mobility training;Therapeutic exercise;Balance training;Neuromuscular re-education;Patient/family education;Vestibular;Energy conservation;Orthotic Fit/Training    PT Next Visit Plan how is HEP? balance on compliant surfaces; SLS, vision removed. functional BLE strength    PT Home Exercise Plan [email or web address]    Consulted and Agree with Plan of Care Patient            Patient will benefit from skilled therapeutic intervention in order to improve the following deficits and impairments:  Abnormal gait,Decreased balance,Decreased activity tolerance,Difficulty walking,Decreased strength  Visit Diagnosis: Unsteadiness on feet  Muscle weakness (generalized)  Other abnormalities of gait and mobility     Problem List Patient Active Problem List   Diagnosis Date Noted  . 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 Brain tumor ([facility]) 04/25/2020  . HYPERLIPIDEMIA 07/31/2008  . SCIATICA, ACUTE 07/31/2008    [name], PT, DPT  01/14/2021, 4:22 PM  [facility] [address] [place], [place], [address] Phone: [phone]   Fax:  [phone]  Name: Deward Sebek MRN: [id number] Date of Birth: [birth year]

## 2021-01-17 ENCOUNTER — Inpatient Hospital Stay (HOSPITAL_BASED_OUTPATIENT_CLINIC_OR_DEPARTMENT_OTHER): Payer: 59 | Admitting: Internal Medicine

## 2021-01-17 DIAGNOSIS — C719 Malignant neoplasm of brain, unspecified: Secondary | ICD-10-CM

## 2021-01-17 NOTE — Progress Notes (Signed)
I connected with Christopher Tucker on 01/17/21 at 11:00 AM EDT by telephone visit and verified that I am speaking with the correct person using two identifiers.  I discussed the limitations, risks, security and privacy concerns of performing an evaluation and management service by telemedicine and the availability of in-person appointments. I also discussed with the patient that there may be a patient responsible charge related to this service. The patient expressed understanding and agreed to proceed.  Other persons participating in the visit and their role in the encounter:  spouse  Patient's location:  Home  Provider's location:  Office  Chief Complaint:  Glioblastoma, IDH-wildtype ([place] [facility])  History of Present Ilness: Christopher Tucker describes ongoing soreness, pain affecting his left arm.  This has been going on for 2-3 months, gradually worsening to the point it is interfering with sleep.  This is not clearly associated with the joint, not worsened by positioning, not lancinating or radiating in particular fashion.  There is no involvement of the neck.  Also describes numbness of the left foot, but that is more longstanding and overall stable from prior.  Observations: Language noted for dysphasia Assessment and Plan: Glioblastoma, IDH-wildtype ([place])  Timing of the shoulder/arm issue, which is most consistent with myalgai/arthralgia, arouses suspicion regarding the Lisinopril.  Pain has escalated concurrently with increasing dose level.  He may continue Gabapentin, PRN Tylenol for symptom relief for now.  In meantime, he should increase amlodipine to 83m daily, and HOLD Lisinopril through the weekend.  He will check BP on his own during the weekend, and call uKoreaif diastolic rises above 1111NB  (We can add Metoprolol or other agent).       Will continue Lamictal titration and Keppra taper as discussed.  Follow Up Instructions:  Follow up next week as scheduled  I discussed the assessment and treatment plan  with the patient.  The patient was provided an opportunity to ask questions and all were answered.  The patient agreed with the plan and demonstrated understanding of the instructions.    The patient was advised to call back or seek an in-person evaluation if the symptoms worsen or if the condition fails to improve as anticipated.  I provided 5-10 minutes of non-face-to-face time during this enocunter.  ZVentura Sellers MD   I provided 15 minutes of non face-to-face telephone visit time during this encounter, and > 50% was spent counseling as documented under my assessment & plan.

## 2021-01-21 ENCOUNTER — Ambulatory Visit: Payer: 59 | Attending: Internal Medicine | Admitting: Physical Therapy

## 2021-01-21 ENCOUNTER — Encounter: Payer: Self-pay | Admitting: Physical Therapy

## 2021-01-21 ENCOUNTER — Other Ambulatory Visit: Payer: Self-pay

## 2021-01-21 VITALS — BP 123/94 | HR 86

## 2021-01-21 DIAGNOSIS — R2689 Other abnormalities of gait and mobility: Secondary | ICD-10-CM | POA: Diagnosis present

## 2021-01-21 DIAGNOSIS — R2681 Unsteadiness on feet: Secondary | ICD-10-CM | POA: Diagnosis present

## 2021-01-21 DIAGNOSIS — M6281 Muscle weakness (generalized): Secondary | ICD-10-CM | POA: Diagnosis present

## 2021-01-21 NOTE — Therapy (Signed)
[facility] [address] [place], [place], [address] Phone: [phone]   Fax:  [phone]  Physical Therapy Treatment  Patient Details  Name: Christopher Tucker MRN: [id number] Date of Birth: 05-10-1961 Referring Provider (PT): [name], MD   Encounter Date: 01/21/2021   PT End of Session - 01/21/21 1315    Visit Number 10    Number of Visits 11    Date for PT Re-Evaluation 03/10/21   POC for 12 weeks, pt taking a 4 week break with therapy   Authorization Type Montez Hageman 2022 (Oct 23 2020 - Oct 22 2021)    PT Start Time 1231    PT Stop Time 1312    PT Time Calculation (min) 41 min    Equipment Utilized During Treatment Gait belt    Activity Tolerance Patient tolerated treatment well    Behavior During Therapy WFL for tasks assessed/performed           Past Medical History:  Diagnosis Date  . High cholesterol   . History of kidney stones     Past Surgical History:  Procedure Laterality Date  . APPLICATION OF CRANIAL NAVIGATION Left 04/25/2020   Procedure: APPLICATION OF CRANIAL NAVIGATION;  Surgeon: Consuella Lose, MD;  Location: [facility];  Service: Neurosurgery;  Laterality: Left;  . CRANIOTOMY Left 04/25/2020   Procedure: STEREOTACTIC LEFT TEMPORAL CRANIOTOMY FOR TUMOR;  Surgeon: Consuella Lose, MD;  Location: [facility];  Service: Neurosurgery;  Laterality: Left;  . HERNIA REPAIR Left    groin     Vitals:   01/21/21 1235 01/21/21 1253 01/21/21 1259  BP: (!) 141/99 (!) 144/100 (!) 123/94  Pulse: 87 91 86     Subjective Assessment - 01/21/21 1233    Subjective Dr. [name] changed some of his medication, his R arm is feeling better today. Entire R side was feeling sore over the weekend. Felt a little better on Sunday. Walked 3 miles this morning.    Patient is accompained by: Family member    Pertinent History left temporal anaplastic astrocytoma (s/p Stereotactic left temporal craniotomy for biopsy of L  temporal lobe tumor on 04/25/20)    Limitations Walking;House hold activities;Other (comment)    Patient Stated Goals improve balance and walking and become more active    Currently in Pain? --   "just a little bit of aching in R shoulder if presses on it"                                 Balance Exercises - 01/21/21 1252      Balance Exercises: Standing   Standing Eyes Closed Narrow base of support (BOS);Foam/compliant surface    Standing Eyes Closed Limitations feet together static 2 x 30 seconds, narrow BOS 2 x 10 reps head turns, 2 x 10 reps head nods    Rockerboard Lateral;EC    Rockerboard Limitations 3 x 30 seconds, intermittent touch to the bars for balance in M/lL direction   Partial Tandem Stance Eyes open;2 reps;Limitations    Partial Tandem Stance Limitations x2 reps each side, x10 reps head turns and x10 reps head nods - eyes open    Other Standing Exercises on blue air ex; wide BOS lateral weight shifting x5 reps B with eyes closed             PT Education - 01/21/21 1313    Education Details monitoring BP, bringing BP  log to visit with Dr. [name] on thursday/making him aware if BP (diastolic value equal to > than 157mg at home) before appt    Person(s) Educated Patient    Methods Explanation    Comprehension Verbalized understanding            PT Short Term Goals - 12/10/20 1654      PT SHORT TERM GOAL #1   Title ALL STGS = LTGS             PT Long Term Goals - 12/10/20 1654      PT LONG TERM GOAL #1   Title Pt will be independent with final HEP in order to build upon functional gains made in therapy. ALL LTGS DUE 02/04/21    Baseline reviewed and updated HEP on 12/10/20, pt would benefit from future additions    Time 8   due to delay in scheduling   Period Weeks    Status On-going    Target Date 02/04/21      PT LONG TERM GOAL #2   Title Pt will improve FGA score to at least 27/30 in order to demo decr fall risk.    Baseline  21/30, 25/30 on 12/10/20    Time 8    Period Weeks    Status Revised      PT LONG TERM GOAL #3   Title Will perform 30 second chair stand to assess for functional strength with LTG written as appropriate.    Time 8    Period Weeks    Status New      PT LONG TERM GOAL #4   Title Pt will ambulate at least 500' over outdoor paved/grass surfaces with supervision with no AD vs. LRAD in order to demo improved community mobility    Baseline not assessed today    Time 8    Period Weeks    Status On-going                 Plan - 01/21/21 1624    Clinical Impression Statement Pt's BP meds recently changed from Dr. VMickeal Skinner Diastolic BP elevated at start of session, after prolonged seated rest breaks, decreased to the mid 90s. Frequent seated rest breaks taken throughout session. Pt asymptomatic throughout. Focused on balance strategies on compliant surfaces with vision removed. Will continue to progress towards LTGs.    Personal Factors and Comorbidities Comorbidity 1    Comorbidities Glioblastoma Grade IV    Examination-Activity Limitations Stand;Transfers;Stairs;Locomotion Level    Examination-Participation Restrictions Community Activity;Occupation;Driving    Stability/Clinical Decision Making Evolving/Moderate complexity    Rehab Potential Good    PT Frequency 1x / week    PT Duration 4 weeks    PT Treatment/Interventions ADLs/Self Care Home Management;DME Instruction;Gait training;Stair training;Therapeutic activities;Functional mobility training;Therapeutic exercise;Balance training;Neuromuscular re-education;Patient/family education;Vestibular;Energy conservation;Orthotic Fit/Training    PT Next Visit Plan check goals, determine D/C vs. recert. how is pt's BP? how is HEP? balance on compliant surfaces; SLS, vision removed. functional BLE strength    PT Home Exercise Plan [id number]   Consulted and Agree with Plan of Care Patient           Patient will benefit from skilled  therapeutic intervention in order to improve the following deficits and impairments:  Abnormal gait,Decreased balance,Decreased activity tolerance,Difficulty walking,Decreased strength  Visit Diagnosis: Unsteadiness on feet  Muscle weakness (generalized)  Other abnormalities of gait and mobility     Problem List Patient Active Problem List   Diagnosis Date Noted  .  Glioblastoma, IDH-wildtype ([facility]) 05/14/2020  . Brain tumor ([facility]) 04/25/2020  . HYPERLIPIDEMIA 07/31/2008  . SCIATICA, ACUTE 07/31/2008    Lillia Pauls, DPT  01/21/2021, 4:26 PM  [facility] [address] [place], [place], [address] Phone: [phone]   Fax:  [phone]  Name: Mateen Franssen MRN: [id number] Date of Birth: 21-Feb-1961

## 2021-01-24 ENCOUNTER — Encounter: Payer: Self-pay | Admitting: Internal Medicine

## 2021-01-24 ENCOUNTER — Other Ambulatory Visit: Payer: Self-pay

## 2021-01-24 ENCOUNTER — Inpatient Hospital Stay: Payer: 59

## 2021-01-24 ENCOUNTER — Inpatient Hospital Stay (HOSPITAL_BASED_OUTPATIENT_CLINIC_OR_DEPARTMENT_OTHER): Payer: 59 | Admitting: Internal Medicine

## 2021-01-24 ENCOUNTER — Inpatient Hospital Stay: Payer: 59 | Attending: Neurosurgery

## 2021-01-24 ENCOUNTER — Other Ambulatory Visit: Payer: Self-pay | Admitting: Internal Medicine

## 2021-01-24 VITALS — BP 142/97 | HR 83 | Temp 97.6°F | Resp 17 | Ht 71.0 in | Wt 202.6 lb

## 2021-01-24 VITALS — BP 125/85 | HR 80

## 2021-01-24 DIAGNOSIS — C719 Malignant neoplasm of brain, unspecified: Secondary | ICD-10-CM | POA: Insufficient documentation

## 2021-01-24 DIAGNOSIS — Z923 Personal history of irradiation: Secondary | ICD-10-CM | POA: Insufficient documentation

## 2021-01-24 DIAGNOSIS — Z7952 Long term (current) use of systemic steroids: Secondary | ICD-10-CM | POA: Diagnosis not present

## 2021-01-24 DIAGNOSIS — Z79899 Other long term (current) drug therapy: Secondary | ICD-10-CM | POA: Insufficient documentation

## 2021-01-24 DIAGNOSIS — Z5112 Encounter for antineoplastic immunotherapy: Secondary | ICD-10-CM | POA: Diagnosis present

## 2021-01-24 DIAGNOSIS — I10 Essential (primary) hypertension: Secondary | ICD-10-CM | POA: Diagnosis not present

## 2021-01-24 DIAGNOSIS — Z87442 Personal history of urinary calculi: Secondary | ICD-10-CM | POA: Diagnosis not present

## 2021-01-24 LAB — CBC WITH DIFFERENTIAL/PLATELET
Abs Immature Granulocytes: 0.01 10*3/uL (ref 0.00–0.07)
Basophils Absolute: 0 10*3/uL (ref 0.0–0.1)
Basophils Relative: 1 %
Eosinophils Absolute: 0 10*3/uL (ref 0.0–0.5)
Eosinophils Relative: 1 %
HCT: 42 % (ref 39.0–52.0)
Hemoglobin: 14.1 g/dL (ref 13.0–17.0)
Immature Granulocytes: 0 %
Lymphocytes Relative: 14 %
Lymphs Abs: 0.6 10*3/uL — ABNORMAL LOW (ref 0.7–4.0)
MCH: 29.9 pg (ref 26.0–34.0)
MCHC: 33.6 g/dL (ref 30.0–36.0)
MCV: 89.2 fL (ref 80.0–100.0)
Monocytes Absolute: 0.5 10*3/uL (ref 0.1–1.0)
Monocytes Relative: 11 %
Neutro Abs: 3.5 10*3/uL (ref 1.7–7.7)
Neutrophils Relative %: 73 %
Platelets: 180 10*3/uL (ref 150–400)
RBC: 4.71 MIL/uL (ref 4.22–5.81)
RDW: 12.8 % (ref 11.5–15.5)
WBC: 4.7 10*3/uL (ref 4.0–10.5)
nRBC: 0 % (ref 0.0–0.2)

## 2021-01-24 LAB — COMPREHENSIVE METABOLIC PANEL
ALT: 43 U/L (ref 0–44)
AST: 32 U/L (ref 15–41)
Albumin: 4.1 g/dL (ref 3.5–5.0)
Alkaline Phosphatase: 61 U/L (ref 38–126)
Anion gap: 9 (ref 5–15)
BUN: 17 mg/dL (ref 6–20)
CO2: 24 mmol/L (ref 22–32)
Calcium: 9.5 mg/dL (ref 8.9–10.3)
Chloride: 108 mmol/L (ref 98–111)
Creatinine, Ser: 0.86 mg/dL (ref 0.61–1.24)
GFR, Estimated: >60 mL/min (ref >60)
Glucose, Bld: 104 mg/dL — ABNORMAL HIGH (ref 70–99)
Potassium: 4.1 mmol/L (ref 3.5–5.1)
Sodium: 141 mmol/L (ref 135–145)
Total Bilirubin: 0.8 mg/dL (ref 0.3–1.2)
Total Protein: 7.4 g/dL (ref 6.5–8.1)

## 2021-01-24 LAB — TOTAL PROTEIN, URINE DIPSTICK: Protein, ur: 30 mg/dL — AB

## 2021-01-24 MED ORDER — SODIUM CHLORIDE 0.9 % IV SOLN
10.0000 mg/kg | Freq: Once | INTRAVENOUS | Status: AC
  Administered 2021-01-24: 900 mg via INTRAVENOUS
  Filled 2021-01-24: qty 32

## 2021-01-24 MED ORDER — SODIUM CHLORIDE 0.9 % IV SOLN
Freq: Once | INTRAVENOUS | Status: AC
  Filled 2021-01-24: qty 250

## 2021-01-24 NOTE — Telephone Encounter (Signed)
Rx refill request

## 2021-01-24 NOTE — Patient Instructions (Signed)
[facility]  Discharge Instructions: Thank you for choosing [facility] to provide your oncology and hematology care.   If you have a lab appointment with the Cancer Center, please go directly to the Cancer Center and check in at the registration area.   Wear comfortable clothing and clothing appropriate for easy access to any Portacath or PICC line.   We strive to give you quality time with your provider. You may need to reschedule your appointment if you arrive late (15 or more minutes).  Arriving late affects you and other patients whose appointments are after yours.  Also, if you miss three or more appointments without notifying the office, you may be dismissed from the clinic at the provider's discretion.      For prescription refill requests, have your pharmacy contact our office and allow 72 hours for refills to be completed.    Today you received the following chemotherapy and/or immunotherapy agent: Bevacizumab   To help prevent nausea and vomiting after your treatment, we encourage you to take your nausea medication as directed.  BELOW ARE SYMPTOMS THAT SHOULD BE REPORTED IMMEDIATELY: *FEVER GREATER THAN 100.4 F (38 C) OR HIGHER *CHILLS OR SWEATING *NAUSEA AND VOMITING THAT IS NOT CONTROLLED WITH YOUR NAUSEA MEDICATION *UNUSUAL SHORTNESS OF BREATH *UNUSUAL BRUISING OR BLEEDING *URINARY PROBLEMS (pain or burning when urinating, or frequent urination) *BOWEL PROBLEMS (unusual diarrhea, constipation, pain near the anus) TENDERNESS IN MOUTH AND THROAT WITH OR WITHOUT PRESENCE OF ULCERS (sore throat, sores in mouth, or a toothache) UNUSUAL RASH, SWELLING OR PAIN  UNUSUAL VAGINAL DISCHARGE OR ITCHING   Items with * indicate a potential emergency and should be followed up as soon as possible or go to the Emergency Department if any problems should occur.  Please show the CHEMOTHERAPY ALERT CARD or IMMUNOTHERAPY ALERT CARD at check-in to  the Emergency Department and triage nurse.  Should you have questions after your visit or need to cancel or reschedule your appointment, please contact [facility]  Dept: [phone]  and follow the prompts.  Office hours are 8:00 a.m. to 4:30 p.m. Monday - Friday. Please note that voicemails left after 4:00 p.m. may not be returned until the following business day.  We are closed weekends and major holidays. You have access to a nurse at all times for urgent questions. Please call the main number to the clinic Dept: [phone] and follow the prompts.   For any non-urgent questions, you may also contact your provider using MyChart. We now offer e-Visits for anyone 18 and older to request care online for non-urgent symptoms. For details visit [email or web address].   Also download the MyChart app! Go to the app store, search "MyChart", open the app, select [facility], and log in with your MyChart username and password.  Due to Covid, a mask is required upon entering the hospital/clinic. If you do not have a mask, one will be given to you upon arrival. For doctor visits, patients may have 1 support person aged 18 or older with them. For treatment visits, patients cannot have anyone with them due to current Covid guidelines and our immunocompromised population.   

## 2021-01-24 NOTE — Progress Notes (Signed)
[facility] [place], [place] [address] [phone]   Interval Evaluation  Date of Service: 01/24/21 Patient Name: Christopher Tucker Patient MRN: [id number] Patient DOB: 09-29-1960 Provider: [name], MD  Identifying Statement:  Christopher Tucker is a 60 y.o. male with left temporal anaplastic astrocytoma   Oncologic History: Oncology History  Glioblastoma, IDH-wildtype ([place])  05/14/2020 Initial Diagnosis   Anaplastic astrocytoma, IDH-wildtype ([place])   05/21/2020 - 07/02/2020 Radiation Therapy   IMRT and concurrent Temodar with Dr. Lisbeth Renshaw   07/30/2020 -  Chemotherapy   The patient had dexamethasone (DECADRON) 4 MG tablet, 1 of 1 cycle, Start date: --, End date: -- temozolomide (TEMODAR) 5 MG capsule, 200 mg/m2/day = 410 mg, Oral, Daily, 1 of 1 cycle, Start date: --, End date: -- temozolomide (TEMODAR) 20 MG capsule, 200 mg/m2/day = 400 mg, Oral, Daily, 1 of 1 cycle, Start date: --, End date: -- temozolomide (TEMODAR) 100 MG capsule, 150 mg/m2/day = 300 mg, Oral, Daily, 1 of 1 cycle, Start date: 07/30/2020, End date: 08/04/2020 temozolomide (TEMODAR) 140 MG capsule, 200 mg/m2/day = 420 mg, Oral, Daily, 1 of 1 cycle, Start date: --, End date: --  for chemotherapy treatment.    10/18/2020 -  Chemotherapy    Patient is on Treatment Plan: BRAIN ANAPLASTIC GLIOMA GRADE III TEMOZOLOMIDE POST XRT Q28D   Patient is on Antibody Plan: BRAIN GBM BEVACIZUMAB 14D X 6 CYCLES       Biomarkers:  MGMT Unknown.  IDH 1/2 Wild type.  EGFR Unknown  TERT "Mutated   Interval History:  Christopher Tucker presents today for avastin infusion.  No changes in language or right sided function.  Gait remains independent.  Denies seizures or headaches.  He is now dosing Lamictal 66m twice per day, as well as keppra 5091mtwice per day.  Maybe modest improvements in irritability.  Right arm and leg muscle aches might be somewhat improved since stopping Lisinopril,  it's difficult to tell as only been a few days.    Decadron: 10/05/20: 63m47m/28/22: 4mg79m24/22: 1mg 76m4/22: -  H+P (05/10/20) Patient presented to medical attention in early August with several months history of sensory episodes.  They are described as "numbness marching down right arm also involving the leg, lasting for several minutes".  There is no post event weakness or confusion.  No known history of seizures.  In recent weeks he has also experienced some difficulty with expressive language, finding particular words and maintaining high fluency.  Since starting Keppra post-operatively he has not had any recurrence of these sensory events.  He has weaned off dexamethasone.  We are still awaiting finalized pathology results.    Medications: Current Outpatient Medications on File Prior to Visit  Medication Sig Dispense Refill  . amLODipine (NORVASC) 5 MG tablet Take 5 mg by mouth at bedtime.    . dronabinol (MARINOL) 2.5 MG capsule Take 1 capsule (2.5 mg total) by mouth 2 (two) times daily before a meal. (Patient not taking: No sig reported) 60 capsule 2  . gabapentin (NEURONTIN) 300 MG capsule Take 1 capsule (300 mg total) by mouth 2 (two) times daily. 60 capsule 3  . hydrocortisone (CORTEF) 10 MG tablet Take 1 tablet (10 mg total) by mouth daily. 60 tablet 1  . ibuprofen (ADVIL) 200 MG tablet Take 400 mg by mouth 2 (two) times daily.    . lamMarland KitchenTRIgine (LAMICTAL) 25 MG tablet Take 50mg 75mM x7 days, then increase  to 62m twice per day 120 tablet 1  . levETIRAcetam (KEPPRA) 500 MG tablet TAKE 1 TABLET BY MOUTH TWICE A DAY 60 tablet 1  . lisinopril (ZESTRIL) 20 MG tablet Take 2 tablets (40 mg total) by mouth daily. 120 tablet 3  . methylphenidate (RITALIN) 5 MG tablet Take 1 tablet (5 mg total) by mouth 2 (two) times daily. (Patient not taking: No sig reported) 60 tablet 0  . ondansetron (ZOFRAN) 8 MG tablet Take 1 tablet (8 mg total) by mouth 2 (two) times daily as needed (nausea and  vomiting). May take 30-60 minutes prior to Temodar administration if nausea/vomiting occurs. (Patient not taking: No sig reported) 30 tablet 1  . sertraline (ZOLOFT) 25 MG tablet TAKE 1 TABLET (25 MG TOTAL) BY MOUTH DAILY. 30 tablet 3  . simvastatin (ZOCOR) 40 MG tablet Take 40 mg by mouth at bedtime.     No current facility-administered medications on file prior to visit.    Allergies:  Allergies  Allergen Reactions  . Pravastatin     Other reaction(s): muscle aches   Past Medical History:  Past Medical History:  Diagnosis Date  . High cholesterol   . History of kidney stones    Past Surgical History:  Past Surgical History:  Procedure Laterality Date  . APPLICATION OF CRANIAL NAVIGATION Left 04/25/2020   Procedure: APPLICATION OF CRANIAL NAVIGATION;  Surgeon: NConsuella Lose MD;  [facility]  Service: Neurosurgery;  Laterality: Left;  . CRANIOTOMY Left 04/25/2020   Procedure: STEREOTACTIC LEFT TEMPORAL CRANIOTOMY FOR TUMOR;  Surgeon: NConsuella Lose MD;  [facility]  Service: Neurosurgery;  Laterality: Left;  . HERNIA REPAIR Left    groin    Social History:  Social History   Socioeconomic History  . Marital status: Married    Spouse name: Not on file  . Number of children: Not on file  . Years of education: Not on file  . Highest education level: Not on file  Occupational History  . Not on file  Tobacco Use  . Smoking status: Never Smoker  . Smokeless tobacco: Never Used  Substance and Sexual Activity  . Alcohol use: Yes    Alcohol/week: 6.0 standard drinks    Types: 3 Glasses of wine, 3 Cans of beer per week  . Drug use: Not Currently  . Sexual activity: Not on file  Other Topics Concern  . Not on file  Social History Narrative  . Not on file   Social Determinants of Health   Financial Resource Strain: Not on file  Food Insecurity: Not on file  Transportation Needs: Not on file  Physical Activity: Not on file  Stress: Not on file  Social  Connections: Not on file  Intimate Partner Violence: Not on file   Family History: No family history on file.  Review of Systems: Constitutional: Doesn't report fevers, chills or abnormal weight loss Eyes: Doesn't report blurriness of vision Ears, nose, mouth, throat, and face: Doesn't report sore throat Respiratory: Doesn't report cough, dyspnea or wheezes Cardiovascular: Doesn't report palpitation, chest discomfort  Gastrointestinal:  Doesn't report nausea, constipation, diarrhea GU: Doesn't report incontinence Skin: Doesn't report skin rashes Neurological: Per HPI Musculoskeletal: Doesn't report joint pain Behavioral/Psych: Doesn't report anxiety  Physical Exam: Vitals:   01/24/21 1233  BP: (!) 142/97  Pulse: 83  Resp: 17  Temp: 97.6 F (36.4 C)  SpO2: 100%   KPS: 70. General: cushingoid appearance Head: Normal EENT: No conjunctival injection or scleral icterus.  Lungs: Resp  effort normal Cardiac: Regular rate Abdomen: Non-distended abdomen Skin: No rashes cyanosis or petechiae. Extremities: No clubbing or edema  Neurologic Exam: Mental Status: Awake, alert, attentive to examiner. Oriented to self and environment. Language c/w transcortical expressive dyshpasia Cranial Nerves: Visual acuity is grossly normal. Right field hemianopia. Extra-ocular movements intact. No ptosis. Face is symmetric Motor: Tone and bulk are normal. 4+/5 in right arm and leg. Reflexes are symmetric, no pathologic reflexes present.  Sensory: Intact to light touch Gait: Dystaxic, hemiparetic  Labs: I have reviewed the data as listed    Component Value Date/Time   NA 142 01/10/2021 1123   K 4.3 01/10/2021 1123   CL 108 01/10/2021 1123   CO2 24 01/10/2021 1123   GLUCOSE 96 01/10/2021 1123   BUN 24 (H) 01/10/2021 1123   CREATININE 0.89 01/10/2021 1123   CREATININE 1.07 09/17/2020 0823   CALCIUM 9.5 01/10/2021 1123   PROT 7.1 01/10/2021 1123   ALBUMIN 4.1 01/10/2021 1123   AST 26  01/10/2021 1123   AST 24 09/17/2020 0823   ALT 34 01/10/2021 1123   ALT 50 (H) 09/17/2020 0823   ALKPHOS 58 01/10/2021 1123   BILITOT 1.1 01/10/2021 1123   BILITOT 1.2 09/17/2020 0823   GFRNONAA >60 01/10/2021 1123   GFRNONAA >60 09/17/2020 0823   GFRAA >60 06/14/2020 0828   Lab Results  Component Value Date   WBC 4.7 01/24/2021   NEUTROABS 3.5 01/24/2021   HGB 14.1 01/24/2021   HCT 42.0 01/24/2021   MCV 89.2 01/24/2021   PLT 180 01/24/2021     Assessment/Plan Glioblastoma, IDH-wildtype ([place]) [C71.9]  Socrates Cahoon is clinically stable today.  Labs are within normal limits for Avastin today.    We recommended continuing treatment with Avastin 21m/kg IV q2 weeks.    Avastin should be held for the following:  ANC less than 1500  Platelets less than 50,000  LFT or creatinine greater than 2x ULN  If clinical concerns/contraindications develop  May discontinue Keppra once he is on Lamictal 1029mdaily for 7 days.  Will con't norvasc 1038maily, continue to hold Lisinopril.  Will continue zoloft 22m51mily.  We ask that MarkLebron Nauerturn to clinic in 2 weeks for next avastin treatment; MRI will be scheduled for 02/01/21.  All questions were answered. The patient knows to call the clinic with any problems, questions or concerns. No barriers to learning were detected.  I have spent a total of 30 minutes of face-to-face and non-face-to-face time, excluding clinical staff time, preparing to see patient, ordering tests and/or medications, counseling the patient, and independently interpreting results and communicating results to the patient/family/caregiver    ZachVentura Sellers Medical Director of Neuro-Oncology [facility] PetersburgWeslLyndon05/22 12:25 PM

## 2021-01-28 ENCOUNTER — Encounter: Payer: Self-pay | Admitting: Internal Medicine

## 2021-01-28 ENCOUNTER — Other Ambulatory Visit: Payer: Self-pay | Admitting: Radiation Therapy

## 2021-01-28 ENCOUNTER — Ambulatory Visit: Payer: 59 | Admitting: Physical Therapy

## 2021-02-01 ENCOUNTER — Encounter: Payer: Self-pay | Admitting: Internal Medicine

## 2021-02-01 ENCOUNTER — Ambulatory Visit (HOSPITAL_COMMUNITY)
Admission: RE | Admit: 2021-02-01 | Discharge: 2021-02-01 | Disposition: A | Discharge status: Home / Self Care | Payer: 59 | Source: Ambulatory Visit | Attending: Internal Medicine | Admitting: Internal Medicine

## 2021-02-01 ENCOUNTER — Other Ambulatory Visit: Payer: Self-pay

## 2021-02-01 DIAGNOSIS — C719 Malignant neoplasm of brain, unspecified: Secondary | ICD-10-CM | POA: Insufficient documentation

## 2021-02-01 IMAGING — MR MR HEAD WO/W CM
15 series · 48 of 48 positions shown · IV contrast (gadavist)
Comparison: [DATE] and [DATE]

CLINICAL DATA: Follow-up glioblastoma.

EXAM:
MRI HEAD WITHOUT AND WITH CONTRAST
TECHNIQUE: Multiplanar, multiecho pulse sequences of the brain and surrounding
structures were obtained without and with intravenous contrast.
CONTRAST:  10mL GADAVIST GADOBUTROL 1 MMOL/ML IV SOLN

[Series 5: DWI · axial · 3.0mm · 1.36mm/px · z∈[-57,+83]mm · 6 of 96 slices shown (1 of 2)]
[im 1/96]
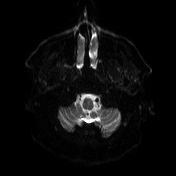
[im 20/96]
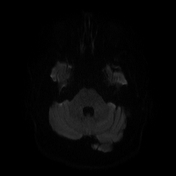
[im 39/96]
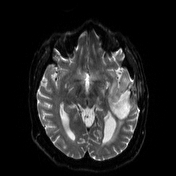
[im 58/96]
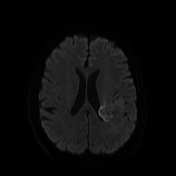
[im 77/96]
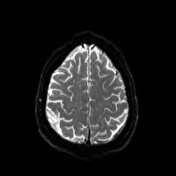
[im 96/96]
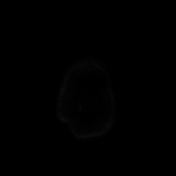

[Series 6: DWI · axial · 3.0mm · 1.36mm/px · z∈[-57,+83]mm · 3 of 48 slices shown (2 of 2)]
[im 1/48]
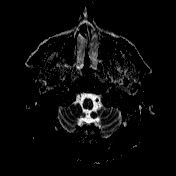
[im 24/48]
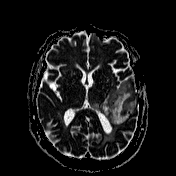
[im 48/48]
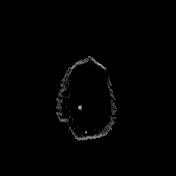

[Series 7: T1 · sagittal · 5.0mm · 0.75mm/px · 1 of 24 slices shown (1 of 2)]
[im 1/24]
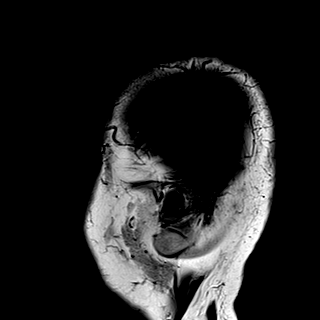

[Series 8: T2 · axial · 5.0mm · 0.62mm/px · 1 of 26 slices shown]
[im 1/26]
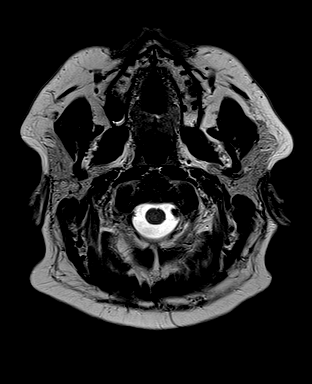

[Series 10: swi_images · axial · 3.0mm · 0.75mm/px · z∈[-93,+118]mm · 4 of 72 slices shown]
[im 1/72]
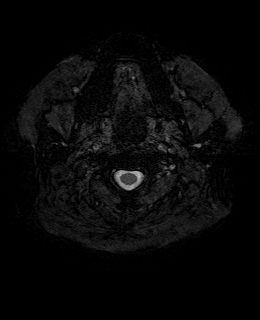
[im 24/72]
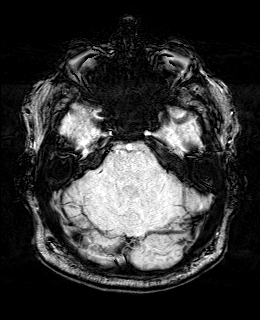
[im 48/72]
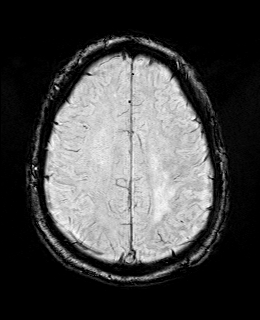
[im 72/72]
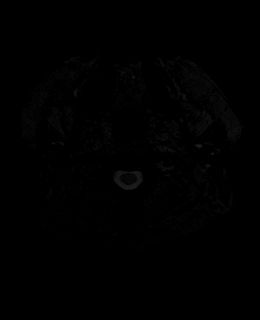

[Series 11: FLAIR · axial · 3.0mm · 0.75mm/px · z∈[-64,+89]mm · 3 of 52 slices shown]
[im 1/52]
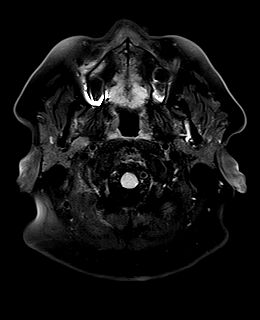
[im 26/52]
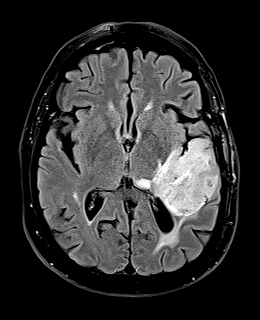
[im 52/52]
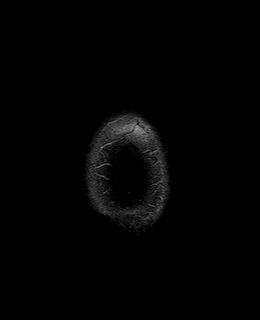

[Series 12: T1 · axial · 1.0mm · 0.94mm/px · z∈[-59,+84]mm · 8 of 144 slices shown (2 of 2)]
[im 1/144]
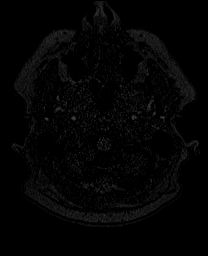
[im 21/144]
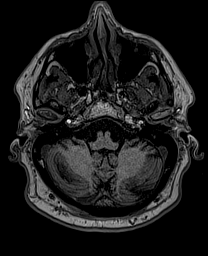
[im 41/144]
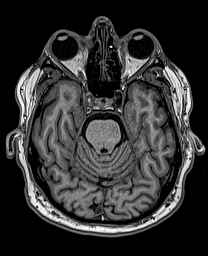
[im 62/144]
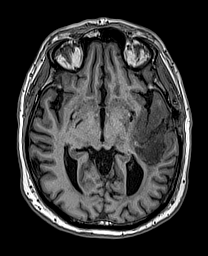
[im 82/144]
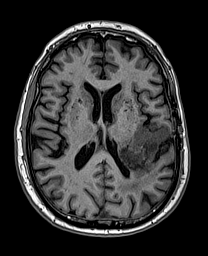
[im 103/144]
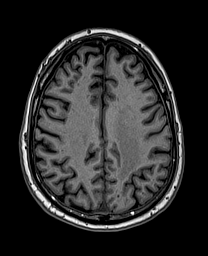
[im 123/144]
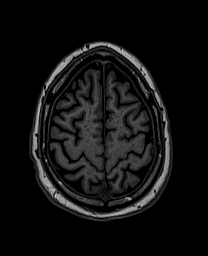
[im 144/144]
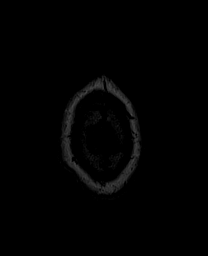

[Series 13: cor dwi_tracew · coronal · 5.0mm · 1.53mm/px · 3 of 60 slices shown]
[im 1/60]
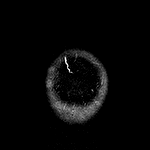
[im 30/60]
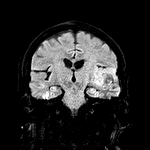
[im 60/60]
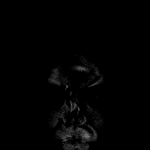

[Series 14: cor dwi_adc · coronal · 5.0mm · 1.53mm/px · 2 of 30 slices shown]
[im 1/30]
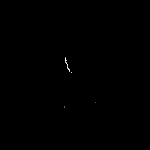
[im 30/30]
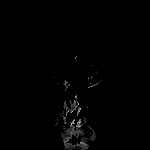

[Series 15: resolve dwi_tracew · axial · 5.0mm · 1.67mm/px · z∈[-67,+88]mm · 3 of 50 slices shown]
[im 1/50]
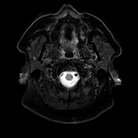
[im 25/50]
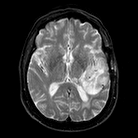
[im 50/50]
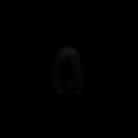

[Series 16: resolve dwi_adc · axial · 5.0mm · 1.67mm/px · 1 of 25 slices shown]
[im 1/25]
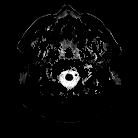

[Series 17: T2 post-contrast · coronal · 5.0mm · 0.57mm/px · 2 of 32 slices shown]
[im 1/32]
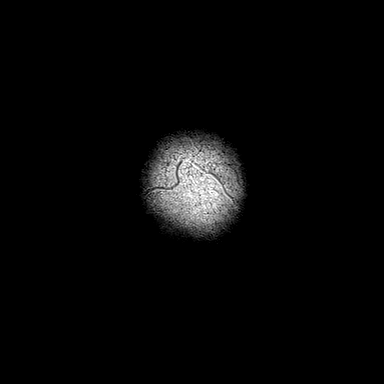
[im 32/32]
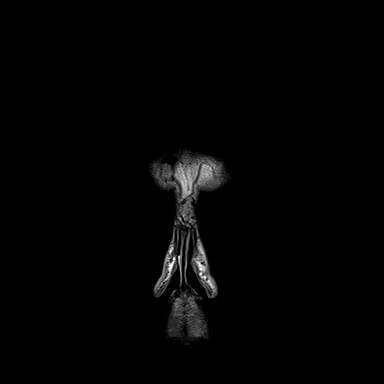

[Series 18: T1 post-contrast · axial · 1.0mm · 0.94mm/px · z∈[-59,+84]mm · 8 of 144 slices shown (1 of 3)]
[im 1/144]
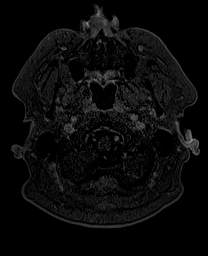
[im 21/144]
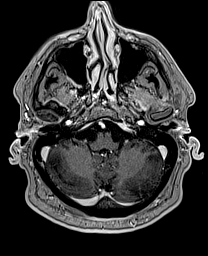
[im 41/144]
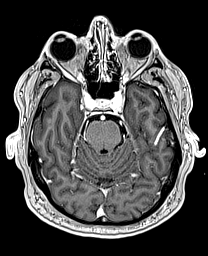
[im 62/144]
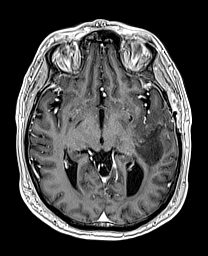
[im 82/144]
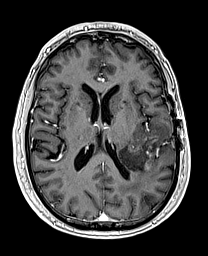
[im 103/144]
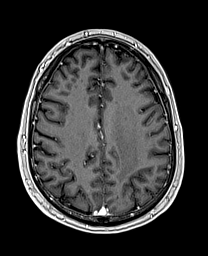
[im 123/144]
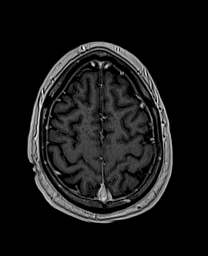
[im 144/144]
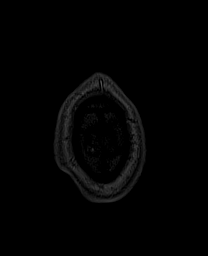

[Series 19: T1 post-contrast · coronal · 5.0mm · 0.43mm/px · 2 of 32 slices shown (2 of 3)]
[im 1/32]
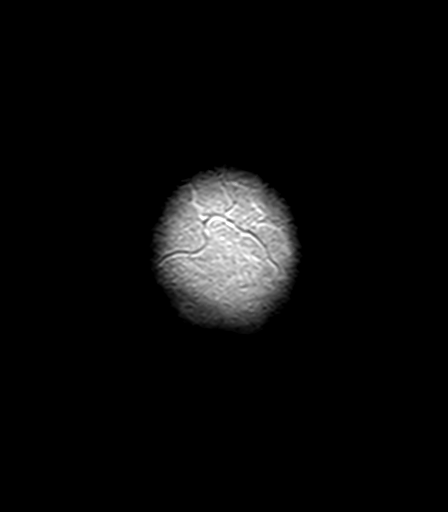
[im 32/32]
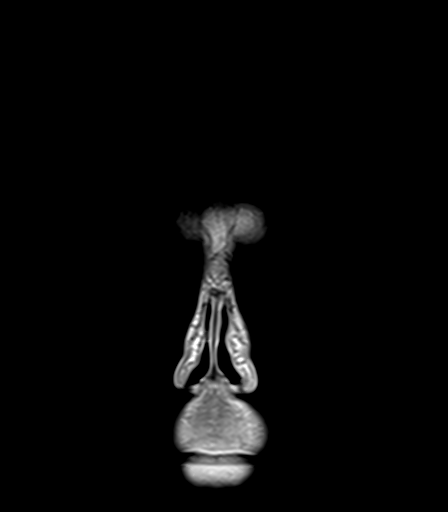

[Series 20: T1 post-contrast · sagittal · 5.0mm · 0.75mm/px · 1 of 24 slices shown (3 of 3)]
[im 1/24]
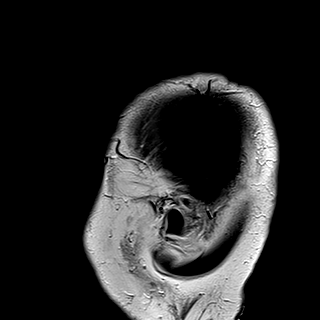

[48 of 48 positions shown; findings below may reference images not displayed]

FINDINGS: Brain: The large centrally necrotic mass with chronic blood products
in the superior left temporal lobe corresponding to known
glioblastoma has not significantly changed in overall size from the
most recent prior study. Peripheral enhancement has mildly increased
from the most recent prior study including a 1.2 cm nodular focus
medially (series 18, image 69), however the overall degree of
enhancement remains less than on the [DATE] study. Nonenhancing
T2 hyperintensity in the dorsal left thalamus has slightly increased
(series 11, image 26) but also remains less than on [DATE].
Nonenhancing T2 hyperintensity in the white matter surrounding the
mass elsewhere has not significantly changed. Regional mass effect
is unchanged. There is no midline shift.

No acute infarct or extra-axial fluid collection is present.
Scattered small foci of T2 hyperintensity in the cerebral white
matter bilaterally are similar to the prior study and are
nonspecific but compatible with mild chronic small vessel ischemic
disease. The ventricles are unchanged in size.

Vascular: Major intracranial vascular flow voids are preserved.

Skull and upper cervical spine: Left-sided craniotomy. Unremarkable
bone marrow signal para

Sinuses/Orbits: Unremarkable orbits. Paranasal sinuses and mastoid
air cells are clear.

Other: None.
IMPRESSION: Mild interval increase of peripheral enhancement of the left
temporal glioblastoma and slightly increased nonenhancing T2 signal
abnormality in the left thalamus, although both remain improved
compared to [DATE]. Findings may reflect slightly increased
tumor or treatment effects.

## 2021-02-01 MED ORDER — GADOBUTROL 1 MMOL/ML IV SOLN
10.0000 mL | Freq: Once | INTRAVENOUS | Status: AC | PRN
  Administered 2021-02-01: 10 mL via INTRAVENOUS

## 2021-02-04 ENCOUNTER — Other Ambulatory Visit: Payer: Self-pay | Admitting: Internal Medicine

## 2021-02-04 DIAGNOSIS — C719 Malignant neoplasm of brain, unspecified: Secondary | ICD-10-CM

## 2021-02-05 ENCOUNTER — Other Ambulatory Visit: Payer: Self-pay | Admitting: *Deleted

## 2021-02-05 DIAGNOSIS — C719 Malignant neoplasm of brain, unspecified: Secondary | ICD-10-CM

## 2021-02-07 ENCOUNTER — Inpatient Hospital Stay (HOSPITAL_BASED_OUTPATIENT_CLINIC_OR_DEPARTMENT_OTHER): Payer: 59 | Admitting: Internal Medicine

## 2021-02-07 ENCOUNTER — Ambulatory Visit (HOSPITAL_BASED_OUTPATIENT_CLINIC_OR_DEPARTMENT_OTHER)
Admission: RE | Admit: 2021-02-07 | Discharge: 2021-02-07 | Disposition: A | Discharge status: Home / Self Care | Payer: 59 | Source: Ambulatory Visit | Attending: Internal Medicine | Admitting: Internal Medicine

## 2021-02-07 ENCOUNTER — Inpatient Hospital Stay: Payer: 59

## 2021-02-07 ENCOUNTER — Other Ambulatory Visit: Payer: Self-pay

## 2021-02-07 ENCOUNTER — Other Ambulatory Visit: Payer: 59

## 2021-02-07 ENCOUNTER — Inpatient Hospital Stay: Payer: 59 | Admitting: Internal Medicine

## 2021-02-07 VITALS — BP 152/96 | HR 91 | Temp 97.9°F | Resp 17 | Ht 71.0 in | Wt 201.5 lb

## 2021-02-07 DIAGNOSIS — C719 Malignant neoplasm of brain, unspecified: Secondary | ICD-10-CM

## 2021-02-07 DIAGNOSIS — Z5112 Encounter for antineoplastic immunotherapy: Secondary | ICD-10-CM | POA: Diagnosis not present

## 2021-02-07 DIAGNOSIS — M79605 Pain in left leg: Secondary | ICD-10-CM

## 2021-02-07 DIAGNOSIS — M7989 Other specified soft tissue disorders: Secondary | ICD-10-CM | POA: Diagnosis not present

## 2021-02-07 DIAGNOSIS — M79604 Pain in right leg: Secondary | ICD-10-CM

## 2021-02-07 LAB — CBC WITH DIFFERENTIAL/PLATELET
Abs Immature Granulocytes: 0.01 10*3/uL (ref 0.00–0.07)
Basophils Absolute: 0 10*3/uL (ref 0.0–0.1)
Basophils Relative: 1 %
Eosinophils Absolute: 0.1 10*3/uL (ref 0.0–0.5)
Eosinophils Relative: 1 %
HCT: 42 % (ref 39.0–52.0)
Hemoglobin: 14.9 g/dL (ref 13.0–17.0)
Immature Granulocytes: 0 %
Lymphocytes Relative: 20 %
Lymphs Abs: 0.9 10*3/uL (ref 0.7–4.0)
MCH: 30.2 pg (ref 26.0–34.0)
MCHC: 35.5 g/dL (ref 30.0–36.0)
MCV: 85.2 fL (ref 80.0–100.0)
Monocytes Absolute: 0.6 10*3/uL (ref 0.1–1.0)
Monocytes Relative: 13 %
Neutro Abs: 2.8 10*3/uL (ref 1.7–7.7)
Neutrophils Relative %: 65 %
Platelets: 144 10*3/uL — ABNORMAL LOW (ref 150–400)
RBC: 4.93 MIL/uL (ref 4.22–5.81)
RDW: 12.9 % (ref 11.5–15.5)
WBC: 4.4 10*3/uL (ref 4.0–10.5)
nRBC: 0 % (ref 0.0–0.2)

## 2021-02-07 LAB — COMPREHENSIVE METABOLIC PANEL
ALT: 58 U/L — ABNORMAL HIGH (ref 0–44)
AST: 36 U/L (ref 15–41)
Albumin: 4.1 g/dL (ref 3.5–5.0)
Alkaline Phosphatase: 70 U/L (ref 38–126)
Anion gap: 11 (ref 5–15)
BUN: 17 mg/dL (ref 6–20)
CO2: 24 mmol/L (ref 22–32)
Calcium: 9.6 mg/dL (ref 8.9–10.3)
Chloride: 107 mmol/L (ref 98–111)
Creatinine, Ser: 0.89 mg/dL (ref 0.61–1.24)
GFR, Estimated: >60 mL/min (ref >60)
Glucose, Bld: 120 mg/dL — ABNORMAL HIGH (ref 70–99)
Potassium: 3.8 mmol/L (ref 3.5–5.1)
Sodium: 142 mmol/L (ref 135–145)
Total Bilirubin: 0.9 mg/dL (ref 0.3–1.2)
Total Protein: 7.5 g/dL (ref 6.5–8.1)

## 2021-02-07 LAB — TOTAL PROTEIN, URINE DIPSTICK: Protein, ur: 30 mg/dL — AB

## 2021-02-07 MED ORDER — SODIUM CHLORIDE 0.9 % IV SOLN
Freq: Once | INTRAVENOUS | Status: AC
  Filled 2021-02-07: qty 250

## 2021-02-07 MED ORDER — METOPROLOL TARTRATE 50 MG PO TABS: 50.0000 mg | ORAL_TABLET | Freq: Two times a day (BID) | ORAL | 3 refills | Status: DC

## 2021-02-07 MED ORDER — SODIUM CHLORIDE 0.9 % IV SOLN
10.0000 mg/kg | Freq: Once | INTRAVENOUS | Status: AC
  Administered 2021-02-07: 900 mg via INTRAVENOUS
  Filled 2021-02-07: qty 4

## 2021-02-07 MED ORDER — DIVALPROEX SODIUM 500 MG PO DR TAB: 1000.0000 mg | DELAYED_RELEASE_TABLET | Freq: Two times a day (BID) | ORAL | 3 refills | Status: DC

## 2021-02-07 NOTE — Progress Notes (Signed)
[facility] [place], [place] [address] [phone]   Interval Evaluation  Date of Service: 02/07/21 Patient Name: Ransome Helwig Patient MRN: [id number] Patient DOB: December 12, 1960 Provider: [name], MD  Identifying Statement:  Bay Wayson is a 60 y.o. male with left temporal anaplastic astrocytoma   Oncologic History: Oncology History  Glioblastoma, IDH-wildtype ([employer])  05/14/2020 Initial Diagnosis   Anaplastic astrocytoma, IDH-wildtype ([place])   05/21/2020 - 07/02/2020 Radiation Therapy   IMRT and concurrent Temodar with Dr. Lisbeth Renshaw   07/30/2020 -  Chemotherapy   The patient had dexamethasone (DECADRON) 4 MG tablet, 1 of 1 cycle, Start date: --, End date: -- temozolomide (TEMODAR) 5 MG capsule, 200 mg/m2/day = 410 mg, Oral, Daily, 1 of 1 cycle, Start date: --, End date: -- temozolomide (TEMODAR) 20 MG capsule, 200 mg/m2/day = 400 mg, Oral, Daily, 1 of 1 cycle, Start date: --, End date: -- temozolomide (TEMODAR) 100 MG capsule, 150 mg/m2/day = 300 mg, Oral, Daily, 1 of 1 cycle, Start date: 07/30/2020, End date: 08/04/2020 temozolomide (TEMODAR) 140 MG capsule, 200 mg/m2/day = 420 mg, Oral, Daily, 1 of 1 cycle, Start date: --, End date: --  for chemotherapy treatment.    10/18/2020 -  Chemotherapy    Patient is on Treatment Plan: BRAIN ANAPLASTIC GLIOMA GRADE III TEMOZOLOMIDE POST XRT Q28D   Patient is on Antibody Plan: BRAIN GBM BEVACIZUMAB 14D X 6 CYCLES       Biomarkers:  MGMT Unknown.  IDH 1/2 Wild type.  EGFR Unknown  TERT "Mutated   Interval History:  Myrick Mcnairy presents today following recent MRI brain.  Today he describes decline in speech output and finding words.  Still able to communicate, but rate of failure is higher than in weeks prior.  He has also been experiencing swelling and redness in lower aspect of both legs, recently obtained ultrasound we ordered.  Rash around his trunk and torso has also worsened  this week. Gait remains independent.  Denies seizures or headaches.  Continues to dose Lamictal 50mg  twice per day.  Right arm shoulder pain is persistent.     Decadron: 10/05/20: 8mg  10/19/20: 4mg  11/15/20: 1mg  11/23/20: -  H+P (05/10/20) Patient presented to medical attention in early August with several months history of sensory episodes.  They are described as "numbness marching down right arm also involving the leg, lasting for several minutes".  There is no post event weakness or confusion.  No known history of seizures.  In recent weeks he has also experienced some difficulty with expressive language, finding particular words and maintaining high fluency.  Since starting Keppra post-operatively he has not had any recurrence of these sensory events.  He has weaned off dexamethasone.  We are still awaiting finalized pathology results.    Medications: Current Outpatient Medications on File Prior to Visit  Medication Sig Dispense Refill  . amLODipine (NORVASC) 5 MG tablet Take 5 mg by mouth at bedtime.    . gabapentin (NEURONTIN) 300 MG capsule Take 1 capsule (300 mg total) by mouth 2 (two) times daily. 60 capsule 3  . hydrocortisone (CORTEF) 10 MG tablet TAKE 1 TABLET BY MOUTH TWICE A DAY 60 tablet 1  . ibuprofen (ADVIL) 200 MG tablet Take 400 mg by mouth 2 (two) times daily.    Marland Kitchen lamoTRIgine (LAMICTAL) 25 MG tablet Take 50mg  by AM x7 days, then increase to 50mg  twice per day 120 tablet 1  . levETIRAcetam (KEPPRA) 500 MG  tablet TAKE 1 TABLET BY MOUTH TWICE A DAY 60 tablet 1  . sertraline (ZOLOFT) 25 MG tablet TAKE 1 TABLET (25 MG TOTAL) BY MOUTH DAILY. 30 tablet 3  . simvastatin (ZOCOR) 40 MG tablet Take 40 mg by mouth at bedtime.    . dronabinol (MARINOL) 2.5 MG capsule Take 1 capsule (2.5 mg total) by mouth 2 (two) times daily before a meal. (Patient not taking: No sig reported) 60 capsule 2  . lisinopril (ZESTRIL) 20 MG tablet Take 2 tablets (40 mg total) by mouth daily. (Patient not taking:  No sig reported) 120 tablet 3  . methylphenidate (RITALIN) 5 MG tablet Take 1 tablet (5 mg total) by mouth 2 (two) times daily. (Patient not taking: No sig reported) 60 tablet 0  . ondansetron (ZOFRAN) 8 MG tablet Take 1 tablet (8 mg total) by mouth 2 (two) times daily as needed (nausea and vomiting). May take 30-60 minutes prior to Temodar administration if nausea/vomiting occurs. (Patient not taking: No sig reported) 30 tablet 1   No current facility-administered medications on file prior to visit.    Allergies:  Allergies  Allergen Reactions  . Pravastatin     Other reaction(s): muscle aches   Past Medical History:  Past Medical History:  Diagnosis Date  . High cholesterol   . History of kidney stones    Past Surgical History:  Past Surgical History:  Procedure Laterality Date  . APPLICATION OF CRANIAL NAVIGATION Left 04/25/2020   Procedure: APPLICATION OF CRANIAL NAVIGATION;  Surgeon: Lisbeth Renshaw, MD;  Location: [facility] OR;  Service: Neurosurgery;  Laterality: Left;  . CRANIOTOMY Left 04/25/2020   Procedure: STEREOTACTIC LEFT TEMPORAL CRANIOTOMY FOR TUMOR;  Surgeon: Lisbeth Renshaw, MD;  Location: [facility] OR;  Service: Neurosurgery;  Laterality: Left;  . HERNIA REPAIR Left    groin    Social History:  Social History   Socioeconomic History  . Marital status: Married    Spouse name: Not on file  . Number of children: Not on file  . Years of education: Not on file  . Highest education level: Not on file  Occupational History  . Not on file  Tobacco Use  . Smoking status: Never Smoker  . Smokeless tobacco: Never Used  Substance and Sexual Activity  . Alcohol use: Yes    Alcohol/week: 6.0 standard drinks    Types: 3 Glasses of wine, 3 Cans of beer per week  . Drug use: Not Currently  . Sexual activity: Not on file  Other Topics Concern  . Not on file  Social History Narrative  . Not on file   Social Determinants of Health   Financial Resource Strain: Not on file   Food Insecurity: Not on file  Transportation Needs: Not on file  Physical Activity: Not on file  Stress: Not on file  Social Connections: Not on file  Intimate Partner Violence: Not on file   Family History: No family history on file.  Review of Systems: Constitutional: Doesn't report fevers, chills or abnormal weight loss Eyes: Doesn't report blurriness of vision Ears, nose, mouth, throat, and face: Doesn't report sore throat Respiratory: Doesn't report cough, dyspnea or wheezes Cardiovascular: Doesn't report palpitation, chest discomfort  Gastrointestinal:  Doesn't report nausea, constipation, diarrhea GU: Doesn't report incontinence Skin: torso rash Neurological: Per HPI Musculoskeletal: Doesn't report joint pain Behavioral/Psych: Doesn't report anxiety  Physical Exam: Vitals:   02/07/21 1303  BP: (!) 152/96  Pulse: 91  Resp: 17  Temp: 97.9 F (36.6 C)  SpO2: 99%   KPS: 70. General: Comfortable, NAD Head: Normal EENT: No conjunctival injection or scleral icterus.  Lungs: Resp effort normal Cardiac: Regular rate Abdomen: Non-distended abdomen Skin: Petechial rash along back and abdomen Extremities: 2+ edema B/L lower extremities, with erythema (symmetric)  Neurologic Exam: Mental Status: Awake, alert, attentive to examiner. Oriented to self and environment. Language c/w transcortical expressive dyshpasia Cranial Nerves: Visual acuity is grossly normal. Right field hemianopia. Extra-ocular movements intact. No ptosis. Face is symmetric Motor: Tone and bulk are normal. 4+/5 in right arm and leg. Reflexes are symmetric, no pathologic reflexes present.  Sensory: Intact to light touch Gait: Dystaxic, hemiparetic  Labs: I have reviewed the data as listed    Component Value Date/Time   NA 141 01/24/2021 1210   K 4.1 01/24/2021 1210   CL 108 01/24/2021 1210   CO2 24 01/24/2021 1210   GLUCOSE 104 (H) 01/24/2021 1210   BUN 17 01/24/2021 1210   CREATININE 0.86  01/24/2021 1210   CREATININE 1.07 09/17/2020 0823   CALCIUM 9.5 01/24/2021 1210   PROT 7.4 01/24/2021 1210   ALBUMIN 4.1 01/24/2021 1210   AST 32 01/24/2021 1210   AST 24 09/17/2020 0823   ALT 43 01/24/2021 1210   ALT 50 (H) 09/17/2020 0823   ALKPHOS 61 01/24/2021 1210   BILITOT 0.8 01/24/2021 1210   BILITOT 1.2 09/17/2020 0823   GFRNONAA >60 01/24/2021 1210   GFRNONAA >60 09/17/2020 0823   GFRAA >60 06/14/2020 0828   Lab Results  Component Value Date   WBC 4.4 02/07/2021   NEUTROABS 2.8 02/07/2021   HGB 14.9 02/07/2021   HCT 42.0 02/07/2021   MCV 85.2 02/07/2021   PLT 144 (L) 02/07/2021    Imaging:  [facility] Clinician Interpretation: I have personally reviewed the CNS images as listed.  My interpretation, in the context of the patient's clinical presentation, is progressive disease  MR BRAIN W WO CONTRAST  Result Date: 02/01/2021 CLINICAL DATA:  Follow-up glioblastoma. EXAM: MRI HEAD WITHOUT AND WITH CONTRAST TECHNIQUE: Multiplanar, multiecho pulse sequences of the brain and surrounding structures were obtained without and with intravenous contrast. CONTRAST:  45mL GADAVIST GADOBUTROL 1 MMOL/ML IV SOLN COMPARISON:  12/06/2020 and 10/04/2020 FINDINGS: Brain: The large centrally necrotic mass with chronic blood products in the superior left temporal lobe corresponding to known glioblastoma has not significantly changed in overall size from the most recent prior study. Peripheral enhancement has mildly increased from the most recent prior study including a 1.2 cm nodular focus medially (series 18, image 69), however the overall degree of enhancement remains less than on the 10/04/2020 study. Nonenhancing T2 hyperintensity in the dorsal left thalamus has slightly increased (series 11, image 26) but also remains less than on 10/04/2020. Nonenhancing T2 hyperintensity in the white matter surrounding the mass elsewhere has not significantly changed. Regional mass effect is unchanged. There is  no midline shift. No acute infarct or extra-axial fluid collection is present. Scattered small foci of T2 hyperintensity in the cerebral white matter bilaterally are similar to the prior study and are nonspecific but compatible with mild chronic small vessel ischemic disease. The ventricles are unchanged in size. Vascular: Major intracranial vascular flow voids are preserved. Skull and upper cervical spine: Left-sided craniotomy. Unremarkable bone marrow signal para Sinuses/Orbits: Unremarkable orbits. Paranasal sinuses and mastoid air cells are clear. Other: None. IMPRESSION: Mild interval increase of peripheral enhancement of the left temporal glioblastoma and slightly increased nonenhancing T2 signal abnormality in the left thalamus, although both remain  improved compared to 10/04/2020. Findings may reflect slightly increased tumor or treatment effects. Electronically Signed   By: Logan Bores M.D.   On: 02/01/2021 16:58   VAS [place] LOWER EXTREMITY VENOUS (DVT)  Result Date: 02/07/2021  Lower Venous DVT Study Patient Name:  RAQUON MILLEDGE  Date of Exam:   02/07/2021 Medical Rec #: [id number]    Accession #:    [id number] Date of Birth: [birth year]     Patient Gender: M Patient Age:   60Y Exam [facility]  [facility] Procedure:      VAS [place] LOWER EXTREMITY VENOUS (DVT) Referring Phys: [id number] [place] --------------------------------------------------------------------------------  Indications: Swelling, and Pain.  Risk Factors: Cancer (Glioblastoma - on treatment). Comparison Study: No previous exams Performing Technologist: Rogelia Rohrer  Examination Guidelines: A complete evaluation includes B-mode imaging, spectral Doppler, color Doppler, and power Doppler as needed of all accessible portions of each vessel. Bilateral testing is considered an integral part of a complete examination. Limited examinations for reoccurring indications may be performed as noted. The reflux portion of the exam is performed with  the patient in reverse Trendelenburg.  +---------+---------------+---------+-----------+----------+--------------+ RIGHT    CompressibilityPhasicitySpontaneityPropertiesThrombus Aging +---------+---------------+---------+-----------+----------+--------------+ CFV      Full           Yes      Yes                                 +---------+---------------+---------+-----------+----------+--------------+ SFJ      Full                                                        +---------+---------------+---------+-----------+----------+--------------+ FV Prox  Full           Yes      Yes                                 +---------+---------------+---------+-----------+----------+--------------+ FV Mid   Full           Yes      Yes                                 +---------+---------------+---------+-----------+----------+--------------+ FV DistalFull           Yes      Yes                                 +---------+---------------+---------+-----------+----------+--------------+ PFV      Full                                                        +---------+---------------+---------+-----------+----------+--------------+ POP      Full           Yes      Yes                                 +---------+---------------+---------+-----------+----------+--------------+  PTV      Full                                                        +---------+---------------+---------+-----------+----------+--------------+ PERO     Full                                                        +---------+---------------+---------+-----------+----------+--------------+   +---------+---------------+---------+-----------+----------+--------------+ LEFT     CompressibilityPhasicitySpontaneityPropertiesThrombus Aging +---------+---------------+---------+-----------+----------+--------------+ CFV      Full           Yes      Yes                                  +---------+---------------+---------+-----------+----------+--------------+ SFJ      Full                                                        +---------+---------------+---------+-----------+----------+--------------+ FV Prox  Full           Yes      Yes                                 +---------+---------------+---------+-----------+----------+--------------+ FV Mid   Full           Yes      Yes                                 +---------+---------------+---------+-----------+----------+--------------+ FV DistalFull           Yes      Yes                                 +---------+---------------+---------+-----------+----------+--------------+ PFV      Full                                                        +---------+---------------+---------+-----------+----------+--------------+ POP      Full           Yes      Yes                                 +---------+---------------+---------+-----------+----------+--------------+ PTV      Full                                                        +---------+---------------+---------+-----------+----------+--------------+  PERO     Full                                                        +---------+---------------+---------+-----------+----------+--------------+    Summary: BILATERAL: - No evidence of deep vein thrombosis seen in the lower extremities, bilaterally. - No evidence of superficial venous thrombosis in the lower extremities, bilaterally. -No evidence of popliteal cyst, bilaterally.   *See table(s) above for measurements and observations.    Preliminary    Assessment/Plan Glioblastoma, IDH-wildtype ([place]) [C71.9]  Sahaj Bona presents today with modest decline in expressive language function.  MRI demonstrates small focus of T2 signal abnormality within posterior/lateral left thalamic body, as well as new area of nodular enhancement more medially.  These changes represent less than 10% of  tumor volume, but still are likely consistent with early progression of disease.    We recommended initiating treatment with metronomic Temozolomide 50 mg/m2, to be taken daily.  This may be more preferable regimen, as with higher dose Temozolomide he had profound thrombocytopenia. The patient will have a complete blood count performed on days 21 and 28 of each cycle, and a comprehensive metabolic panel performed on day 28 of each cycle. Labs may need to be performed more often. Zofran will prescribed for home use for nausea/vomiting.   Informed consent was obtained verbally at bedside to proceed with oral chemotherapy.  Chemotherapy should be held for the following:  ANC less than 1,000  Platelets less than 100,000  LFT or creatinine greater than 2x ULN  If clinical concerns/contraindications develop  We also recommended continuing treatment with Avastin $RemoveBefo'10mg'RkwFqwlTuCl$ /kg IV q2 weeks.    Avastin should be held for the following:  ANC less than 1500  Platelets less than 50,000  LFT or creatinine greater than 2x ULN  If clinical concerns/contraindications develop  For dependent edema, this may be effect of Norvasc.  He should discontinue Norvasc, we will initiate therapy with Metoprolol $RemoveBeforeD'50mg'JPFbiLsQWRsPES$  BID for hypertension.  He is visiting his primary care doctor next week, he can continue titration of Metoprolol or switch to secondary agent if preferred.  For diffuse petechial rash, this may be effect of Lamictal.  Because of risk of progression to mucosal rash, we will recommend transition to alternate AED: -Start Depakote $RemoveBeforeD'1000mg'WFCsAxFgPqEPRT$  BID -Decrease Lamictal to $RemoveBef'50mg'IdZSTjjEIK$  daily x3 days -After 3 days, discontinue Lamictal and continue Depakote $RemoveBeforeDEI'1000mg'cZTlFsiVlduWTcBv$  BID -Will check VPA level in serum in 2 weeks.  We will call Maria Gallicchio early next week to assess response to these interventions.  We ask that he hold off on starting chemo until the edema and rash are improving.  We ask that Traivon Morrical return to in-person clinic  in 2 weeks for next avastin treatment.  All questions were answered. The patient knows to call the clinic with any problems, questions or concerns. No barriers to learning were detected.  I have spent a total of 40 minutes of face-to-face and non-face-to-face time, excluding clinical staff time, preparing to see patient, ordering tests and/or medications, counseling the patient, and independently interpreting results and communicating results to the patient/family/caregiver    [name], MD Medical Director of Neuro-Oncology [facility] [place] 02/07/21 1:17 PM

## 2021-02-07 NOTE — Progress Notes (Signed)
BLE venous duplex has been completed.  Preliminary results called to Maudie Mercury, RN.  Results can be found under chart review under CV PROC. 02/07/2021 11:42 AM Kanyla Omeara RVT, RDMS

## 2021-02-07 NOTE — Patient Instructions (Signed)
[facility] MEDICAL [facility]COLOG[facility]  Discharge Instructions: °Thank you for choosing [facility] to provide your oncology and hematology care.  ° °If you have a lab appointment with the Cancer Center, please go directly to the Cancer Center and check in at the registration area. °  °Wear comfortable clothing and clothing appropriate for easy access to any Portacath or PICC line.  ° °We strive to give you quality time with your provider. You may need to reschedule your appointment if you arrive late (15 or more minutes).  Arriving late affects you and other patients whose appointments are after yours.  Also, if you miss three or more appointments without notifying the office, you may be dismissed from the clinic at the provider’s discretion.    °  °For prescription refill requests, have your pharmacy contact our office and allow 72 hours for refills to be completed.   ° °Today you received the following chemotherapy and/or immunotherapy agents: Bevacizumab.     °  °To help prevent nausea and vomiting after your treatment, we encourage you to take your nausea medication as directed. ° °BELOW ARE SYMPTOMS THAT SHOULD BE REPORTED IMMEDIATELY: °*FEVER GREATER THAN 100.4 F (38 °C) OR HIGHER °*CHILLS OR SWEATING °*NAUSEA AND VOMITING THAT IS NOT CONTROLLED WITH YOUR NAUSEA MEDICATION °*UNUSUAL SHORTNESS OF BREATH °*UNUSUAL BRUISING OR BLEEDING °*URINARY PROBLEMS (pain or burning when urinating, or frequent urination) °*BOWEL PROBLEMS (unusual diarrhea, constipation, pain near the anus) °TENDERNESS IN MOUTH AND THROAT WITH OR WITHOUT PRESENCE OF ULCERS (sore throat, sores in mouth, or a toothache) °UNUSUAL RASH, SWELLING OR PAIN  °UNUSUAL VAGINAL DISCHARGE OR ITCHING  ° °Items with * indicate a potential emergency and should be followed up as soon as possible or go to the Emergency Department if any problems should occur. ° °Please show the CHEMOTHERAPY ALERT CARD or IMMUNOTHERAPY ALERT CARD at check-in  to the Emergency Department and triage nurse. ° °Should you have questions after your visit or need to cancel or reschedule your appointment, please contact [facility]  Dept: [phone]  and follow the prompts.  Office hours are 8:00 a.m. to 4:30 p.m. Monday - Friday. Please note that voicemails left after 4:00 p.m. may not be returned until the following business day.  We are closed weekends and major holidays. You have access to a nurse at all times for urgent questions. Please call the main number to the clinic Dept: [phone] and follow the prompts. ° ° °For any non-urgent questions, you may also contact your provider using MyChart. We now offer e-Visits for anyone 18 and older to request care online for non-urgent symptoms. For details visit [email or web address]. °  °Also download the MyChart app! Go to the app store, search "MyChart", open the app, select [facility], and log in with your MyChart username and password. ° °Due to Covid, a mask is required upon entering the hospital/clinic. If you do not have a mask, one will be given to you upon arrival. For doctor visits, patients may have 1 support person aged 18 or older with them. For treatment visits, patients cannot have anyone with them due to current Covid guidelines and our immunocompromised population.  ° °

## 2021-02-11 ENCOUNTER — Other Ambulatory Visit (HOSPITAL_COMMUNITY): Payer: Self-pay

## 2021-02-11 ENCOUNTER — Other Ambulatory Visit: Payer: Self-pay | Admitting: Pharmacist

## 2021-02-11 ENCOUNTER — Inpatient Hospital Stay (HOSPITAL_BASED_OUTPATIENT_CLINIC_OR_DEPARTMENT_OTHER): Payer: 59 | Admitting: Internal Medicine

## 2021-02-11 ENCOUNTER — Encounter: Payer: Self-pay | Admitting: Internal Medicine

## 2021-02-11 DIAGNOSIS — C719 Malignant neoplasm of brain, unspecified: Secondary | ICD-10-CM | POA: Diagnosis not present

## 2021-02-11 DIAGNOSIS — R569 Unspecified convulsions: Secondary | ICD-10-CM

## 2021-02-11 MED ORDER — TEMOZOLOMIDE 100 MG PO CAPS
50.0000 mg/m2/d | ORAL_CAPSULE | Freq: Every day | ORAL | 0 refills | Status: DC
  Filled 2021-02-11: qty 30, 30d supply, fill #0

## 2021-02-11 MED ORDER — TEMOZOLOMIDE 100 MG PO CAPS: 50.0000 mg/m2/d | ORAL_CAPSULE | Freq: Every day | ORAL | 0 refills | Status: DC

## 2021-02-11 NOTE — Progress Notes (Signed)
I connected with Christopher Tucker on 02/11/21 at  9:30 AM EDT by telephone visit and verified that I am speaking with the correct person using two identifiers.  I discussed the limitations, risks, security and privacy concerns of performing an evaluation and management service by telemedicine and the availability of in-person appointments. I also discussed with the patient that there may be a patient responsible charge related to this service. The patient expressed understanding and agreed to proceed.  Other persons participating in the visit and their role in the encounter:  spouse  Patient's location:  Home  Provider's location:  Office  Chief Complaint:  Glioblastoma, IDH-wildtype (Christopher Tucker) - Plan: DISCONTINUED: temozolomide (TEMODAR) 100 MG capsule  History of Present Ilness: Christopher Tucker describes clear improvement in leg swelling and redness since discontinuing the amlodipine.  It is still "there" but improved from prior.  It is unclear if the chest/back rash has improved, Lamictal was only discontinued yesterday after starting the Depakote last week.  Blood pressure was elevated this morning after taking it at home, diastolic was greater than 100 per patient.  Observations: Language and cognition at baseline  Assessment and Plan: Glioblastoma, IDH-wildtype (Christopher Tucker) - Plan: DISCONTINUED: temozolomide (TEMODAR) 100 MG capsule  Recommended increasing Metoprolol to 175m BID due to refractory hypertension, will ask his primary care doctor to continue titration during visit later this week.  Depakote should continue at 10095mBID, we will check levels when he returns for avastin next week.  For progressive glioblastoma, per our discussion in clinic last week, we recommended initiating treatment with daily metronomic Temozolomide 50 mg/m2. The patient will have a complete blood count performed on day 28 of each cycle, and a comprehensive metabolic panel performed on day 28 of each cycle. Labs may need to be  performed more often. Zofran will prescribed for home use for nausea/vomiting.   Informed consent was obtained verbally at bedside to proceed with oral chemotherapy.  Chemotherapy should be held for the following:  ANC less than 1,000  Platelets less than 100,000  LFT or creatinine greater than 2x ULN  If clinical concerns/contraindications develop  Follow Up Instructions: RTC next week for avastin infusion, labs  I discussed the assessment and treatment plan with the patient.  The patient was provided an opportunity to ask questions and all were answered.  The patient agreed with the plan and demonstrated understanding of the instructions.    The patient was advised to call back or seek an in-person evaluation if the symptoms worsen or if the condition fails to improve as anticipated.  I provided more than 5-10 minutes of non-face-to-face time during this enocunter.  ZaVentura SellersMD   I provided 25 minutes of non face-to-face telephone visit time during this encounter, and > 50% was spent counseling as documented under my assessment & plan.

## 2021-02-11 NOTE — Progress Notes (Signed)
Oral Oncology Pharmacist Encounter  Prescription for Temodar (temozolomide) sent to [facility] in error. Patient's insurance requires prescription be filled through [facility]. Prescription redirected to [facility].  Leron Croak, PharmD, BCPS Hematology/Oncology Clinical Pharmacist [facility] [phone] 02/11/2021 12:30 PM

## 2021-02-13 ENCOUNTER — Other Ambulatory Visit (HOSPITAL_COMMUNITY): Payer: Self-pay

## 2021-02-14 ENCOUNTER — Telehealth: Payer: Self-pay | Admitting: Pharmacist

## 2021-02-14 ENCOUNTER — Other Ambulatory Visit (HOSPITAL_COMMUNITY): Payer: Self-pay

## 2021-02-14 ENCOUNTER — Encounter: Payer: Self-pay | Admitting: Internal Medicine

## 2021-02-14 NOTE — Telephone Encounter (Signed)
Oral Chemotherapy Pharmacist Encounter   Notified that prescription for Temodar (temozolomide) had been transferred from [facility] to [facility]. Confirmed with pharmacist that patient is now required to fill through [facility]. Patient's copay for upcoming fill for temozolomide is $10. Was told by pharmacist at [facility] that they would be able to overnight ship prescription to patient's home for delivery 02/15/21.   Called patient's wife, Shamere Campas, and updated her about the change in pharmacy. Provided Ms. Fabel with the phone number to set up shipment directly through the oncology division of [facility] [phone]).   Leron Croak, PharmD, BCPS Hematology/Oncology Clinical Pharmacist [facility] [phone] 02/14/2021 3:15 PM

## 2021-02-14 NOTE — Telephone Encounter (Addendum)
Oral Chemotherapy Pharmacist Encounter   Spoke with patient's wife today to follow up regarding patient's oral chemotherapy medication: Temodar (temozolomide)  Confirmed with patient's wife that Temodar is required to be filled through [facility] per their insurance requirements. Wife provided phone number for [facility] [phone]) to call and request expedited shipment.   Ms. Spradley expressed appreciation. She knows to call the office with any questions or concerns.   Leron Croak, PharmD, BCPS Hematology/Oncology Clinical Pharmacist [facility] [phone] 02/14/2021 8:10 AM

## 2021-02-21 ENCOUNTER — Other Ambulatory Visit: Payer: Self-pay

## 2021-02-21 ENCOUNTER — Inpatient Hospital Stay: Payer: 59 | Attending: Neurosurgery

## 2021-02-21 ENCOUNTER — Inpatient Hospital Stay: Payer: 59

## 2021-02-21 ENCOUNTER — Inpatient Hospital Stay (HOSPITAL_BASED_OUTPATIENT_CLINIC_OR_DEPARTMENT_OTHER): Payer: 59 | Admitting: Internal Medicine

## 2021-02-21 VITALS — BP 145/99 | HR 61

## 2021-02-21 VITALS — BP 160/97 | HR 59 | Temp 97.8°F | Resp 17 | Ht 71.0 in | Wt 197.6 lb

## 2021-02-21 DIAGNOSIS — Z5112 Encounter for antineoplastic immunotherapy: Secondary | ICD-10-CM | POA: Insufficient documentation

## 2021-02-21 DIAGNOSIS — C719 Malignant neoplasm of brain, unspecified: Secondary | ICD-10-CM | POA: Insufficient documentation

## 2021-02-21 DIAGNOSIS — Z923 Personal history of irradiation: Secondary | ICD-10-CM | POA: Diagnosis not present

## 2021-02-21 DIAGNOSIS — R569 Unspecified convulsions: Secondary | ICD-10-CM

## 2021-02-21 DIAGNOSIS — Z79899 Other long term (current) drug therapy: Secondary | ICD-10-CM | POA: Insufficient documentation

## 2021-02-21 DIAGNOSIS — R5383 Other fatigue: Secondary | ICD-10-CM | POA: Insufficient documentation

## 2021-02-21 DIAGNOSIS — I10 Essential (primary) hypertension: Secondary | ICD-10-CM | POA: Diagnosis not present

## 2021-02-21 DIAGNOSIS — Z87442 Personal history of urinary calculi: Secondary | ICD-10-CM | POA: Insufficient documentation

## 2021-02-21 DIAGNOSIS — Z7952 Long term (current) use of systemic steroids: Secondary | ICD-10-CM | POA: Diagnosis not present

## 2021-02-21 LAB — COMPREHENSIVE METABOLIC PANEL
ALT: 18 U/L (ref 0–44)
AST: 22 U/L (ref 15–41)
Albumin: 3.5 g/dL (ref 3.5–5.0)
Alkaline Phosphatase: 50 U/L (ref 38–126)
Anion gap: 10 (ref 5–15)
BUN: 19 mg/dL (ref 6–20)
CO2: 23 mmol/L (ref 22–32)
Calcium: 9.1 mg/dL (ref 8.9–10.3)
Chloride: 110 mmol/L (ref 98–111)
Creatinine, Ser: 0.84 mg/dL (ref 0.61–1.24)
GFR, Estimated: >60 mL/min (ref >60)
Glucose, Bld: 81 mg/dL (ref 70–99)
Potassium: 4.2 mmol/L (ref 3.5–5.1)
Sodium: 143 mmol/L (ref 135–145)
Total Bilirubin: 0.4 mg/dL (ref 0.3–1.2)
Total Protein: 6.6 g/dL (ref 6.5–8.1)

## 2021-02-21 LAB — CBC WITH DIFFERENTIAL/PLATELET
Abs Immature Granulocytes: 0.06 10*3/uL (ref 0.00–0.07)
Basophils Absolute: 0 10*3/uL (ref 0.0–0.1)
Basophils Relative: 1 %
Eosinophils Absolute: 0 10*3/uL (ref 0.0–0.5)
Eosinophils Relative: 1 %
HCT: 44 % (ref 39.0–52.0)
Hemoglobin: 14.6 g/dL (ref 13.0–17.0)
Immature Granulocytes: 1 %
Lymphocytes Relative: 25 %
Lymphs Abs: 1.1 10*3/uL (ref 0.7–4.0)
MCH: 29.6 pg (ref 26.0–34.0)
MCHC: 33.2 g/dL (ref 30.0–36.0)
MCV: 89.1 fL (ref 80.0–100.0)
Monocytes Absolute: 0.9 10*3/uL (ref 0.1–1.0)
Monocytes Relative: 19 %
Neutro Abs: 2.3 10*3/uL (ref 1.7–7.7)
Neutrophils Relative %: 53 %
Platelets: 144 10*3/uL — ABNORMAL LOW (ref 150–400)
RBC: 4.94 MIL/uL (ref 4.22–5.81)
RDW: 13.1 % (ref 11.5–15.5)
WBC: 4.4 10*3/uL (ref 4.0–10.5)
nRBC: 0 % (ref 0.0–0.2)

## 2021-02-21 LAB — TOTAL PROTEIN, URINE DIPSTICK: Protein, ur: NEGATIVE mg/dL

## 2021-02-21 LAB — VALPROIC ACID LEVEL: Valproic Acid Lvl: 115 ug/mL — ABNORMAL HIGH (ref 50.0–100.0)

## 2021-02-21 MED ORDER — DIVALPROEX SODIUM 500 MG PO DR TAB
500.0000 mg | DELAYED_RELEASE_TABLET | Freq: Two times a day (BID) | ORAL | 3 refills | Status: DC
Start: 2021-02-21 — End: 2021-06-24

## 2021-02-21 MED ORDER — SODIUM CHLORIDE 0.9 % IV SOLN
10.0000 mg/kg | Freq: Once | INTRAVENOUS | Status: AC
  Administered 2021-02-21: 900 mg via INTRAVENOUS
  Filled 2021-02-21: qty 32

## 2021-02-21 MED ORDER — SODIUM CHLORIDE 0.9 % IV SOLN
Freq: Once | INTRAVENOUS | Status: AC
  Filled 2021-02-21: qty 250

## 2021-02-21 NOTE — Progress Notes (Signed)
[facility] [place], [place] [address] [phone]   Interval Evaluation  Date of Service: 02/21/21 Patient Name: Christopher Tucker Patient MRN: [id number] Patient DOB: 15-Jul-1961 Provider: [name], MD  Identifying Statement:  Christopher Tucker is a 60 y.o. male with left temporal anaplastic astrocytoma   Oncologic History: Oncology History  Glioblastoma, IDH-wildtype ([place])  05/14/2020 Initial Diagnosis   Anaplastic astrocytoma, IDH-wildtype ([place])   05/21/2020 - 07/02/2020 Radiation Therapy   IMRT and concurrent Temodar with Dr. Lisbeth Renshaw   07/30/2020 -  Chemotherapy    Patient is on Treatment Plan: BRAIN ANAPLASTIC GLIOMA GRADE III TEMOZOLOMIDE POST XRT Q28D   Patient is on Antibody Plan: BRAIN GBM BEVACIZUMAB 14D X 6 CYCLES    10/18/2020 -  Chemotherapy    Patient is on Treatment Plan: BRAIN ANAPLASTIC GLIOMA GRADE III TEMOZOLOMIDE POST XRT Q28D   Patient is on Antibody Plan: BRAIN GBM BEVACIZUMAB 14D X 6 CYCLES       Biomarkers:  MGMT Unknown.  IDH 1/2 Wild type.  EGFR Unknown  TERT "Mutated   Interval History:  Christopher Tucker presents today for avastin infusion.  Tolerating the addition of Temodar without issue. No significant changes in speech output and finding words.  Leg swelling has improved markedly, as has his body rash.  Gait remains independent.  Denies seizures or headaches.  Depakote is dosed at 1056m twice per day currently, no seizures.  Right arm shoulder pain is improved since PCP stopped simvastatin.  Decadron: 10/05/20: 821m1/28/22: 17m13m/24/22: 1mg85m04/22: -  H+P (05/10/20) Patient presented to medical attention in early August with several months history of sensory episodes.  They are described as "numbness marching down right arm also involving the leg, lasting for several minutes".  There is no post event weakness or confusion.  No known history of seizures.  In recent weeks he has also experienced  some difficulty with expressive language, finding particular words and maintaining high fluency.  Since starting Keppra post-operatively he has not had any recurrence of these sensory events.  He has weaned off dexamethasone.  We are still awaiting finalized pathology results.    Medications: Current Outpatient Medications on File Prior to Visit  Medication Sig Dispense Refill  . divalproex (DEPAKOTE) 500 MG DR tablet Take 2 tablets (1,000 mg total) by mouth 2 (two) times daily. 120 tablet 3  . dronabinol (MARINOL) 2.5 MG capsule Take 1 capsule (2.5 mg total) by mouth 2 (two) times daily before a meal. (Patient not taking: No sig reported) 60 capsule 2  . gabapentin (NEURONTIN) 300 MG capsule Take 1 capsule (300 mg total) by mouth 2 (two) times daily. 60 capsule 3  . hydrocortisone (CORTEF) 10 MG tablet TAKE 1 TABLET BY MOUTH TWICE A DAY 60 tablet 1  . ibuprofen (ADVIL) 200 MG tablet Take 400 mg by mouth 2 (two) times daily.    . liMarland Kitcheninopril (ZESTRIL) 20 MG tablet Take 2 tablets (40 mg total) by mouth daily. (Patient not taking: No sig reported) 120 tablet 3  . methylphenidate (RITALIN) 5 MG tablet Take 1 tablet (5 mg total) by mouth 2 (two) times daily. (Patient not taking: No sig reported) 60 tablet 0  . metoprolol tartrate (LOPRESSOR) 50 MG tablet Take 1 tablet (50 mg total) by mouth 2 (two) times daily. 60 tablet 3  . ondansetron (ZOFRAN) 8 MG tablet Take 1 tablet (8 mg total) by mouth 2 (two) times daily as needed (nausea  and vomiting). May take 30-60 minutes prior to Temodar administration if nausea/vomiting occurs. (Patient not taking: No sig reported) 30 tablet 1  . sertraline (ZOLOFT) 25 MG tablet TAKE 1 TABLET (25 MG TOTAL) BY MOUTH DAILY. 30 tablet 3  . simvastatin (ZOCOR) 40 MG tablet Take 40 mg by mouth at bedtime.    . temozolomide (TEMODAR) 100 MG capsule Take 1 capsule (100 mg total) by mouth at bedtime. Take every evening. May take on an empty stomach to decrease nausea & vomiting.  30 capsule 0   No current facility-administered medications on file prior to visit.    Allergies:  Allergies  Allergen Reactions  . Pravastatin     Other reaction(s): muscle aches   Past Medical History:  Past Medical History:  Diagnosis Date  . High cholesterol   . History of kidney stones    Past Surgical History:  Past Surgical History:  Procedure Laterality Date  . APPLICATION OF CRANIAL NAVIGATION Left 04/25/2020   Procedure: APPLICATION OF CRANIAL NAVIGATION;  Surgeon: Consuella Lose, MD;  Location: [facility];  Service: Neurosurgery;  Laterality: Left;  . CRANIOTOMY Left 04/25/2020   Procedure: STEREOTACTIC LEFT TEMPORAL CRANIOTOMY FOR TUMOR;  Surgeon: Consuella Lose, MD;  Location: [facility];  Service: Neurosurgery;  Laterality: Left;  . HERNIA REPAIR Left    groin    Social History:  Social History   Socioeconomic History  . Marital status: Married    Spouse name: Not on file  . Number of children: Not on file  . Years of education: Not on file  . Highest education level: Not on file  Occupational History  . Not on file  Tobacco Use  . Smoking status: Never Smoker  . Smokeless tobacco: Never Used  Substance and Sexual Activity  . Alcohol use: Yes    Alcohol/week: 6.0 standard drinks    Types: 3 Glasses of wine, 3 Cans of beer per week  . Drug use: Not Currently  . Sexual activity: Not on file  Other Topics Concern  . Not on file  Social History Narrative  . Not on file   Social Determinants of Health   Financial Resource Strain: Not on file  Food Insecurity: Not on file  Transportation Needs: Not on file  Physical Activity: Not on file  Stress: Not on file  Social Connections: Not on file  Intimate Partner Violence: Not on file   Family History: No family history on file.  Review of Systems: Constitutional: Doesn't report fevers, chills or abnormal weight loss Eyes: Doesn't report blurriness of vision Ears, nose, mouth, throat, and face: Doesn't  report sore throat Respiratory: Doesn't report cough, dyspnea or wheezes Cardiovascular: Doesn't report palpitation, chest discomfort  Gastrointestinal:  Doesn't report nausea, constipation, diarrhea GU: Doesn't report incontinence Skin: torso rash Neurological: Per HPI Musculoskeletal: Doesn't report joint pain Behavioral/Psych: Doesn't report anxiety  Physical Exam: Vitals:   02/21/21 1235  BP: (!) 160/97  Pulse: (!) 59  Resp: 17  Temp: 97.8 F (36.6 C)  SpO2: 100%   KPS: 70. General: Comfortable, NAD Head: Normal EENT: No conjunctival injection or scleral icterus.  Lungs: Resp effort normal Cardiac: Regular rate Abdomen: Non-distended abdomen Skin: normal Extremities: normal  Neurologic Exam: Mental Status: Awake, alert, attentive to examiner. Oriented to self and environment. Language c/w transcortical expressive dyshpasia Cranial Nerves: Visual acuity is grossly normal. Right field hemianopia. Extra-ocular movements intact. No ptosis. Face is symmetric Motor: Tone and bulk are normal. 4+/5 in right arm and leg.  Reflexes are symmetric, no pathologic reflexes present.  Sensory: Intact to light touch Gait: Dystaxic, hemiparetic  Labs: I have reviewed the data as listed    Component Value Date/Time   NA 142 02/07/2021 1228   K 3.8 02/07/2021 1228   CL 107 02/07/2021 1228   CO2 24 02/07/2021 1228   GLUCOSE 120 (H) 02/07/2021 1228   BUN 17 02/07/2021 1228   CREATININE 0.89 02/07/2021 1228   CREATININE 1.07 09/17/2020 0823   CALCIUM 9.6 02/07/2021 1228   PROT 7.5 02/07/2021 1228   ALBUMIN 4.1 02/07/2021 1228   AST 36 02/07/2021 1228   AST 24 09/17/2020 0823   ALT 58 (H) 02/07/2021 1228   ALT 50 (H) 09/17/2020 0823   ALKPHOS 70 02/07/2021 1228   BILITOT 0.9 02/07/2021 1228   BILITOT 1.2 09/17/2020 0823   GFRNONAA >60 02/07/2021 1228   GFRNONAA >60 09/17/2020 0823   GFRAA >60 06/14/2020 0828   Lab Results  Component Value Date   WBC 4.4 02/21/2021    NEUTROABS 2.3 02/21/2021   HGB 14.6 02/21/2021   HCT 44.0 02/21/2021   MCV 89.1 02/21/2021   PLT 144 (L) 02/21/2021     Assessment/Plan Glioblastoma, IDH-wildtype ([place]) [C71.9]  Christopher Tucker is clinically stable today.  No issues tolerating Temodar, no cytopenias. He is cleared to dose avastin today.   We recommended continuing treatment with metronomic Temozolomide 50 mg/m2, to be taken daily.  This may be more preferable regimen, as with higher dose Temozolomide he had profound thrombocytopenia. The patient will have a complete blood count performed on days 21 and 28 of each cycle, and a comprehensive metabolic panel performed on day 28 of each cycle. Labs may need to be performed more often. Zofran will prescribed for home use for nausea/vomiting.   Chemotherapy should be held for the following:  ANC less than 1,000  Platelets less than 100,000  LFT or creatinine greater than 2x ULN  If clinical concerns/contraindications develop  We also recommended continuing treatment with Avastin 62m/kg IV q2 weeks.    Avastin should be held for the following:  ANC less than 1500  Platelets less than 50,000  LFT or creatinine greater than 2x ULN  If clinical concerns/contraindications develop  Will decrease Depakote to 5061mBID due to elevated serum level, fatigue, no breathrough seizures.  We ask that MaAbner Ardiseturn to in-person clinic in 2 weeks for next avastin treatment.  Next MRI can be scheduled for 04/12/21.  All questions were answered. The patient knows to call the clinic with any problems, questions or concerns. No barriers to learning were detected.  I have spent a total of 30 minutes of face-to-face and non-face-to-face time, excluding clinical staff time, preparing to see patient, ordering tests and/or medications, counseling the patient, and independently interpreting results and communicating results to the patient/family/caregiver    ZaVentura Sellers MD Medical Director of Neuro-Oncology [facility]6/02/22 12:33 PM

## 2021-02-21 NOTE — Progress Notes (Signed)
Per Dr [name] ok to proceed with Avastin today with labs and vital signs.

## 2021-02-27 ENCOUNTER — Telehealth: Payer: Self-pay | Admitting: Nurse Practitioner

## 2021-02-27 NOTE — Telephone Encounter (Signed)
Rec'd call from patient's wife, [name], needing to reschedule the Palliative f/u visit scheduled for 03/05/21, this was rescheduled for 04/16/21 @ 3:30 as a telehealth visit.

## 2021-03-04 ENCOUNTER — Other Ambulatory Visit: Payer: Self-pay | Admitting: Internal Medicine

## 2021-03-04 DIAGNOSIS — C719 Malignant neoplasm of brain, unspecified: Secondary | ICD-10-CM

## 2021-03-05 ENCOUNTER — Other Ambulatory Visit: Payer: Self-pay | Admitting: Nurse Practitioner

## 2021-03-06 ENCOUNTER — Inpatient Hospital Stay: Payer: 59

## 2021-03-06 ENCOUNTER — Inpatient Hospital Stay (HOSPITAL_BASED_OUTPATIENT_CLINIC_OR_DEPARTMENT_OTHER): Payer: 59 | Admitting: Internal Medicine

## 2021-03-06 ENCOUNTER — Other Ambulatory Visit: Payer: Self-pay

## 2021-03-06 ENCOUNTER — Encounter: Payer: Self-pay | Admitting: Internal Medicine

## 2021-03-06 VITALS — BP 148/94 | HR 63 | Temp 97.5°F | Resp 17 | Ht 71.0 in | Wt 197.9 lb

## 2021-03-06 DIAGNOSIS — Z5112 Encounter for antineoplastic immunotherapy: Secondary | ICD-10-CM | POA: Diagnosis not present

## 2021-03-06 DIAGNOSIS — C719 Malignant neoplasm of brain, unspecified: Secondary | ICD-10-CM

## 2021-03-06 LAB — COMPREHENSIVE METABOLIC PANEL
ALT: 26 U/L (ref 0–44)
AST: 24 U/L (ref 15–41)
Albumin: 3.7 g/dL (ref 3.5–5.0)
Alkaline Phosphatase: 50 U/L (ref 38–126)
Anion gap: 12 (ref 5–15)
BUN: 17 mg/dL (ref 6–20)
CO2: 22 mmol/L (ref 22–32)
Calcium: 9.2 mg/dL (ref 8.9–10.3)
Chloride: 106 mmol/L (ref 98–111)
Creatinine, Ser: 0.86 mg/dL (ref 0.61–1.24)
GFR, Estimated: >60 mL/min (ref >60)
Glucose, Bld: 140 mg/dL — ABNORMAL HIGH (ref 70–99)
Potassium: 4.2 mmol/L (ref 3.5–5.1)
Sodium: 140 mmol/L (ref 135–145)
Total Bilirubin: 0.5 mg/dL (ref 0.3–1.2)
Total Protein: 7 g/dL (ref 6.5–8.1)

## 2021-03-06 LAB — CBC WITH DIFFERENTIAL/PLATELET
Abs Immature Granulocytes: 0.01 10*3/uL (ref 0.00–0.07)
Basophils Absolute: 0.1 10*3/uL (ref 0.0–0.1)
Basophils Relative: 1 %
Eosinophils Absolute: 0.1 10*3/uL (ref 0.0–0.5)
Eosinophils Relative: 1 %
HCT: 45.5 % (ref 39.0–52.0)
Hemoglobin: 15.1 g/dL (ref 13.0–17.0)
Immature Granulocytes: 0 %
Lymphocytes Relative: 29 %
Lymphs Abs: 1.1 10*3/uL (ref 0.7–4.0)
MCH: 29.3 pg (ref 26.0–34.0)
MCHC: 33.2 g/dL (ref 30.0–36.0)
MCV: 88.3 fL (ref 80.0–100.0)
Monocytes Absolute: 0.6 10*3/uL (ref 0.1–1.0)
Monocytes Relative: 14 %
Neutro Abs: 2.1 10*3/uL (ref 1.7–7.7)
Neutrophils Relative %: 55 %
Platelets: 151 10*3/uL (ref 150–400)
RBC: 5.15 MIL/uL (ref 4.22–5.81)
RDW: 13.4 % (ref 11.5–15.5)
WBC: 4 10*3/uL (ref 4.0–10.5)
nRBC: 0 % (ref 0.0–0.2)

## 2021-03-06 LAB — TOTAL PROTEIN, URINE DIPSTICK: Protein, ur: NEGATIVE mg/dL

## 2021-03-06 MED ORDER — SODIUM CHLORIDE 0.9 % IV SOLN
10.0000 mg/kg | Freq: Once | INTRAVENOUS | Status: AC
  Administered 2021-03-06: 900 mg via INTRAVENOUS
  Filled 2021-03-06: qty 32

## 2021-03-06 MED ORDER — SODIUM CHLORIDE 0.9 % IV SOLN
Freq: Once | INTRAVENOUS | Status: AC
  Filled 2021-03-06: qty 250

## 2021-03-06 NOTE — Progress Notes (Signed)
[facility] [place], [place] [address] [phone]   Interval Evaluation  Date of Service: 03/06/21 Patient Name: Christopher Tucker Patient MRN: [id number] Patient DOB: 03-May-1961 Provider: [name], MD  Identifying Statement:  Christopher Tucker is a 60 y.o. 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 Lisbeth Renshaw   07/30/2020 -  Chemotherapy    Patient is on Treatment Plan: BRAIN ANAPLASTIC GLIOMA GRADE III TEMOZOLOMIDE POST XRT Q28D   Patient is on Antibody Plan: BRAIN GBM BEVACIZUMAB 14D X 6 CYCLES     10/18/2020 -  Chemotherapy    Patient is on Treatment Plan: BRAIN ANAPLASTIC GLIOMA GRADE III TEMOZOLOMIDE POST XRT Q28D   Patient is on Antibody Plan: BRAIN GBM BEVACIZUMAB 14D X 6 CYCLES        Biomarkers:  MGMT Unknown.  IDH 1/2 Wild type.  EGFR Unknown  TERT "Mutated   Interval History:  Christopher Tucker presents today for avastin infusion.  Tolerating the addition of Temodar without issue. He does describe an uptick in overall fatigue, he is napping 2 hours each day currently. No significant changes in speech output and finding words.  Gait remains independent.  Denies seizures or headaches.  Depakote had been decreased to 558m BID last visit, no further seizures.  Right arm shoulder pain is static.  Decadron: 10/05/20: 873m1/28/22: 2m8m/24/22: 1mg93m04/22: -  H+P (05/10/20) Patient presented to medical attention in early August with several months history of sensory episodes.  They are described as "numbness marching down right arm also involving the leg, lasting for several minutes".  There is no post event weakness or confusion.  No known history of seizures.  In recent weeks he has also  experienced some difficulty with expressive language, finding particular words and maintaining high fluency.  Since starting Keppra post-operatively he has not had any recurrence of these sensory events.  He has weaned off dexamethasone.  We are still awaiting finalized pathology results.    Medications: Current Outpatient Medications on File Prior to Visit  Medication Sig Dispense Refill   amLODipine (NORVASC) 2.5 MG tablet Take 2.5 mg by mouth daily.     divalproex (DEPAKOTE) 500 MG DR tablet Take 1 tablet (500 mg total) by mouth 2 (two) times daily. 120 tablet 3   dronabinol (MARINOL) 2.5 MG capsule Take 1 capsule (2.5 mg total) by mouth 2 (two) times daily before a meal. (Patient not taking: No sig reported) 60 capsule 2   gabapentin (NEURONTIN) 300 MG capsule Take 1 capsule (300 mg total) by mouth 2 (two) times daily. (Patient not taking: Reported on 02/21/2021) 60 capsule 3   hydrocortisone (CORTEF) 10 MG tablet TAKE 1 TABLET BY MOUTH TWICE A DAY (Patient not taking: Reported on 02/21/2021) 60 tablet 1   ibuprofen (ADVIL) 200 MG tablet Take 400 mg by mouth 2 (two) times daily. (Patient not taking: Reported on 02/21/2021)     lisinopril (ZESTRIL) 20 MG tablet Take 2 tablets (40 mg total) by mouth daily. (Patient not taking: No sig reported) 120 tablet 3   methylphenidate (RITALIN) 5 MG tablet Take 1 tablet (5 mg total) by mouth 2 (two) times daily. (Patient not taking: Reported on 02/21/2021) 60 tablet 0   metoprolol tartrate (LOPRESSOR) 50 MG  tablet Take 1 tablet (50 mg total) by mouth 2 (two) times daily. 60 tablet 3   ondansetron (ZOFRAN) 8 MG tablet Take 1 tablet (8 mg total) by mouth 2 (two) times daily as needed (nausea and vomiting). May take 30-60 minutes prior to Temodar administration if nausea/vomiting occurs. (Patient not taking: No sig reported) 30 tablet 1   sertraline (ZOLOFT) 25 MG tablet TAKE 1 TABLET (25 MG TOTAL) BY MOUTH DAILY. 30 tablet 3   simvastatin (ZOCOR) 40 MG tablet Take 40  mg by mouth at bedtime. (Patient not taking: Reported on 02/21/2021)     temozolomide (TEMODAR) 100 MG capsule TAKE 1 CAPSULE BY MOUTH 1 TIME A DAY AT BEDTIME. MAY TAKE ON AN EMPTY STOMACH TO DECREASE NAUSEA AND VOMITING. 30 capsule 0   No current facility-administered medications on file prior to visit.    Allergies:  Allergies  Allergen Reactions   Pravastatin     Other reaction(s): muscle aches   Past Medical History:  Past Medical History:  Diagnosis Date   High cholesterol    History of kidney stones    Past Surgical History:  Past Surgical History:  Procedure Laterality Date   APPLICATION OF CRANIAL NAVIGATION Left 04/25/2020   Procedure: APPLICATION OF CRANIAL NAVIGATION;  Surgeon: Consuella Lose, MD;  Location: [facility];  Service: Neurosurgery;  Laterality: Left;   CRANIOTOMY Left 04/25/2020   Procedure: STEREOTACTIC LEFT TEMPORAL CRANIOTOMY FOR TUMOR;  Surgeon: Consuella Lose, MD;  Location: [facility];  Service: Neurosurgery;  Laterality: Left;   HERNIA REPAIR Left    groin    Social History:  Social History   Socioeconomic History   Marital status: Married    Spouse name: Not on file   Number of children: Not on file   Years of education: Not on file   Highest education level: Not on file  Occupational History   Not on file  Tobacco Use   Smoking status: Never   Smokeless tobacco: Never  Substance and Sexual Activity   Alcohol use: Yes    Alcohol/week: 6.0 standard drinks    Types: 3 Glasses of wine, 3 Cans of beer per week   Drug use: Not Currently   Sexual activity: Not on file  Other Topics Concern   Not on file  Social History Narrative   Not on file   Social Determinants of Health   Financial Resource Strain: Not on file  Food Insecurity: Not on file  Transportation Needs: Not on file  Physical Activity: Not on file  Stress: Not on file  Social Connections: Not on file  Intimate Partner Violence: Not on file   Family History: No family history  on file.  Review of Systems: Constitutional: Doesn't report fevers, chills or abnormal weight loss Eyes: Doesn't report blurriness of vision Ears, nose, mouth, throat, and face: Doesn't report sore throat Respiratory: Doesn't report cough, dyspnea or wheezes Cardiovascular: Doesn't report palpitation, chest discomfort  Gastrointestinal:  Doesn't report nausea, constipation, diarrhea GU: Doesn't report incontinence Skin: torso rash Neurological: Per HPI Musculoskeletal: Doesn't report joint pain Behavioral/Psych: Doesn't report anxiety  Physical Exam: Vitals:   03/06/21 1419  BP: (!) 148/94  Pulse: 63  Resp: 17  Temp: (!) 97.5 F (36.4 C)  SpO2: 99%   KPS: 70. General: Comfortable, NAD Head: Normal EENT: No conjunctival injection or scleral icterus.  Lungs: Resp effort normal Cardiac: Regular rate Abdomen: Non-distended abdomen Skin: normal Extremities: normal  Neurologic Exam: Mental Status: Awake, alert, attentive to examiner.  Oriented to self and environment. Language c/w transcortical expressive dyshpasia Cranial Nerves: Visual acuity is grossly normal. Right field hemianopia. Extra-ocular movements intact. No ptosis. Face is symmetric Motor: Tone and bulk are normal. 4+/5 in right arm and leg. Reflexes are symmetric, no pathologic reflexes present.  Sensory: Intact to light touch Gait: Dystaxic, hemiparetic  Labs: I have reviewed the data as listed    Component Value Date/Time   NA 143 02/21/2021 1205   K 4.2 02/21/2021 1205   CL 110 02/21/2021 1205   CO2 23 02/21/2021 1205   GLUCOSE 81 02/21/2021 1205   BUN 19 02/21/2021 1205   CREATININE 0.84 02/21/2021 1205   CREATININE 1.07 09/17/2020 0823   CALCIUM 9.1 02/21/2021 1205   PROT 6.6 02/21/2021 1205   ALBUMIN 3.5 02/21/2021 1205   AST 22 02/21/2021 1205   AST 24 09/17/2020 0823   ALT 18 02/21/2021 1205   ALT 50 (H) 09/17/2020 0823   ALKPHOS 50 02/21/2021 1205   BILITOT 0.4 02/21/2021 1205   BILITOT  1.2 09/17/2020 0823   GFRNONAA >60 02/21/2021 1205   GFRNONAA >60 09/17/2020 0823   GFRAA >60 06/14/2020 0828   Lab Results  Component Value Date   WBC 4.4 02/21/2021   NEUTROABS 2.3 02/21/2021   HGB 14.6 02/21/2021   HCT 44.0 02/21/2021   MCV 89.1 02/21/2021   PLT 144 (L) 02/21/2021     Assessment/Plan Glioblastoma, 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  No issues tolerating Temodar, no cytopenias. He is cleared to dose avastin today.   We recommended continuing treatment with metronomic Temozolomide 50 mg/m2, to be taken daily.  This may be more preferable regimen, as with higher dose Temozolomide he had profound thrombocytopenia. The patient will have a complete blood count performed on days 21 and 28 of each cycle, and a comprehensive metabolic panel performed on day 28 of each cycle. Labs may need to be performed more often. Zofran will prescribed for home use for nausea/vomiting.   Chemotherapy should be held for the following:  ANC less than 1,000  Platelets less than 100,000  LFT or creatinine greater than 2x ULN  If clinical concerns/contraindications develop  We also recommended continuing treatment with Avastin 30m/kg IV q2 weeks.    Avastin should be held for the following:  ANC less than 1500  Platelets less than 50,000  LFT or creatinine greater than 2x ULN  If clinical concerns/contraindications develop  Ok to continue Depakote 5023mBID, no breathrough seizures.  We ask that MaYates Weisgerbereturn to in-person clinic in 2 weeks for next avastin treatment.  Next MRI can be scheduled for 04/12/21.  All questions were answered. The patient knows to call the clinic with any problems, questions or concerns. No barriers to learning were detected.  I have spent a total of 30 minutes of face-to-face and non-face-to-face time, excluding clinical staff time, preparing to see patient, ordering tests and/or medications, counseling the  patient, and documenting clinical information in the electronic or other health record    ZaVentura SellersMD Medical Director of Neuro-Oncology [facility]6/15/22 2:11 PM

## 2021-03-06 NOTE — Patient Instructions (Signed)
[facility]  Discharge Instructions: °Thank you for choosing [facility] to provide your oncology and hematology care.  ° °If you have a lab appointment with the Cancer Center, please go directly to the Cancer Center and check in at the registration area. °  °Wear comfortable clothing and clothing appropriate for easy access to any Portacath or PICC line.  ° °We strive to give you quality time with your provider. You may need to reschedule your appointment if you arrive late (15 or more minutes).  Arriving late affects you and other patients whose appointments are after yours.  Also, if you miss three or more appointments without notifying the office, you may be dismissed from the clinic at the provider’s discretion.    °  °For prescription refill requests, have your pharmacy contact our office and allow 72 hours for refills to be completed.   ° °Today you received the following chemotherapy and/or immunotherapy agents: Bevacizumab.     °  °To help prevent nausea and vomiting after your treatment, we encourage you to take your nausea medication as directed. ° °BELOW ARE SYMPTOMS THAT SHOULD BE REPORTED IMMEDIATELY: °*FEVER GREATER THAN 100.4 F (38 °C) OR HIGHER °*CHILLS OR SWEATING °*NAUSEA AND VOMITING THAT IS NOT CONTROLLED WITH YOUR NAUSEA MEDICATION °*UNUSUAL SHORTNESS OF BREATH °*UNUSUAL BRUISING OR BLEEDING °*URINARY PROBLEMS (pain or burning when urinating, or frequent urination) °*BOWEL PROBLEMS (unusual diarrhea, constipation, pain near the anus) °TENDERNESS IN MOUTH AND THROAT WITH OR WITHOUT PRESENCE OF ULCERS (sore throat, sores in mouth, or a toothache) °UNUSUAL RASH, SWELLING OR PAIN  °UNUSUAL VAGINAL DISCHARGE OR ITCHING  ° °Items with * indicate a potential emergency and should be followed up as soon as possible or go to the Emergency Department if any problems should occur. ° °Please show the CHEMOTHERAPY ALERT CARD or IMMUNOTHERAPY ALERT CARD at check-in  to the Emergency Department and triage nurse. ° °Should you have questions after your visit or need to cancel or reschedule your appointment, please contact [facility]  Dept: [phone]  and follow the prompts.  Office hours are 8:00 a.m. to 4:30 p.m. Monday - Friday. Please note that voicemails left after 4:00 p.m. may not be returned until the following business day.  We are closed weekends and major holidays. You have access to a nurse at all times for urgent questions. Please call the main number to the clinic Dept: [phone] and follow the prompts. ° ° °For any non-urgent questions, you may also contact your provider using MyChart. We now offer e-Visits for anyone 18 and older to request care online for non-urgent symptoms. For details visit [name]. °  °Also download the MyChart app! Go to the app store, search "MyChart", open the app, select , and log in with your MyChart username and password. ° °Due to Covid, a mask is required upon entering the hospital/clinic. If you do not have a mask, one will be given to you upon arrival. For doctor visits, patients may have 1 support person aged 18 or older with them. For treatment visits, patients cannot have anyone with them due to current Covid guidelines and our immunocompromised population.  ° °

## 2021-03-08 ENCOUNTER — Telehealth: Payer: Self-pay | Admitting: Internal Medicine

## 2021-03-08 NOTE — Telephone Encounter (Signed)
Scheduled per 06/15 los, patient has been called and voicemail was left.

## 2021-03-14 ENCOUNTER — Encounter: Payer: Self-pay | Admitting: Internal Medicine

## 2021-03-18 ENCOUNTER — Encounter: Payer: Self-pay | Admitting: Internal Medicine

## 2021-03-19 ENCOUNTER — Telehealth: Payer: Self-pay | Admitting: Internal Medicine

## 2021-03-19 NOTE — Telephone Encounter (Signed)
Scheduled per 06/15 los, patient has been called and notified of 06/30 appointment.

## 2021-03-21 ENCOUNTER — Inpatient Hospital Stay: Payer: 59

## 2021-03-21 ENCOUNTER — Inpatient Hospital Stay (HOSPITAL_BASED_OUTPATIENT_CLINIC_OR_DEPARTMENT_OTHER): Payer: 59 | Admitting: Internal Medicine

## 2021-03-21 ENCOUNTER — Other Ambulatory Visit: Payer: Self-pay

## 2021-03-21 VITALS — BP 132/94 | HR 63 | Temp 97.3°F | Resp 18 | Ht 71.0 in | Wt 196.0 lb

## 2021-03-21 DIAGNOSIS — C719 Malignant neoplasm of brain, unspecified: Secondary | ICD-10-CM

## 2021-03-21 DIAGNOSIS — Z5112 Encounter for antineoplastic immunotherapy: Secondary | ICD-10-CM | POA: Diagnosis not present

## 2021-03-21 LAB — COMPREHENSIVE METABOLIC PANEL
ALT: 17 U/L (ref 0–44)
AST: 18 U/L (ref 15–41)
Albumin: 3.5 g/dL (ref 3.5–5.0)
Alkaline Phosphatase: 43 U/L (ref 38–126)
Anion gap: 10 (ref 5–15)
BUN: 20 mg/dL (ref 6–20)
CO2: 21 mmol/L — ABNORMAL LOW (ref 22–32)
Calcium: 9.2 mg/dL (ref 8.9–10.3)
Chloride: 111 mmol/L (ref 98–111)
Creatinine, Ser: 0.82 mg/dL (ref 0.61–1.24)
GFR, Estimated: >60 mL/min (ref >60)
Glucose, Bld: 112 mg/dL — ABNORMAL HIGH (ref 70–99)
Potassium: 4.2 mmol/L (ref 3.5–5.1)
Sodium: 142 mmol/L (ref 135–145)
Total Bilirubin: 0.5 mg/dL (ref 0.3–1.2)
Total Protein: 6.7 g/dL (ref 6.5–8.1)

## 2021-03-21 LAB — CBC WITH DIFFERENTIAL/PLATELET
Abs Immature Granulocytes: 0.03 10*3/uL (ref 0.00–0.07)
Basophils Absolute: 0 10*3/uL (ref 0.0–0.1)
Basophils Relative: 1 %
Eosinophils Absolute: 0.1 10*3/uL (ref 0.0–0.5)
Eosinophils Relative: 3 %
HCT: 43.9 % (ref 39.0–52.0)
Hemoglobin: 14.9 g/dL (ref 13.0–17.0)
Immature Granulocytes: 1 %
Lymphocytes Relative: 22 %
Lymphs Abs: 1 10*3/uL (ref 0.7–4.0)
MCH: 29.6 pg (ref 26.0–34.0)
MCHC: 33.9 g/dL (ref 30.0–36.0)
MCV: 87.1 fL (ref 80.0–100.0)
Monocytes Absolute: 0.7 10*3/uL (ref 0.1–1.0)
Monocytes Relative: 16 %
Neutro Abs: 2.5 10*3/uL (ref 1.7–7.7)
Neutrophils Relative %: 57 %
Platelets: 152 10*3/uL (ref 150–400)
RBC: 5.04 MIL/uL (ref 4.22–5.81)
RDW: 13.3 % (ref 11.5–15.5)
WBC: 4.4 10*3/uL (ref 4.0–10.5)
nRBC: 0 % (ref 0.0–0.2)

## 2021-03-21 LAB — TOTAL PROTEIN, URINE DIPSTICK: Protein, ur: NEGATIVE mg/dL

## 2021-03-21 MED ORDER — SODIUM CHLORIDE 0.9 % IV SOLN
Freq: Once | INTRAVENOUS | Status: AC
Start: 2021-03-21 — End: 2021-03-21
  Filled 2021-03-21: qty 250

## 2021-03-21 MED ORDER — HEPARIN SOD (PORK) LOCK FLUSH 100 UNIT/ML IV SOLN
500.0000 [IU] | Freq: Once | INTRAVENOUS | Status: DC | PRN
  Filled 2021-03-21: qty 5

## 2021-03-21 MED ORDER — SODIUM CHLORIDE 0.9 % IV SOLN
10.0000 mg/kg | Freq: Once | INTRAVENOUS | Status: AC
  Administered 2021-03-21: 900 mg via INTRAVENOUS
  Filled 2021-03-21: qty 32

## 2021-03-21 MED ORDER — TEMOZOLOMIDE 100 MG PO CAPS: 100.0000 mg | ORAL_CAPSULE | Freq: Every day | ORAL | 0 refills | Status: DC

## 2021-03-21 NOTE — Patient Instructions (Signed)
[facility]  Discharge Instructions: Thank you for choosing [facility] to provide your oncology and hematology care.   If you have a lab appointment with the Cancer Center, please go directly to the Cancer Center and check in at the registration area.   Wear comfortable clothing and clothing appropriate for easy access to any Portacath or PICC line.   We strive to give you quality time with your provider. You may need to reschedule your appointment if you arrive late (15 or more minutes).  Arriving late affects you and other patients whose appointments are after yours.  Also, if you miss three or more appointments without notifying the office, you may be dismissed from the clinic at the provider's discretion.      For prescription refill requests, have your pharmacy contact our office and allow 72 hours for refills to be completed.    Today you received the following chemotherapy and/or immunotherapy agents Avastin       To help prevent nausea and vomiting after your treatment, we encourage you to take your nausea medication as directed.  BELOW ARE SYMPTOMS THAT SHOULD BE REPORTED IMMEDIATELY: *FEVER GREATER THAN 100.4 F (38 C) OR HIGHER *CHILLS OR SWEATING *NAUSEA AND VOMITING THAT IS NOT CONTROLLED WITH YOUR NAUSEA MEDICATION *UNUSUAL SHORTNESS OF BREATH *UNUSUAL BRUISING OR BLEEDING *URINARY PROBLEMS (pain or burning when urinating, or frequent urination) *BOWEL PROBLEMS (unusual diarrhea, constipation, pain near the anus) TENDERNESS IN MOUTH AND THROAT WITH OR WITHOUT PRESENCE OF ULCERS (sore throat, sores in mouth, or a toothache) UNUSUAL RASH, SWELLING OR PAIN  UNUSUAL VAGINAL DISCHARGE OR ITCHING   Items with * indicate a potential emergency and should be followed up as soon as possible or go to the Emergency Department if any problems should occur.  Please show the CHEMOTHERAPY ALERT CARD or IMMUNOTHERAPY ALERT CARD at check-in to  the Emergency Department and triage nurse.  Should you have questions after your visit or need to cancel or reschedule your appointment, please contact [facility]  Dept: [phone]  and follow the prompts.  Office hours are 8:00 a.m. to 4:30 p.m. Monday - Friday. Please note that voicemails left after 4:00 p.m. may not be returned until the following business day.  We are closed weekends and major holidays. You have access to a nurse at all times for urgent questions. Please call the main number to the clinic Dept: [phone] and follow the prompts.   For any non-urgent questions, you may also contact your provider using MyChart. We now offer e-Visits for anyone 18 and older to request care online for non-urgent symptoms. For details visit [email or web address].   Also download the MyChart app! Go to the app store, search "MyChart", open the app, select [facility], and log in with your MyChart username and password.  Due to Covid, a mask is required upon entering the hospital/clinic. If you do not have a mask, one will be given to you upon arrival. For doctor visits, patients may have 1 support person aged 18 or older with them. For treatment visits, patients cannot have anyone with them due to current Covid guidelines and our immunocompromised population.   

## 2021-03-21 NOTE — Progress Notes (Signed)
[facility] [place], [place] [address] [phone]   Interval Evaluation  Date of Service: 03/21/21 Patient Name: Christopher Tucker Patient MRN: [id number] Patient DOB: [birth year] Provider: [name], MD  Identifying Statement:  Christopher Tucker is a 60 y.o. male with left temporal anaplastic astrocytoma   Oncologic History: Oncology History  Glioblastoma, IDH-wildtype ([place])  05/14/2020 Initial Diagnosis   Anaplastic astrocytoma, IDH-wildtype ([name])    05/21/2020 - 07/02/2020 Radiation Therapy   IMRT and concurrent Temodar with Dr. Lisbeth Renshaw   07/30/2020 -  Chemotherapy    Patient is on Treatment Plan: BRAIN ANAPLASTIC GLIOMA GRADE III TEMOZOLOMIDE POST XRT Q28D   Patient is on Antibody Plan: BRAIN GBM BEVACIZUMAB 14D X 6 CYCLES     10/18/2020 -  Chemotherapy    Patient is on Treatment Plan: BRAIN ANAPLASTIC GLIOMA GRADE III TEMOZOLOMIDE POST XRT Q28D   Patient is on Antibody Plan: BRAIN GBM BEVACIZUMAB 14D X 6 CYCLES        Biomarkers:  MGMT Unknown.  IDH 1/2 Wild type.  EGFR Unknown  TERT "Mutated   Interval History:  [name] presents today for avastin infusion.  Tolerating the Temodar well, despite some increase in fatigue.  Continues daily nap. No significant changes in speech output and finding words.  Gait remains independent.  Denies seizures or headaches.  Depakote still at 500mg  twice per day, no further seizures.  Right arm shoulder pain is static.  Decadron: 10/05/20: 8mg  10/19/20: 4mg  11/15/20: 1mg  11/23/20: -  H+P (05/10/20) Patient presented to medical attention in early August with several months history of sensory episodes.  They are described as "numbness marching down right arm also involving the leg, lasting for several minutes".  There is no post event weakness or confusion.  No known history of seizures.  In recent weeks he has also experienced some difficulty with expressive language, finding  particular words and maintaining high fluency.  Since starting Keppra post-operatively he has not had any recurrence of these sensory events.  He has weaned off dexamethasone.  We are still awaiting finalized pathology results.    Medications: Current Outpatient Medications on File Prior to Visit  Medication Sig Dispense Refill   amLODipine (NORVASC) 2.5 MG tablet Take 2.5 mg by mouth daily.     divalproex (DEPAKOTE) 500 MG DR tablet Take 1 tablet (500 mg total) by mouth 2 (two) times daily. 120 tablet 3   metoprolol tartrate (LOPRESSOR) 50 MG tablet Take 1 tablet (50 mg total) by mouth 2 (two) times daily. 60 tablet 3   sertraline (ZOLOFT) 25 MG tablet TAKE 1 TABLET (25 MG TOTAL) BY MOUTH DAILY. 30 tablet 3   dronabinol (MARINOL) 2.5 MG capsule Take 1 capsule (2.5 mg total) by mouth 2 (two) times daily before a meal. (Patient not taking: Reported on 03/21/2021) 60 capsule 2   gabapentin (NEURONTIN) 300 MG capsule Take 1 capsule (300 mg total) by mouth 2 (two) times daily. (Patient not taking: Reported on 03/21/2021) 60 capsule 3   hydrocortisone (CORTEF) 10 MG tablet TAKE 1 TABLET BY MOUTH TWICE A DAY (Patient not taking: Reported on 03/21/2021) 60 tablet 1   ibuprofen (ADVIL) 200 MG tablet Take 400 mg by mouth 2 (two) times daily. (Patient not taking: Reported on 03/21/2021)     lisinopril (ZESTRIL) 20 MG tablet Take 2 tablets (40 mg total) by mouth daily. (Patient not taking: Reported on 03/21/2021) 120 tablet 3   methylphenidate (RITALIN) 5  MG tablet Take 1 tablet (5 mg total) by mouth 2 (two) times daily. (Patient not taking: Reported on 03/21/2021) 60 tablet 0   ondansetron (ZOFRAN) 8 MG tablet Take 1 tablet (8 mg total) by mouth 2 (two) times daily as needed (nausea and vomiting). May take 30-60 minutes prior to Temodar administration if nausea/vomiting occurs. (Patient not taking: Reported on 03/21/2021) 30 tablet 1   simvastatin (ZOCOR) 40 MG tablet Take 40 mg by mouth at bedtime. (Patient not  taking: Reported on 03/21/2021)     temozolomide (TEMODAR) 100 MG capsule TAKE 1 CAPSULE BY MOUTH 1 TIME A DAY AT BEDTIME. MAY TAKE ON AN EMPTY STOMACH TO DECREASE NAUSEA AND VOMITING. (Patient not taking: Reported on 03/21/2021) 30 capsule 0   No current facility-administered medications on file prior to visit.    Allergies:  Allergies  Allergen Reactions   Pravastatin     Other reaction(s): muscle aches   Past Medical History:  Past Medical History:  Diagnosis Date   High cholesterol    History of kidney stones    Past Surgical History:  Past Surgical History:  Procedure Laterality Date   APPLICATION OF CRANIAL NAVIGATION Left 04/25/2020   Procedure: APPLICATION OF CRANIAL NAVIGATION;  Surgeon: Consuella Lose, MD;  Location: [facility];  Service: Neurosurgery;  Laterality: Left;   CRANIOTOMY Left 04/25/2020   Procedure: STEREOTACTIC LEFT TEMPORAL CRANIOTOMY FOR TUMOR;  Surgeon: Consuella Lose, MD;  Location: [facility];  Service: Neurosurgery;  Laterality: Left;   HERNIA REPAIR Left    groin    Social History:  Social History   Socioeconomic History   Marital status: Married    Spouse name: Not on file   Number of children: Not on file   Years of education: Not on file   Highest education level: Not on file  Occupational History   Not on file  Tobacco Use   Smoking status: Never   Smokeless tobacco: Never  Substance and Sexual Activity   Alcohol use: Yes    Alcohol/week: 6.0 standard drinks    Types: 3 Glasses of wine, 3 Cans of beer per week   Drug use: Not Currently   Sexual activity: Not on file  Other Topics Concern   Not on file  Social History Narrative   Not on file   Social Determinants of Health   Financial Resource Strain: Not on file  Food Insecurity: Not on file  Transportation Needs: Not on file  Physical Activity: Not on file  Stress: Not on file  Social Connections: Not on file  Intimate Partner Violence: Not on file   Family History: No family  history on file.  Review of Systems: Constitutional: Doesn't report fevers, chills or abnormal weight loss Eyes: Doesn't report blurriness of vision Ears, nose, mouth, throat, and face: Doesn't report sore throat Respiratory: Doesn't report cough, dyspnea or wheezes Cardiovascular: Doesn't report palpitation, chest discomfort  Gastrointestinal:  Doesn't report nausea, constipation, diarrhea GU: Doesn't report incontinence Skin: torso rash Neurological: Per HPI Musculoskeletal: Doesn't report joint pain Behavioral/Psych: Doesn't report anxiety  Physical Exam: Vitals:   03/21/21 1213  BP: (!) 132/94  Pulse: 63  Resp: 18  Temp: (!) 97.3 F (36.3 C)  SpO2: 99%   KPS: 70. General: Comfortable, NAD Head: Normal EENT: No conjunctival injection or scleral icterus.  Lungs: Resp effort normal Cardiac: Regular rate Abdomen: Non-distended abdomen Skin: normal Extremities: normal  Neurologic Exam: Mental Status: Awake, alert, attentive to examiner. Oriented to self and environment. Language  c/w transcortical expressive dyshpasia Cranial Nerves: Visual acuity is grossly normal. Right field hemianopia. Extra-ocular movements intact. No ptosis. Face is symmetric Motor: Tone and bulk are normal. 4+/5 in right arm and leg. Reflexes are symmetric, no pathologic reflexes present.  Sensory: Intact to light touch Gait: Dystaxic, hemiparetic  Labs: I have reviewed the data as listed    Component Value Date/Time   NA 142 03/21/2021 1204   K 4.2 03/21/2021 1204   CL 111 03/21/2021 1204   CO2 21 (L) 03/21/2021 1204   GLUCOSE 112 (H) 03/21/2021 1204   BUN 20 03/21/2021 1204   CREATININE 0.82 03/21/2021 1204   CREATININE 1.07 09/17/2020 0823   CALCIUM 9.2 03/21/2021 1204   PROT 6.7 03/21/2021 1204   ALBUMIN 3.5 03/21/2021 1204   AST 18 03/21/2021 1204   AST 24 09/17/2020 0823   ALT 17 03/21/2021 1204   ALT 50 (H) 09/17/2020 0823   ALKPHOS 43 03/21/2021 1204   BILITOT 0.5 03/21/2021  1204   BILITOT 1.2 09/17/2020 0823   GFRNONAA >60 03/21/2021 1204   GFRNONAA >60 09/17/2020 0823   GFRAA >60 06/14/2020 0828   Lab Results  Component Value Date   WBC 4.4 03/21/2021   NEUTROABS 2.5 03/21/2021   HGB 14.9 03/21/2021   HCT 43.9 03/21/2021   MCV 87.1 03/21/2021   PLT 152 03/21/2021     Assessment/Plan Glioblastoma, IDH-wildtype ([place]) [C71.9]  Oniel Meleski is clinically stable today.  Labs are within normal limits, he is safe to dose avastin.  We recommended continuing treatment with metronomic Temozolomide 50 mg/m2, to be taken daily.  This may be more preferable regimen, as with higher dose Temozolomide he had profound thrombocytopenia. The patient will have a complete blood count performed on days 21 and 28 of each cycle, and a comprehensive metabolic panel performed on day 28 of each cycle. Labs may need to be performed more often. Zofran will prescribed for home use for nausea/vomiting.   Chemotherapy should be held for the following:  ANC less than 1,000  Platelets less than 100,000  LFT or creatinine greater than 2x ULN  If clinical concerns/contraindications develop  We also recommended continuing treatment with Avastin 74m/kg IV q2 weeks.    Avastin should be held for the following:  ANC less than 1500  Platelets less than 50,000  LFT or creatinine greater than 2x ULN  If clinical concerns/contraindications develop  Ok to continue Depakote 5036mBID, no breathrough seizures.  We ask that MaCarl Butnereturn to in-person clinic in 2 weeks for next avastin treatment.  Next MRI can be scheduled for 04/12/21.  All questions were answered. The patient knows to call the clinic with any problems, questions or concerns. No barriers to learning were detected.  I have spent a total of 30 minutes of face-to-face and non-face-to-face time, excluding clinical staff time, preparing to see patient, ordering tests and/or medications, counseling the patient, and  documenting clinical information in the electronic or other health record    ZaVentura SellersMD Medical Director of Neuro-Oncology [facility]6/30/22 12:42 PM

## 2021-03-26 ENCOUNTER — Other Ambulatory Visit: Payer: Self-pay | Admitting: Radiation Therapy

## 2021-04-01 ENCOUNTER — Encounter: Payer: Self-pay | Admitting: Internal Medicine

## 2021-04-04 ENCOUNTER — Inpatient Hospital Stay (HOSPITAL_BASED_OUTPATIENT_CLINIC_OR_DEPARTMENT_OTHER): Payer: 59 | Admitting: Internal Medicine

## 2021-04-04 ENCOUNTER — Other Ambulatory Visit: Payer: Self-pay

## 2021-04-04 ENCOUNTER — Inpatient Hospital Stay: Payer: 59 | Attending: Neurosurgery

## 2021-04-04 ENCOUNTER — Inpatient Hospital Stay: Payer: 59

## 2021-04-04 VITALS — BP 135/96 | HR 53 | Temp 98.3°F | Resp 17 | Ht 71.0 in | Wt 196.6 lb

## 2021-04-04 VITALS — BP 149/90 | HR 51 | Resp 16

## 2021-04-04 DIAGNOSIS — C719 Malignant neoplasm of brain, unspecified: Secondary | ICD-10-CM

## 2021-04-04 DIAGNOSIS — Z923 Personal history of irradiation: Secondary | ICD-10-CM | POA: Diagnosis not present

## 2021-04-04 DIAGNOSIS — Z7952 Long term (current) use of systemic steroids: Secondary | ICD-10-CM | POA: Diagnosis not present

## 2021-04-04 DIAGNOSIS — Z79899 Other long term (current) drug therapy: Secondary | ICD-10-CM | POA: Diagnosis not present

## 2021-04-04 DIAGNOSIS — I10 Essential (primary) hypertension: Secondary | ICD-10-CM | POA: Insufficient documentation

## 2021-04-04 DIAGNOSIS — Z87442 Personal history of urinary calculi: Secondary | ICD-10-CM | POA: Diagnosis not present

## 2021-04-04 DIAGNOSIS — Z5112 Encounter for antineoplastic immunotherapy: Secondary | ICD-10-CM | POA: Diagnosis not present

## 2021-04-04 LAB — COMPREHENSIVE METABOLIC PANEL
ALT: 25 U/L (ref 0–44)
AST: 22 U/L (ref 15–41)
Albumin: 3.4 g/dL — ABNORMAL LOW (ref 3.5–5.0)
Alkaline Phosphatase: 41 U/L (ref 38–126)
Anion gap: 10 (ref 5–15)
BUN: 17 mg/dL (ref 6–20)
CO2: 22 mmol/L (ref 22–32)
Calcium: 9.2 mg/dL (ref 8.9–10.3)
Chloride: 109 mmol/L (ref 98–111)
Creatinine, Ser: 0.87 mg/dL (ref 0.61–1.24)
GFR, Estimated: >60 mL/min (ref >60)
Glucose, Bld: 113 mg/dL — ABNORMAL HIGH (ref 70–99)
Potassium: 4.4 mmol/L (ref 3.5–5.1)
Sodium: 141 mmol/L (ref 135–145)
Total Bilirubin: 0.8 mg/dL (ref 0.3–1.2)
Total Protein: 6.8 g/dL (ref 6.5–8.1)

## 2021-04-04 LAB — CBC WITH DIFFERENTIAL/PLATELET
Abs Immature Granulocytes: 0.02 10*3/uL (ref 0.00–0.07)
Basophils Absolute: 0 10*3/uL (ref 0.0–0.1)
Basophils Relative: 1 %
Eosinophils Absolute: 0.2 10*3/uL (ref 0.0–0.5)
Eosinophils Relative: 5 %
HCT: 45.9 % (ref 39.0–52.0)
Hemoglobin: 15.7 g/dL (ref 13.0–17.0)
Immature Granulocytes: 1 %
Lymphocytes Relative: 26 %
Lymphs Abs: 1 10*3/uL (ref 0.7–4.0)
MCH: 29.8 pg (ref 26.0–34.0)
MCHC: 34.2 g/dL (ref 30.0–36.0)
MCV: 87.3 fL (ref 80.0–100.0)
Monocytes Absolute: 0.7 10*3/uL (ref 0.1–1.0)
Monocytes Relative: 18 %
Neutro Abs: 2 10*3/uL (ref 1.7–7.7)
Neutrophils Relative %: 49 %
Platelets: 158 10*3/uL (ref 150–400)
RBC: 5.26 MIL/uL (ref 4.22–5.81)
RDW: 13.5 % (ref 11.5–15.5)
WBC: 3.9 10*3/uL — ABNORMAL LOW (ref 4.0–10.5)
nRBC: 0 % (ref 0.0–0.2)

## 2021-04-04 LAB — TOTAL PROTEIN, URINE DIPSTICK: Protein, ur: <30 mg/dL

## 2021-04-04 MED ORDER — ONDANSETRON HCL 8 MG PO TABS: 8.0000 mg | ORAL_TABLET | Freq: Two times a day (BID) | ORAL | 0 refills | Status: DC | PRN

## 2021-04-04 MED ORDER — SODIUM CHLORIDE 0.9 % IV SOLN
10.0000 mg/kg | Freq: Once | INTRAVENOUS | Status: AC
  Administered 2021-04-04: 900 mg via INTRAVENOUS
  Filled 2021-04-04: qty 32

## 2021-04-04 MED ORDER — SODIUM CHLORIDE 0.9 % IV SOLN
Freq: Once | INTRAVENOUS | Status: AC
Start: 2021-04-04 — End: 2021-04-04
  Filled 2021-04-04: qty 250

## 2021-04-04 NOTE — Progress Notes (Signed)
[facility] [place], [address] [address] [phone]   Interval Evaluation  Date of Service: 04/04/21 Patient Name: Christopher Tucker Patient MRN: [id number] Patient DOB: Jul 09, 1961 Provider: [name], MD  Identifying Statement:  Christopher Tucker is a 60 y.o. male with left temporal anaplastic astrocytoma   Oncologic History: Oncology History  Glioblastoma, IDH-wildtype ([place])  05/14/2020 Initial Diagnosis   Anaplastic astrocytoma, IDH-wildtype ([place])    05/21/2020 - 07/02/2020 Radiation Therapy   IMRT and concurrent Temodar with Dr. Lisbeth Renshaw   07/30/2020 -  Chemotherapy    Patient is on Treatment Plan: BRAIN ANAPLASTIC GLIOMA GRADE III TEMOZOLOMIDE POST XRT Q28D   Patient is on Antibody Plan: BRAIN GBM BEVACIZUMAB 14D X 6 CYCLES     10/18/2020 -  Chemotherapy    Patient is on Treatment Plan: BRAIN ANAPLASTIC GLIOMA GRADE III TEMOZOLOMIDE POST XRT Q28D   Patient is on Antibody Plan: BRAIN GBM BEVACIZUMAB 14D X 6 CYCLES        Biomarkers:  MGMT Unknown.  IDH 1/2 Wild type.  EGFR Unknown  TERT "Mutated   Interval History:  Christopher Tucker presents today for avastin infusion.  This week he visited the eye doctor due to ongoing blurriness in right eye, cataract was visualized.  Otherwise tolerating the Temodar well, no issues. No significant changes in speech output and finding words.  Gait remains independent.  Denies seizures or headaches.  Depakote still at 518m twice per day, no further seizures.  Right arm shoulder pain is static.  Decadron: 10/05/20: 873m1/28/22: 36m39m/24/22: 1mg48m04/22: -  H+P (05/10/20) Patient presented to medical attention in early August with several months history of sensory episodes.  They are described as "numbness marching down right arm also involving the leg, lasting for several minutes".  There is no post event weakness or confusion.  No known history of seizures.  In recent weeks he has also  experienced some difficulty with expressive language, finding particular words and maintaining high fluency.  Since starting Keppra post-operatively he has not had any recurrence of these sensory events.  He has weaned off dexamethasone.  We are still awaiting finalized pathology results.    Medications: Current Outpatient Medications on File Prior to Visit  Medication Sig Dispense Refill   amLODipine (NORVASC) 2.5 MG tablet Take 2.5 mg by mouth daily.     divalproex (DEPAKOTE) 500 MG DR tablet Take 1 tablet (500 mg total) by mouth 2 (two) times daily. 120 tablet 3   dronabinol (MARINOL) 2.5 MG capsule Take 1 capsule (2.5 mg total) by mouth 2 (two) times daily before a meal. (Patient not taking: Reported on 03/21/2021) 60 capsule 2   gabapentin (NEURONTIN) 300 MG capsule Take 1 capsule (300 mg total) by mouth 2 (two) times daily. (Patient not taking: Reported on 03/21/2021) 60 capsule 3   hydrocortisone (CORTEF) 10 MG tablet TAKE 1 TABLET BY MOUTH TWICE A DAY (Patient not taking: Reported on 03/21/2021) 60 tablet 1   ibuprofen (ADVIL) 200 MG tablet Take 400 mg by mouth 2 (two) times daily. (Patient not taking: Reported on 03/21/2021)     lisinopril (ZESTRIL) 20 MG tablet Take 2 tablets (40 mg total) by mouth daily. (Patient not taking: Reported on 03/21/2021) 120 tablet 3   methylphenidate (RITALIN) 5 MG tablet Take 1 tablet (5 mg total) by mouth 2 (two) times daily. (Patient not taking: Reported on 03/21/2021) 60 tablet 0   metoprolol tartrate (LOPRESSOR) 50 MG  tablet Take 1 tablet (50 mg total) by mouth 2 (two) times daily. 60 tablet 3   ondansetron (ZOFRAN) 8 MG tablet Take 1 tablet (8 mg total) by mouth 2 (two) times daily as needed (nausea and vomiting). May take 30-60 minutes prior to Temodar administration if nausea/vomiting occurs. (Patient not taking: Reported on 03/21/2021) 30 tablet 1   sertraline (ZOLOFT) 25 MG tablet TAKE 1 TABLET (25 MG TOTAL) BY MOUTH DAILY. 30 tablet 3   simvastatin (ZOCOR)  40 MG tablet Take 40 mg by mouth at bedtime. (Patient not taking: Reported on 03/21/2021)     temozolomide (TEMODAR) 100 MG capsule TAKE 1 CAPSULE BY MOUTH 1 TIME A DAY AT BEDTIME. MAY TAKE ON AN EMPTY STOMACH TO DECREASE NAUSEA AND VOMITING. (Patient not taking: Reported on 03/21/2021) 30 capsule 0   temozolomide (TEMODAR) 100 MG capsule Take 1 capsule (100 mg total) by mouth daily. 30 capsule 0   No current facility-administered medications on file prior to visit.    Allergies:  Allergies  Allergen Reactions   Pravastatin     Other reaction(s): muscle aches   Past Medical History:  Past Medical History:  Diagnosis Date   High cholesterol    History of kidney stones    Past Surgical History:  Past Surgical History:  Procedure Laterality Date   APPLICATION OF CRANIAL NAVIGATION Left 04/25/2020   Procedure: APPLICATION OF CRANIAL NAVIGATION;  Surgeon: Consuella Lose, MD;  Location: [facility];  Service: Neurosurgery;  Laterality: Left;   CRANIOTOMY Left 04/25/2020   Procedure: STEREOTACTIC LEFT TEMPORAL CRANIOTOMY FOR TUMOR;  Surgeon: Consuella Lose, MD;  Location: [facility];  Service: Neurosurgery;  Laterality: Left;   HERNIA REPAIR Left    groin    Social History:  Social History   Socioeconomic History   Marital status: Married    Spouse name: Not on file   Number of children: Not on file   Years of education: Not on file   Highest education level: Not on file  Occupational History   Not on file  Tobacco Use   Smoking status: Never   Smokeless tobacco: Never  Substance and Sexual Activity   Alcohol use: Yes    Alcohol/week: 6.0 standard drinks    Types: 3 Glasses of wine, 3 Cans of beer per week   Drug use: Not Currently   Sexual activity: Not on file  Other Topics Concern   Not on file  Social History Narrative   Not on file   Social Determinants of Health   Financial Resource Strain: Not on file  Food Insecurity: Not on file  Transportation Needs: Not on file   Physical Activity: Not on file  Stress: Not on file  Social Connections: Not on file  Intimate Partner Violence: Not on file   Family History: No family history on file.  Review of Systems: Constitutional: Doesn't report fevers, chills or abnormal weight loss Eyes: Doesn't report blurriness of vision Ears, nose, mouth, throat, and face: Doesn't report sore throat Respiratory: Doesn't report cough, dyspnea or wheezes Cardiovascular: Doesn't report palpitation, chest discomfort  Gastrointestinal:  Doesn't report nausea, constipation, diarrhea GU: Doesn't report incontinence Skin: torso rash Neurological: Per HPI Musculoskeletal: Doesn't report joint pain Behavioral/Psych: Doesn't report anxiety  Physical Exam: Vitals:   04/04/21 0916  BP: (!) 135/96  Pulse: (!) 53  Resp: 17  Temp: 98.3 F (36.8 C)  SpO2: 98%    KPS: 70. General: Comfortable, NAD Head: Normal EENT: No conjunctival injection or scleral  icterus.  Lungs: Resp effort normal Cardiac: Regular rate Abdomen: Non-distended abdomen Skin: normal Extremities: normal  Neurologic Exam: Mental Status: Awake, alert, attentive to examiner. Oriented to self and environment. Language c/w transcortical expressive dyshpasia Cranial Nerves: Visual acuity is grossly normal. Right field hemianopia. Extra-ocular movements intact. No ptosis. Face is symmetric Motor: Tone and bulk are normal. 4+/5 in right arm and leg. Reflexes are symmetric, no pathologic reflexes present.  Sensory: Intact to light touch Gait: Dystaxic, hemiparetic  Labs: I have reviewed the data as listed    Component Value Date/Time   NA 142 03/21/2021 1204   K 4.2 03/21/2021 1204   CL 111 03/21/2021 1204   CO2 21 (L) 03/21/2021 1204   GLUCOSE 112 (H) 03/21/2021 1204   BUN 20 03/21/2021 1204   CREATININE 0.82 03/21/2021 1204   CREATININE 1.07 09/17/2020 0823   CALCIUM 9.2 03/21/2021 1204   PROT 6.7 03/21/2021 1204   ALBUMIN 3.5 03/21/2021 1204    AST 18 03/21/2021 1204   AST 24 09/17/2020 0823   ALT 17 03/21/2021 1204   ALT 50 (H) 09/17/2020 0823   ALKPHOS 43 03/21/2021 1204   BILITOT 0.5 03/21/2021 1204   BILITOT 1.2 09/17/2020 0823   GFRNONAA >60 03/21/2021 1204   GFRNONAA >60 09/17/2020 0823   GFRAA >60 06/14/2020 0828   Lab Results  Component Value Date   WBC 3.9 (L) 04/04/2021   NEUTROABS 2.0 04/04/2021   HGB 15.7 04/04/2021   HCT 45.9 04/04/2021   MCV 87.3 04/04/2021   PLT 158 04/04/2021     Assessment/Plan Glioblastoma, IDH-wildtype ([place]) [C71.9]  Christopher Tucker is clinically stable today.  Labs are within normal limits, he is safe to dose avastin.  No new or progressive deficits.  We recommended continuing treatment with metronomic Temozolomide 50 mg/m2, to be taken daily.  This may be more preferable regimen, as with higher dose Temozolomide he had profound thrombocytopenia. The patient will have a complete blood count performed on days 21 and 28 of each cycle, and a comprehensive metabolic panel performed on day 28 of each cycle. Labs may need to be performed more often. Zofran will prescribed for home use for nausea/vomiting.   Chemotherapy should be held for the following:  ANC less than 1,000  Platelets less than 100,000  LFT or creatinine greater than 2x ULN  If clinical concerns/contraindications develop  We also recommended continuing treatment with Avastin 3m/kg IV q2 weeks.    Avastin should be held for the following:  ANC less than 1500  Platelets less than 50,000  LFT or creatinine greater than 2x ULN  If clinical concerns/contraindications develop  Ok to continue Depakote 5032mBID, no breathrough seizures.  We ask that Christopher Tucker to in-person clinic in 2 weeks following MRI brain.  All questions were answered. The patient knows to call the clinic with any problems, questions or concerns. No barriers to learning were detected.  I have spent a total of 30 minutes of  face-to-face and non-face-to-face time, excluding clinical staff time, preparing to see patient, ordering tests and/or medications, counseling the patient, and documenting clinical information in the electronic or other health record    ZaVentura SellersMD Medical Director of Neuro-Oncology [facility]7/14/22 9:15 AM

## 2021-04-04 NOTE — Patient Instructions (Signed)
[facility] MEDICAL ONCOLOGY  Discharge Instructions: °Thank you for choosing [facility] to provide your oncology and hematology care.  ° °If you have a lab appointment with the Cancer Center, please go directly to the Cancer Center and check in at the registration area. °  °Wear comfortable clothing and clothing appropriate for easy access to any Portacath or PICC line.  ° °We strive to give you quality time with your provider. You may need to reschedule your appointment if you arrive late (15 or more minutes).  Arriving late affects you and other patients whose appointments are after yours.  Also, if you miss three or more appointments without notifying the office, you may be dismissed from the clinic at the provider’s discretion.    °  °For prescription refill requests, have your pharmacy contact our office and allow 72 hours for refills to be completed.   ° °Today you received the following chemotherapy and/or immunotherapy agents: bevacizumab    °  °To help prevent nausea and vomiting after your treatment, we encourage you to take your nausea medication as directed. ° °BELOW ARE SYMPTOMS THAT SHOULD BE REPORTED IMMEDIATELY: °*FEVER GREATER THAN 100.4 F (38 °C) OR HIGHER °*CHILLS OR SWEATING °*NAUSEA AND VOMITING THAT IS NOT CONTROLLED WITH YOUR NAUSEA MEDICATION °*UNUSUAL SHORTNESS OF BREATH °*UNUSUAL BRUISING OR BLEEDING °*URINARY PROBLEMS (pain or burning when urinating, or frequent urination) °*BOWEL PROBLEMS (unusual diarrhea, constipation, pain near the anus) °TENDERNESS IN MOUTH AND THROAT WITH OR WITHOUT PRESENCE OF ULCERS (sore throat, sores in mouth, or a toothache) °UNUSUAL RASH, SWELLING OR PAIN  °UNUSUAL VAGINAL DISCHARGE OR ITCHING  ° °Items with * indicate a potential emergency and should be followed up as soon as possible or go to the Emergency Department if any problems should occur. ° °Please show the CHEMOTHERAPY ALERT CARD or IMMUNOTHERAPY ALERT CARD at check-in  to the Emergency Department and triage nurse. ° °Should you have questions after your visit or need to cancel or reschedule your appointment, please contact [facility]ICAL [facility]COL[facility]  Dept: [phone]  and follow the prompts.  Office hours are 8:00 a.m. to 4:30 p.m. Monday - Friday. Please note that voicemails left after 4:00 p.m. may not be returned until the following business day.  We are closed weekends and major holidays. You have access to a nurse at all times for urgent questions. Please call the main number to the clinic Dept: [phone] and follow the prompts. ° ° °For any non-urgent questions, you may also contact your provider using MyChart. We now offer e-Visits for anyone 18 and older to request care online for non-urgent symptoms. For details visit [email or web address]. °  °Also download the MyChart app! Go to the app store, search "MyChart", open the app, select [facility], and log in with your MyChart username and password. ° °Due to Covid, a mask is required upon entering the hospital/clinic. If you do not have a mask, one will be given to you upon arrival. For doctor visits, patients may have 1 support person aged 18 or older with them. For treatment visits, patients cannot have anyone with them due to current Covid guidelines and our immunocompromised population.  ° °

## 2021-04-08 ENCOUNTER — Other Ambulatory Visit: Payer: Self-pay | Admitting: Internal Medicine

## 2021-04-08 DIAGNOSIS — C719 Malignant neoplasm of brain, unspecified: Secondary | ICD-10-CM

## 2021-04-09 ENCOUNTER — Encounter: Payer: Self-pay | Admitting: Internal Medicine

## 2021-04-12 ENCOUNTER — Ambulatory Visit (HOSPITAL_COMMUNITY)
Admission: RE | Admit: 2021-04-12 | Discharge: 2021-04-12 | Disposition: A | Discharge status: Home / Self Care | Payer: 59 | Source: Ambulatory Visit | Attending: Internal Medicine | Admitting: Internal Medicine

## 2021-04-12 ENCOUNTER — Other Ambulatory Visit: Payer: Self-pay

## 2021-04-12 DIAGNOSIS — C719 Malignant neoplasm of brain, unspecified: Secondary | ICD-10-CM | POA: Diagnosis present

## 2021-04-12 IMAGING — MR MR HEAD WO/W CM
17 series · 48 of 48 positions shown · IV contrast (9 GADAVIST)
Comparison: [DATE]

CLINICAL DATA: Glioblastoma follow-up

EXAM:
MRI HEAD WITHOUT AND WITH CONTRAST
TECHNIQUE: Multiplanar, multiecho pulse sequences of the brain and surrounding
structures were obtained without and with intravenous contrast.
CONTRAST:  9mL GADAVIST GADOBUTROL 1 MMOL/ML IV SOLN

[Series 5: DWI · axial · 3.0mm · 1.36mm/px · z∈[-49,+91]mm · 4 of 96 slices shown (1 of 2)]
[im 1/96]
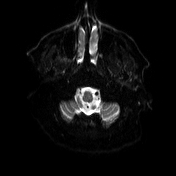
[im 32/96]
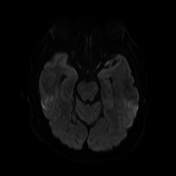
[im 64/96]
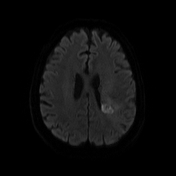
[im 96/96]
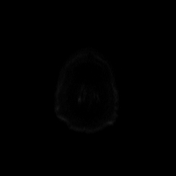

[Series 6: DWI · axial · 3.0mm · 1.36mm/px · z∈[-49,+91]mm · 3 of 48 slices shown (2 of 2)]
[im 1/48]
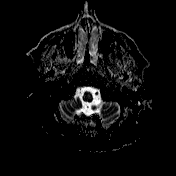
[im 24/48]
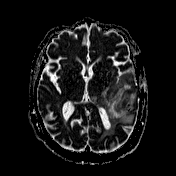
[im 48/48]
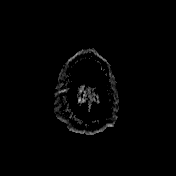

[Series 7: T1 · sagittal · 5.0mm · 0.75mm/px · 1 of 24 slices shown (1 of 4)]
[im 1/24]
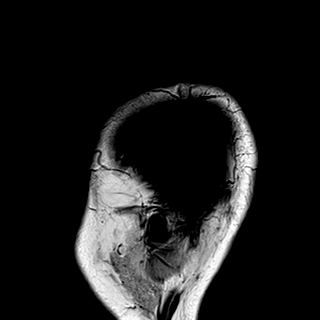

[Series 8: T2 · axial · 5.0mm · 0.62mm/px · 1 of 26 slices shown]
[im 1/26]
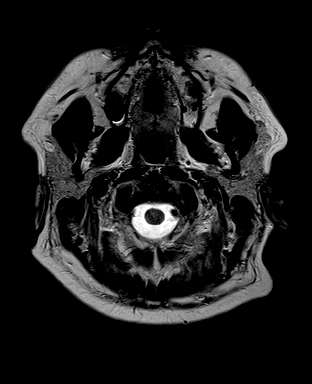

[Series 9: swi_images · axial · 3.0mm · 0.75mm/px · z∈[-66,+109]mm · 3 of 60 slices shown]
[im 1/60]
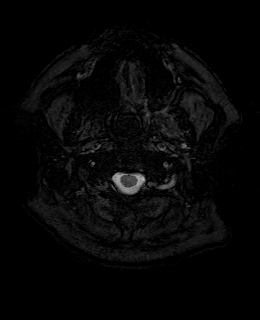
[im 30/60]
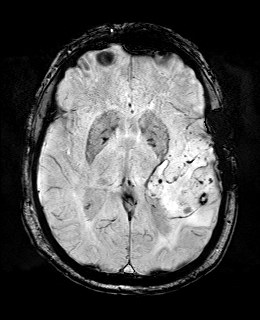
[im 60/60]
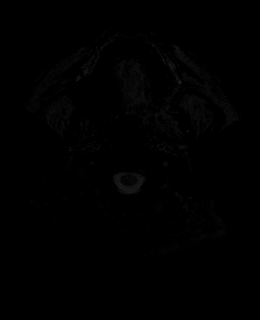

[Series 11: FLAIR · axial · 3.0mm · 0.75mm/px · z∈[-54,+97]mm · 3 of 52 slices shown]
[im 1/52]
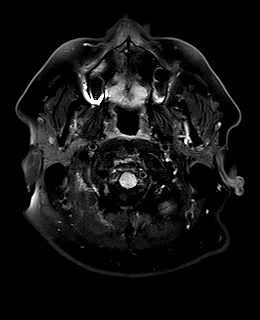
[im 26/52]
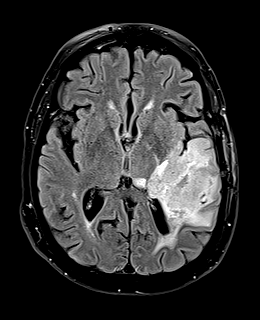
[im 52/52]
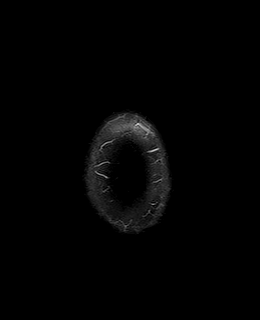

[Series 12: T1 · axial · 1.0mm · 0.94mm/px · z∈[-49,+92]mm · 8 of 144 slices shown (2 of 4)]
[im 1/144]
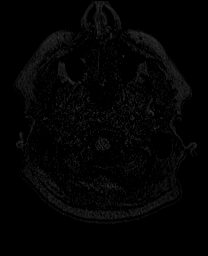
[im 21/144]
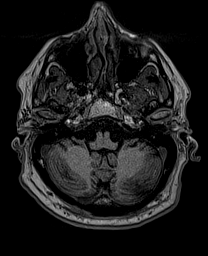
[im 41/144]
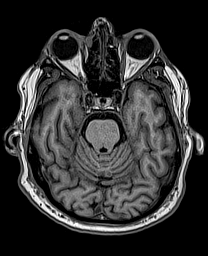
[im 62/144]
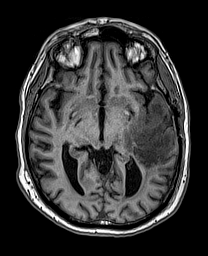
[im 82/144]
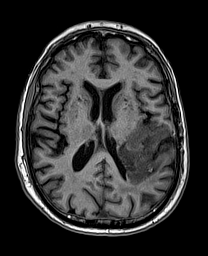
[im 103/144]
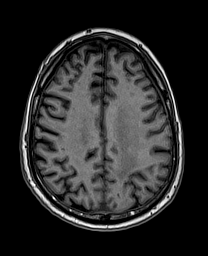
[im 123/144]
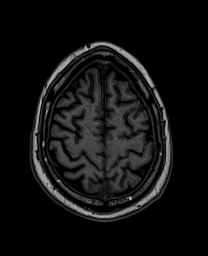
[im 144/144]
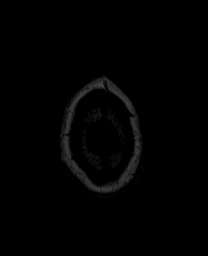

[Series 13: cor dwi_tracew · coronal · 5.0mm · 1.53mm/px · 3 of 54 slices shown]
[im 1/54]
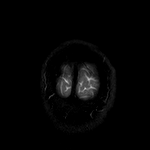
[im 27/54]
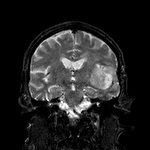
[im 54/54]
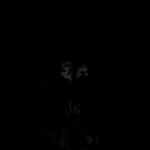

[Series 14: cor dwi_adc · coronal · 5.0mm · 1.53mm/px · 1 of 27 slices shown]
[im 1/27]
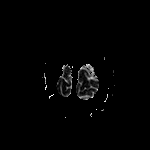

[Series 15: resolve dwi_tracew · axial · 5.0mm · 1.67mm/px · z∈[-57,+98]mm · 3 of 50 slices shown]
[im 1/50]
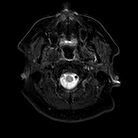
[im 25/50]
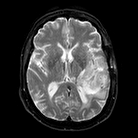
[im 50/50]
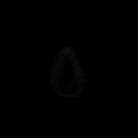

[Series 16: resolve dwi_adc · axial · 5.0mm · 1.67mm/px · 1 of 25 slices shown]
[im 1/25]
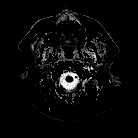

[Series 17: T2 post-contrast · coronal · 5.0mm · 0.57mm/px · 2 of 32 slices shown]
[im 1/32]
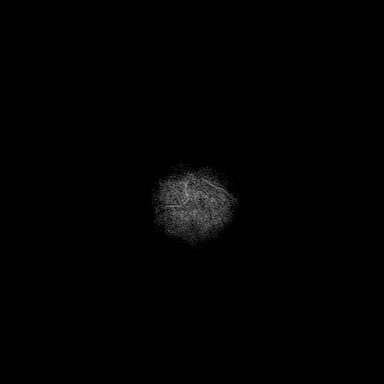
[im 32/32]
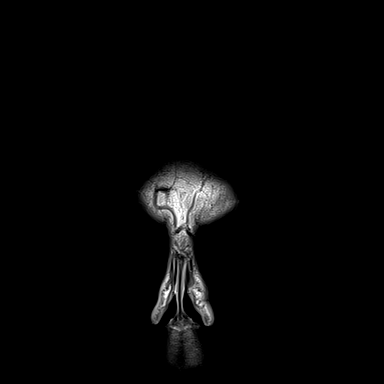

[Series 18: T1 post-contrast · axial · 1.0mm · 0.94mm/px · z∈[-49,+92]mm · 8 of 144 slices shown (1 of 3)]
[im 1/144]
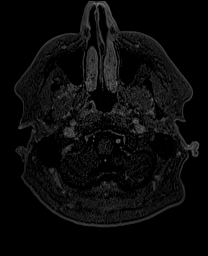
[im 21/144]
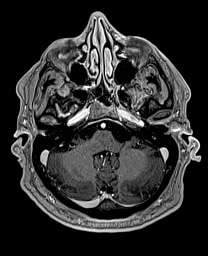
[im 41/144]
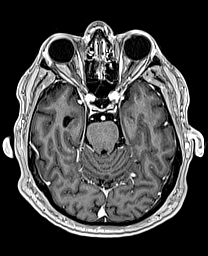
[im 62/144]
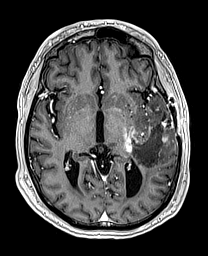
[im 82/144]
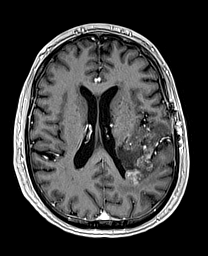
[im 103/144]
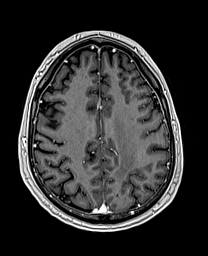
[im 123/144]
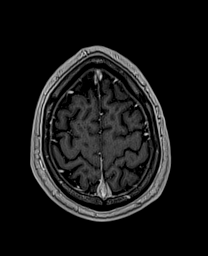
[im 144/144]
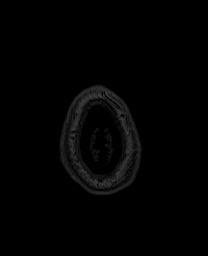

[Series 19: T1 · sagittal · 4.0mm · 0.94mm/px · 2 of 30 slices shown (3 of 4)]
[im 1/30]
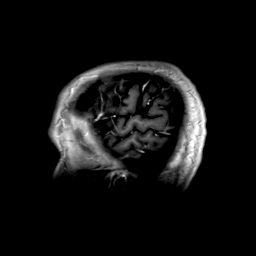
[im 30/30]
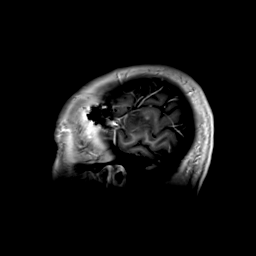

[Series 20: T1 · coronal · 4.0mm · 0.94mm/px · 2 of 30 slices shown (4 of 4)]
[im 1/30]
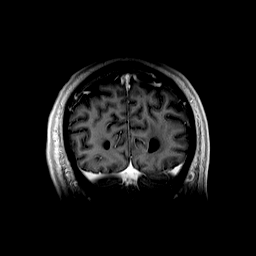
[im 30/30]
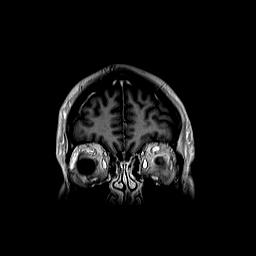

[Series 21: T1 post-contrast · coronal · 5.0mm · 0.43mm/px · 2 of 32 slices shown (2 of 3)]
[im 1/32]
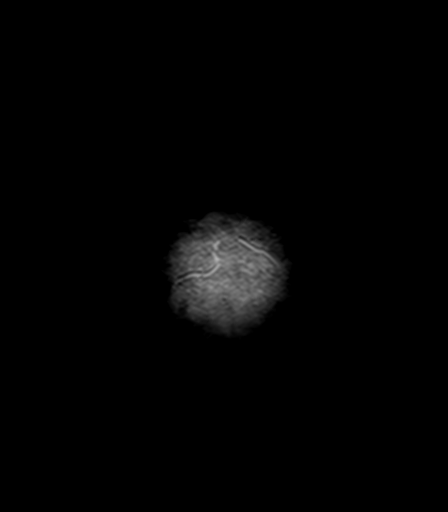
[im 32/32]
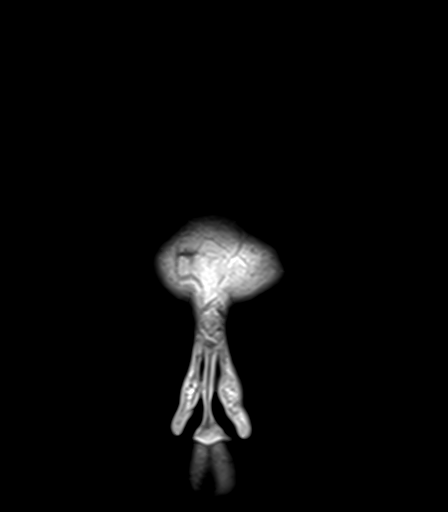

[Series 22: T1 post-contrast · sagittal · 5.0mm · 0.75mm/px · 1 of 24 slices shown (3 of 3)]
[im 1/24]
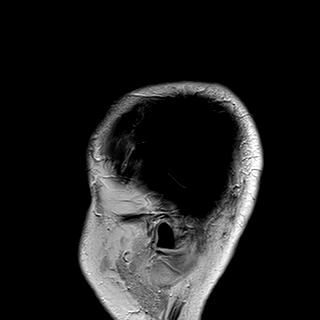

[48 of 48 positions shown; findings below may reference images not displayed]

FINDINGS: Brain: Heterogeneously enhancing and largely necrotic mass of the
left parietal and temporal lobes has increased in size. The most
medial area contrast enhancement now measures 2.1 x 1.0 cm. The more
lateral enhancing area now measures 1.5 x 2.5 cm. The overall size
of the abnormal area is slightly larger, measuring 7.1 x 4.5 cm,
previously 6.3 x 3.4 cm (series 12, image 75). Area hyperintense
T2-weighted signal is slightly worsened particularly in the left
temporal lobe. Multifocal chronic hemorrhage at the site of the mass
is unchanged. There is no remote area of abnormal contrast
enhancement. The midline structures are normal.

Vascular: Major flow voids are preserved.

Skull and upper cervical spine: Remote left craniotomy.

Sinuses/Orbits:No paranasal sinus fluid levels or advanced mucosal
thickening. No mastoid or middle ear effusion. Normal orbits.
IMPRESSION: Slight increase in size of left parietal and temporal tumor with
slight worsening of hyperintense T2-weighted signal in the left
temporal lobe.

## 2021-04-12 MED ORDER — GADOBUTROL 1 MMOL/ML IV SOLN
9.0000 mL | Freq: Once | INTRAVENOUS | Status: AC | PRN
  Administered 2021-04-12: 9 mL via INTRAVENOUS

## 2021-04-16 ENCOUNTER — Other Ambulatory Visit: Payer: Self-pay | Admitting: Nurse Practitioner

## 2021-04-16 ENCOUNTER — Other Ambulatory Visit: Payer: Self-pay | Admitting: Internal Medicine

## 2021-04-16 ENCOUNTER — Other Ambulatory Visit: Payer: Self-pay

## 2021-04-16 ENCOUNTER — Encounter: Payer: Self-pay | Admitting: Nurse Practitioner

## 2021-04-16 DIAGNOSIS — C719 Malignant neoplasm of brain, unspecified: Secondary | ICD-10-CM

## 2021-04-16 DIAGNOSIS — Z515 Encounter for palliative care: Secondary | ICD-10-CM

## 2021-04-16 NOTE — Progress Notes (Signed)
Reviewed Teressa Lower recent MRI brain demonstrating progression of disease.    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    This was reviewed over the phone with the patient and his wife, they are agreeable with this plan.  Further discussion will take place in person on 04/18/21 prior to first infusion.  [name], MD

## 2021-04-16 NOTE — Progress Notes (Signed)
[facility] Consult Note Telephone: [phone]  Fax: [phone]    Date of encounter: 04/16/21 PATIENT NAME: Christopher Tucker [address] [place] [address]   [phone] (home)  DOB: 04-Jun-1961 MRN: [id number] PRIMARY CARE PROVIDER:    Alroy Dust, L.Marlou Sa, MD,  [place] [address] [place] [address] [phone]  RESPONSIBLE PARTY:    Contact Information     Name Relation Home Work [place] Spouse [phone]        Due to the COVID-19 crisis, this visit was done via telemedicine from my office and it was initiated and consent by this patient and or family.  I connected with  Christopher Tucker on 04/16/21 by a video enabled telemedicine application and verified that I am speaking with the correct person using two identifiers.   I discussed the limitations of evaluation and management by telemedicine. The patient expressed understanding and agreed to proceed. Palliative Care was asked to follow this patient by consultation request of  Mitchell, L.Marlou Sa, MD to address advance care planning and complex medical decision making. This is a follow up visit.                           ASSESSMENT AND PLAN / RECOMMENDATIONS:   Advance Care Planning/Goals of Care: Goals include to maximize quality of life and symptom management. Our advance care planning conversation included a discussion about:    The value and importance of advance care planning  Experiences with loved ones who have been seriously ill or have died  Exploration of personal, cultural or spiritual beliefs that might influence medical decisions  Exploration of goals of care in the event of a sudden injury or illness  Identification and preparation of a healthcare agent  Review and updating or creation of an  advance directive document . Decision not to resuscitate or to de-escalate disease focused treatments due to poor prognosis. CODE STATUS: Full  code  Symptom Management/Plan:   1. Advance Care Planning; Full code;  Christopher Tucker and I talked at length about MOST form, code status, full code vs DNR. Christopher Tucker endorses she will further discus with Christopher Tucker and return call if wishes to make a DNR. Will mail blank MOST form with [employer] book.   2. Glioblastoma continue current treatments, Oncology following, monitoring worsening symptoms with focus managing symptoms chronic shoulder pain with Orthopedic referral, vision loss with Opthalmology referral pending   3. Goals of Care: Goals include to maximize quality of life and symptom management. Our advance care planning conversation included a discussion about:    The value and importance of advance care planning  Exploration of personal, cultural or spiritual beliefs that might influence medical decisions  Exploration of goals of care in the event of a sudden injury or illness  Identification and preparation of a healthcare agent  Review and updating or creation of an advance directive document.   4. Palliative care encounter; Palliative care encounter; Palliative medicine team will continue to support patient, patient's family, and medical team. Visit consisted of counseling and education dealing with the complex and emotionally intense issues of symptom management and palliative care in the setting of serious and potentially life-threatening illness   5. f/u 2 month for ongoing monitoring chronic disease progression, ongoing discussions complex medical decision making  Follow up Palliative Care Visit: Palliative care will continue to follow for complex medical decision making,  advance care planning, and clarification of goals. Return 8 weeks or prn.  I spent 35 minutes providing this consultation. More than 50% of the time in this consultation was spent in counseling and care coordination. PPS: 50%  Chief Complaint: Follow up Palliative consult visit for complex medical decision  making  HISTORY OF PRESENT ILLNESS:  Christopher Tucker is a 60 y.o. year old male  with multiple medical problems including Glioblastoma, IDH-wildtype, craniotomy, hyperlipidemia, history of sciatica. I called Christopher Tucker for f/u PC visit telemedicine/telephonic as video not available. Christopher Tucker and I talked about how Christopher Tucker has been doing. Christopher Tucker updated on last Oncology visit with Christopher Tucker with recent imaging showing Glioblastoma continues to increase despite infusions, noted cataract. Symptoms of vision loss peripheral vision, right field hemianopia with pending Opthalmology appointment. We talked about symptoms of shoulder pain which continues and pending Orthopedic appointment. Challenges discussed with pain management with drug interactions with infusions. We talked about behaviors which have improved. We talked about increase in difficulty with memory, language c/w transcortical expressive dysphasia. We talked about gait changing with dystaxic, hemiparetic. We talked about continued on depakoke for seizures with no seizure activity, likely helped behaviors as well. Christopher Tucker talked about Christopher Tucker continues to sleep a lot. We talked about medical goals of care. We talked about aggressive vs comfort care. We talked about realistic expectations. We talked about Christopher Tucker calling [employer] bereavement counselor. Christopher Tucker endorses Christopher Tucker called on his own, but then was not able to remember what was discussed. We talked about quality of life vs quantity of days. We talked about realistic expectations with things to look for as Christopher Tucker declines. We talked about hospice services under Medicare benefit. We talked about what services are provided. We talked about as long as Christopher Tucker is receiving Avastin infusion and oral metronomic Temozolomide would not be eligible at this time. We talked about discussion with Oncologist Christopher Tucker last night about most recent imaging. Mrs. Pretlow and I talked  at length about MOST form, code status, full code vs DNR. Christopher Tucker endorses she will further discus with Christopher Tucker and return call if wishes to make a DNR. Will mail blank MOST form with [employer] Choice book. Talked about caregiver stress, coping strategies. Mrs. Gudino endorses Mr. Sidor continues to be detached from her overall including interactions with improved behaviors. Discussed role PC in poc. Mrs. Loose in agreement to schedule another telemedicine telephonic visit for f/u pc. Scheduled per request. Therapeutic listening, emotional support provided. Questions answered.   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 Lisbeth Renshaw    07/30/2020 - Chemotherapy     Patient is on Treatment Plan: BRAIN ANAPLASTIC GLIOMA GRADE III TEMOZOLOMIDE POST XRT Q28D    Patient is on Antibody Plan: BRAIN GBM BEVACIZUMAB 14D X 6 CYCLES      10/18/2020 - Chemotherapy     Patient is on Treatment Plan: BRAIN ANAPLASTIC GLIOMA GRADE III TEMOZOLOMIDE POST XRT Q28D    Patient is on Antibody Plan: BRAIN GBM BEVACIZUMAB 14D X 6 CYCLES        History obtained from review of EMR, discussion with Mrs. Pagliarulo/Mr. Smarr.  I reviewed available labs, medications, imaging, studies and related documents from the EMR.  Records reviewed and summarized above.   ROS +Pain  shoulder; +fatigue, +vision changes  Physical Exam: deferred  Questions and concerns were addressed. The wife was encouraged to call with questions and/or concerns. My contact was provided. Provided general support and encouragement, no other unmet needs identified   Thank you for the opportunity to participate in the care of Mr. Breese.  The palliative care team will continue to follow. Please call our office at [phone] if we can be of additional assistance.   This chart was  dictated using voice recognition software.  Despite best efforts to proofread,  errors can occur which can change the documentation meaning.   Kinsly Hild Ihor Gully, NP

## 2021-04-18 ENCOUNTER — Other Ambulatory Visit: Payer: Self-pay

## 2021-04-18 ENCOUNTER — Inpatient Hospital Stay: Payer: 59

## 2021-04-18 ENCOUNTER — Inpatient Hospital Stay (HOSPITAL_BASED_OUTPATIENT_CLINIC_OR_DEPARTMENT_OTHER): Payer: 59 | Admitting: Internal Medicine

## 2021-04-18 VITALS — BP 143/92 | HR 58 | Temp 98.3°F | Resp 17 | Ht 71.0 in | Wt 196.1 lb

## 2021-04-18 VITALS — BP 139/95

## 2021-04-18 DIAGNOSIS — C719 Malignant neoplasm of brain, unspecified: Secondary | ICD-10-CM

## 2021-04-18 DIAGNOSIS — Z5112 Encounter for antineoplastic immunotherapy: Secondary | ICD-10-CM | POA: Diagnosis not present

## 2021-04-18 LAB — CMP (CANCER CENTER ONLY)
ALT: 26 U/L (ref 0–44)
AST: 24 U/L (ref 15–41)
Albumin: 3.8 g/dL (ref 3.5–5.0)
Alkaline Phosphatase: 46 U/L (ref 38–126)
Anion gap: 9 (ref 5–15)
BUN: 21 mg/dL — ABNORMAL HIGH (ref 6–20)
CO2: 22 mmol/L (ref 22–32)
Calcium: 9.6 mg/dL (ref 8.9–10.3)
Chloride: 108 mmol/L (ref 98–111)
Creatinine: 0.86 mg/dL (ref 0.61–1.24)
GFR, Estimated: >60 mL/min (ref >60)
Glucose, Bld: 91 mg/dL (ref 70–99)
Potassium: 5.1 mmol/L (ref 3.5–5.1)
Sodium: 139 mmol/L (ref 135–145)
Total Bilirubin: 0.8 mg/dL (ref 0.3–1.2)
Total Protein: 7.4 g/dL (ref 6.5–8.1)

## 2021-04-18 LAB — CBC WITH DIFFERENTIAL (CANCER CENTER ONLY)
Abs Immature Granulocytes: 0.02 10*3/uL (ref 0.00–0.07)
Basophils Absolute: 0.1 10*3/uL (ref 0.0–0.1)
Basophils Relative: 1 %
Eosinophils Absolute: 0.1 10*3/uL (ref 0.0–0.5)
Eosinophils Relative: 3 %
HCT: 46.9 % (ref 39.0–52.0)
Hemoglobin: 15.7 g/dL (ref 13.0–17.0)
Immature Granulocytes: 0 %
Lymphocytes Relative: 27 %
Lymphs Abs: 1.2 10*3/uL (ref 0.7–4.0)
MCH: 29.5 pg (ref 26.0–34.0)
MCHC: 33.5 g/dL (ref 30.0–36.0)
MCV: 88 fL (ref 80.0–100.0)
Monocytes Absolute: 0.7 10*3/uL (ref 0.1–1.0)
Monocytes Relative: 16 %
Neutro Abs: 2.3 10*3/uL (ref 1.7–7.7)
Neutrophils Relative %: 53 %
Platelet Count: 154 10*3/uL (ref 150–400)
RBC: 5.33 MIL/uL (ref 4.22–5.81)
RDW: 13.5 % (ref 11.5–15.5)
WBC Count: 4.5 10*3/uL (ref 4.0–10.5)
nRBC: 0 % (ref 0.0–0.2)

## 2021-04-18 LAB — TOTAL PROTEIN, URINE DIPSTICK: Protein, ur: NEGATIVE mg/dL

## 2021-04-18 MED ORDER — HEPARIN SOD (PORK) LOCK FLUSH 100 UNIT/ML IV SOLN
500.0000 [IU] | Freq: Once | INTRAVENOUS | Status: DC | PRN
  Filled 2021-04-18: qty 5

## 2021-04-18 MED ORDER — PALONOSETRON HCL INJECTION 0.25 MG/5ML
0.2500 mg | Freq: Once | INTRAVENOUS | Status: AC
  Administered 2021-04-18: 0.25 mg via INTRAVENOUS

## 2021-04-18 MED ORDER — PALONOSETRON HCL INJECTION 0.25 MG/5ML
INTRAVENOUS | Status: AC
  Filled 2021-04-18: qty 5

## 2021-04-18 MED ORDER — SODIUM CHLORIDE 0.9 % IV SOLN
125.0000 mg/m2 | Freq: Once | INTRAVENOUS | Status: AC
  Administered 2021-04-18: 260 mg via INTRAVENOUS
  Filled 2021-04-18: qty 13

## 2021-04-18 MED ORDER — ATROPINE SULFATE 1 MG/ML IJ SOLN
INTRAMUSCULAR | Status: AC
  Filled 2021-04-18: qty 1

## 2021-04-18 MED ORDER — SODIUM CHLORIDE 0.9 % IV SOLN
10.0000 mg | Freq: Once | INTRAVENOUS | Status: AC
  Administered 2021-04-18: 10 mg via INTRAVENOUS
  Filled 2021-04-18: qty 10

## 2021-04-18 MED ORDER — LOPERAMIDE HCL 2 MG PO TABS: 2.0000 mg | ORAL_TABLET | Freq: Four times a day (QID) | ORAL | 1 refills | Status: DC | PRN

## 2021-04-18 MED ORDER — SODIUM CHLORIDE 0.9% FLUSH
10.0000 mL | INTRAVENOUS | Status: DC | PRN
  Filled 2021-04-18: qty 10

## 2021-04-18 MED ORDER — SODIUM CHLORIDE 0.9 % IV SOLN
Freq: Once | INTRAVENOUS | Status: AC
  Filled 2021-04-18: qty 250

## 2021-04-18 MED ORDER — SODIUM CHLORIDE 0.9 % IV SOLN
10.0000 mg/kg | Freq: Once | INTRAVENOUS | Status: AC
  Administered 2021-04-18: 900 mg via INTRAVENOUS
  Filled 2021-04-18: qty 32

## 2021-04-18 MED ORDER — PROCHLORPERAZINE MALEATE 10 MG PO TABS: 10.0000 mg | ORAL_TABLET | Freq: Four times a day (QID) | ORAL | 0 refills | Status: AC | PRN

## 2021-04-18 NOTE — Progress Notes (Signed)
Okay to proceed with BP today per Dr. [name]

## 2021-04-18 NOTE — Patient Instructions (Signed)
[facility]   Discharge Instructions: Thank you for choosing [facility] to provide your oncology and hematology care.   If you have a lab appointment with the [facility], please go directly to the [facility] and check in at the registration area.   Wear comfortable clothing and clothing appropriate for easy access to any Portacath or PICC line.   We strive to give you quality time with your provider. You may need to reschedule your appointment if you arrive late (15 or more minutes).  Arriving late affects you and other patients whose appointments are after yours.  Also, if you miss three or more appointments without notifying the office, you may be dismissed from the clinic at the provider's discretion.      For prescription refill requests, have your pharmacy contact our office and allow 72 hours for refills to be completed.    Today you received the following chemotherapy and/or immunotherapy agents: bevacizumab and irinotecan.   To help prevent nausea and vomiting after your treatment, we encourage you to take your nausea medication as directed.  BELOW ARE SYMPTOMS THAT SHOULD BE REPORTED IMMEDIATELY: *FEVER GREATER THAN 100.4 F (38 C) OR HIGHER *CHILLS OR SWEATING *NAUSEA AND VOMITING THAT IS NOT CONTROLLED WITH YOUR NAUSEA MEDICATION *UNUSUAL SHORTNESS OF BREATH *UNUSUAL BRUISING OR BLEEDING *URINARY PROBLEMS (pain or burning when urinating, or frequent urination) *BOWEL PROBLEMS (unusual diarrhea, constipation, pain near the anus) TENDERNESS IN MOUTH AND THROAT WITH OR WITHOUT PRESENCE OF ULCERS (sore throat, sores in mouth, or a toothache) UNUSUAL RASH, SWELLING OR PAIN  UNUSUAL VAGINAL DISCHARGE OR ITCHING   Items with * indicate a potential emergency and should be followed up as soon as possible or go to the Emergency Department if any problems should occur.  Please show the CHEMOTHERAPY ALERT CARD or IMMUNOTHERAPY ALERT  CARD at check-in to the Emergency Department and triage nurse.  Should you have questions after your visit or need to cancel or reschedule your appointment, please contact [facility]  Dept: [phone]  and follow the prompts.  Office hours are 8:00 a.m. to 4:30 p.m. Monday - Friday. Please note that voicemails left after 4:00 p.m. may not be returned until the following business day.  We are closed weekends and major holidays. You have access to a nurse at all times for urgent questions. Please call the main number to the clinic Dept: [phone] and follow the prompts.   For any non-urgent questions, you may also contact your provider using MyChart. We now offer e-Visits for anyone 43 and older to request care online for non-urgent symptoms. For details visit [email or web address].   Also download the MyChart app! Go to the app store, search "MyChart", open the app, select [facility], and log in with your MyChart username and password.  Due to Covid, a mask is required upon entering the hospital/clinic. If you do not have a mask, one will be given to you upon arrival. For doctor visits, patients may have 1 support person aged 32 or older with them. For treatment visits, patients cannot have anyone with them due to current Covid guidelines and our immunocompromised population.   Irinotecan injection What is this medication? IRINOTECAN (ir in oh TEE kan ) is a chemotherapy drug. It is used to treatcolon and rectal cancer. This medicine may be used for other purposes; ask your health care provider orpharmacist if you have questions. COMMON BRAND NAME(S): Camptosar What should  I tell my care team before I take this medication? They need to know if you have any of these conditions: dehydration diarrhea infection (especially a virus infection such as chickenpox, cold sores, or herpes) liver disease low blood counts, like low white cell, platelet, or red cell  counts low levels of calcium, magnesium, or potassium in the blood recent or ongoing radiation therapy an unusual or allergic reaction to irinotecan, other medicines, foods, dyes, or preservatives pregnant or trying to get pregnant breast-feeding How should I use this medication? This drug is given as an infusion into a vein. It is administered in a hospitalor clinic by a specially trained health care professional. Talk to your pediatrician regarding the use of this medicine in children.Special care may be needed. Overdosage: If you think you have taken too much of this medicine contact apoison control center or emergency room at once. NOTE: This medicine is only for you. Do not share this medicine with others. What if I miss a dose? It is important not to miss your dose. Call your doctor or health careprofessional if you are unable to keep an appointment. What may interact with this medication? Do not take this medicine with any of the following medications: cobicistat itraconazole This medicine may interact with the following medications: antiviral medicines for HIV or AIDS certain antibiotics like rifampin or rifabutin certain medicines for fungal infections like ketoconazole, posaconazole, and voriconazole certain medicines for seizures like carbamazepine, phenobarbital, phenotoin clarithromycin gemfibrozil nefazodone [facility]. John's Wort This list may not describe all possible interactions. Give your health care provider a list of all the medicines, herbs, non-prescription drugs, or dietary supplements you use. Also tell them if you smoke, drink alcohol, or use illegaldrugs. Some items may interact with your medicine. What should I watch for while using this medication? Your condition will be monitored carefully while you are receiving this medicine. You will need important blood work done while you are taking thismedicine. This drug may make you feel generally unwell. This is not  uncommon, as chemotherapy can affect healthy cells as well as cancer cells. Report any side effects. Continue your course of treatment even though you feel ill unless yourdoctor tells you to stop. In some cases, you may be given additional medicines to help with side effects.Follow all directions for their use. You may get drowsy or dizzy. Do not drive, use machinery, or do anything that needs mental alertness until you know how this medicine affects you. Do not stand or sit up quickly, especially if you are an older patient. This reducesthe risk of dizzy or fainting spells. Call your health care professional for advice if you get a fever, chills, or sore throat, or other symptoms of a cold or flu. Do not treat yourself. This medicine decreases your body's ability to fight infections. Try to avoid beingaround people who are sick. Avoid taking products that contain aspirin, acetaminophen, ibuprofen, naproxen, or ketoprofen unless instructed by your doctor. These medicines may hide afever. This medicine may increase your risk to bruise or bleed. Call your doctor orhealth care professional if you notice any unusual bleeding. Be careful brushing and flossing your teeth or using a toothpick because you may get an infection or bleed more easily. If you have any dental work done,tell your dentist you are receiving this medicine. Do not become pregnant while taking this medicine or for 6 months after stopping it. Women should inform their health care professional if they wish to become pregnant or think they  might be pregnant. Men should not father a child while taking this medicine and for 3 months after stopping it. There is potential for serious side effects to an unborn child. Talk to your health careprofessional for more information. Do not breast-feed an infant while taking this medicine or for 7 days afterstopping it. This medicine has caused ovarian failure in some women. This medicine may make it more  difficult to get pregnant. Talk to your health care professional if [name] concerned about your fertility. This medicine has caused decreased sperm counts in some men. This may make it more difficult to father a child. Talk to your health care professional if [name] concerned about your fertility. What side effects may I notice from receiving this medication? Side effects that you should report to your doctor or health care professionalas soon as possible: allergic reactions like skin rash, itching or hives, swelling of the face, lips, or tongue chest pain diarrhea flushing, runny nose, sweating during infusion low blood counts - this medicine may decrease the number of white blood cells, red blood cells and platelets. You may be at increased risk for infections and bleeding. nausea, vomiting pain, swelling, warmth in the leg signs of decreased platelets or bleeding - bruising, pinpoint red spots on the skin, black, tarry stools, blood in the urine signs of infection - fever or chills, cough, sore throat, pain or difficulty passing urine signs of decreased red blood cells - unusually weak or tired, fainting spells, lightheadedness Side effects that usually do not require medical attention (report to yourdoctor or health care professional if they continue or are bothersome): constipation hair loss headache loss of appetite mouth sores stomach pain This list may not describe all possible side effects. Call your doctor for medical advice about side effects. You may report side effects to FDA at1-800-FDA-1088. Where should I keep my medication? This drug is given in a hospital or clinic and will not be stored at home. NOTE: This sheet is a summary. It may not cover all possible information. If you have questions about this medicine, talk to your doctor, pharmacist, orhealth care provider.  2022 Elsevier/Gold Standard (2019-08-09 17:46:13)

## 2021-04-18 NOTE — Progress Notes (Signed)
[facility] at [facility], [place] [address] [phone]   Interval Evaluation  Date of Service: 04/18/21 Patient Name: Christopher Tucker Patient MRN: [id number] Patient DOB: 1961/09/09 Provider: [name], MD  Identifying Statement:  Christopher Tucker is a 60 y.o. male with left temporal anaplastic astrocytoma   Oncologic History: Oncology History  Glioblastoma, IDH-wildtype ([place])  05/14/2020 Initial Diagnosis   Anaplastic astrocytoma, IDH-wildtype ([place])    05/21/2020 - 07/02/2020 Radiation Therapy   IMRT and concurrent Temodar with Dr. Lisbeth Renshaw   07/30/2020 - 08/07/2020 Chemotherapy    Patient is on Treatment Plan: BRAIN GBM DUKE IRINOTECAN + BEVACIZUMAB D1,15 Q 28D   Patient is on Antibody Plan: BRAIN GBM BEVACIZUMAB 14D X 6 CYCLES     08/16/2020 -  Chemotherapy    Patient is on Treatment Plan: BRAIN GBM DUKE IRINOTECAN + BEVACIZUMAB D1,15 Q 28D   Patient is on Antibody Plan: BRAIN GBM BEVACIZUMAB 14D X 6 CYCLES     10/18/2020 -  Chemotherapy    Patient is on Treatment Plan: BRAIN ANAPLASTIC GLIOMA GRADE III TEMOZOLOMIDE POST XRT Q28D   Patient is on Antibody Plan: BRAIN GBM BEVACIZUMAB 14D X 6 CYCLES        Biomarkers:  MGMT Unknown.  IDH 1/2 Wild type.  EGFR Unknown  TERT "Mutated   Interval History:  Christopher Tucker presents today following recent MRI brain.  He describes modest increase in lethargy, word finding difficulty. Continues to have aching pain in both shoulders.  Gait remains independent.  Denies seizures or headaches.  Depakote still at 559m twice per day, no further seizures.    Decadron: 10/05/20: 855m1/28/22: 3m68m/24/22: 1mg68m04/22: -  H+P (05/10/20) Patient presented to medical attention in early August with several months history of sensory episodes.  They are described as "numbness marching down right arm also involving the leg, lasting for several minutes".  There is no post event weakness or  confusion.  No known history of seizures.  In recent weeks he has also experienced some difficulty with expressive language, finding particular words and maintaining high fluency.  Since starting Keppra post-operatively he has not had any recurrence of these sensory events.  He has weaned off dexamethasone.  We are still awaiting finalized pathology results.    Medications: Current Outpatient Medications on File Prior to Visit  Medication Sig Dispense Refill   amLODipine (NORVASC) 2.5 MG tablet Take 2.5 mg by mouth daily.     divalproex (DEPAKOTE) 500 MG DR tablet Take 1 tablet (500 mg total) by mouth 2 (two) times daily. 120 tablet 3   metoprolol tartrate (LOPRESSOR) 50 MG tablet Take 1 tablet (50 mg total) by mouth 2 (two) times daily. 60 tablet 3   sertraline (ZOLOFT) 25 MG tablet TAKE 1 TABLET (25 MG TOTAL) BY MOUTH DAILY. 30 tablet 3   dronabinol (MARINOL) 2.5 MG capsule Take 1 capsule (2.5 mg total) by mouth 2 (two) times daily before a meal. (Patient not taking: No sig reported) 60 capsule 2   gabapentin (NEURONTIN) 300 MG capsule Take 1 capsule (300 mg total) by mouth 2 (two) times daily. (Patient not taking: No sig reported) 60 capsule 3   hydrocortisone (CORTEF) 10 MG tablet TAKE 1 TABLET BY MOUTH TWICE A DAY (Patient not taking: No sig reported) 60 tablet 1   ibuprofen (ADVIL) 200 MG tablet Take 400 mg by mouth 2 (two) times daily. (Patient not taking: No sig reported)  lisinopril (ZESTRIL) 20 MG tablet Take 2 tablets (40 mg total) by mouth daily. (Patient not taking: No sig reported) 120 tablet 3   methylphenidate (RITALIN) 5 MG tablet Take 1 tablet (5 mg total) by mouth 2 (two) times daily. (Patient not taking: No sig reported) 60 tablet 0   simvastatin (ZOCOR) 40 MG tablet Take 40 mg by mouth at bedtime. (Patient not taking: No sig reported)     No current facility-administered medications on file prior to visit.    Allergies:  Allergies  Allergen Reactions   Pravastatin      Other reaction(s): muscle aches   Past Medical History:  Past Medical History:  Diagnosis Date   High cholesterol    History of kidney stones    Past Surgical History:  Past Surgical History:  Procedure Laterality Date   APPLICATION OF CRANIAL NAVIGATION Left 04/25/2020   Procedure: APPLICATION OF CRANIAL NAVIGATION;  Surgeon: Consuella Lose, MD;  Location: [facility];  Service: Neurosurgery;  Laterality: Left;   CRANIOTOMY Left 04/25/2020   Procedure: STEREOTACTIC LEFT TEMPORAL CRANIOTOMY FOR TUMOR;  Surgeon: Consuella Lose, MD;  Location: [facility];  Service: Neurosurgery;  Laterality: Left;   HERNIA REPAIR Left    groin    Social History:  Social History   Socioeconomic History   Marital status: Married    Spouse name: Not on file   Number of children: Not on file   Years of education: Not on file   Highest education level: Not on file  Occupational History   Not on file  Tobacco Use   Smoking status: Never   Smokeless tobacco: Never  Substance and Sexual Activity   Alcohol use: Yes    Alcohol/week: 6.0 standard drinks    Types: 3 Glasses of wine, 3 Cans of beer per week   Drug use: Not Currently   Sexual activity: Not on file  Other Topics Concern   Not on file  Social History Narrative   Not on file   Social Determinants of Health   Financial Resource Strain: Not on file  Food Insecurity: Not on file  Transportation Needs: Not on file  Physical Activity: Not on file  Stress: Not on file  Social Connections: Not on file  Intimate Partner Violence: Not on file   Family History: No family history on file.  Review of Systems: Constitutional: Doesn't report fevers, chills or abnormal weight loss Eyes: Doesn't report blurriness of vision Ears, nose, mouth, throat, and face: Doesn't report sore throat Respiratory: Doesn't report cough, dyspnea or wheezes Cardiovascular: Doesn't report palpitation, chest discomfort  Gastrointestinal:  Doesn't report nausea,  constipation, diarrhea GU: Doesn't report incontinence Skin: torso rash Neurological: Per HPI Musculoskeletal: Doesn't report joint pain Behavioral/Psych: Doesn't report anxiety  Physical Exam: Vitals:   04/18/21 1230  BP: (!) 143/92  Pulse: (!) 58  Resp: 17  Temp: 98.3 F (36.8 C)  SpO2: 99%    KPS: 70. General: Comfortable, NAD Head: Normal EENT: No conjunctival injection or scleral icterus.  Lungs: Resp effort normal Cardiac: Regular rate Abdomen: Non-distended abdomen Skin: normal Extremities: normal  Neurologic Exam: Mental Status: Awake, alert, attentive to examiner. Oriented to self and environment. Language c/w transcortical expressive dyshpasia Cranial Nerves: Visual acuity is grossly normal. Right field hemianopia. Extra-ocular movements intact. No ptosis. Face is symmetric Motor: Tone and bulk are normal. 4+/5 in right arm and leg. Reflexes are symmetric, no pathologic reflexes present.  Sensory: Intact to light touch Gait: Dystaxic, hemiparetic  Labs: I  have reviewed the data as listed    Component Value Date/Time   NA 141 04/04/2021 0855   K 4.4 04/04/2021 0855   CL 109 04/04/2021 0855   CO2 22 04/04/2021 0855   GLUCOSE 113 (H) 04/04/2021 0855   BUN 17 04/04/2021 0855   CREATININE 0.87 04/04/2021 0855   CREATININE 1.07 09/17/2020 0823   CALCIUM 9.2 04/04/2021 0855   PROT 6.8 04/04/2021 0855   ALBUMIN 3.4 (L) 04/04/2021 0855   AST 22 04/04/2021 0855   AST 24 09/17/2020 0823   ALT 25 04/04/2021 0855   ALT 50 (H) 09/17/2020 0823   ALKPHOS 41 04/04/2021 0855   BILITOT 0.8 04/04/2021 0855   BILITOT 1.2 09/17/2020 0823   GFRNONAA >60 04/04/2021 0855   GFRNONAA >60 09/17/2020 0823   GFRAA >60 06/14/2020 0828   Lab Results  Component Value Date   WBC 4.5 04/18/2021   NEUTROABS 2.3 04/18/2021   HGB 15.7 04/18/2021   HCT 46.9 04/18/2021   MCV 88.0 04/18/2021   PLT 154 04/18/2021    Imaging:  [facility]ician Interpretation: I have personally  reviewed the CNS images as listed.  My interpretation, in the context of the patient's clinical presentation, is progressive disease  MR BRAIN W WO CONTRAST  Result Date: 04/12/2021 CLINICAL DATA:  Glioblastoma follow-up EXAM: MRI HEAD WITHOUT AND WITH CONTRAST TECHNIQUE: Multiplanar, multiecho pulse sequences of the brain and surrounding structures were obtained without and with intravenous contrast. CONTRAST:  60m GADAVIST GADOBUTROL 1 MMOL/ML IV SOLN COMPARISON:  02/01/2021 FINDINGS: Brain: Heterogeneously enhancing and largely necrotic mass of the left parietal and temporal lobes has increased in size. The most medial area contrast enhancement now measures 2.1 x 1.0 cm. The more lateral enhancing area now measures 1.5 x 2.5 cm. The overall size of the abnormal area is slightly larger, measuring 7.1 x 4.5 cm, previously 6.3 x 3.4 cm (series 12, image 75). Area hyperintense T2-weighted signal is slightly worsened particularly in the left temporal lobe. Multifocal chronic hemorrhage at the site of the mass is unchanged. There is no remote area of abnormal contrast enhancement. The midline structures are normal. Vascular: Major flow voids are preserved. Skull and upper cervical spine: Remote left craniotomy. Sinuses/Orbits:No paranasal sinus fluid levels or advanced mucosal thickening. No mastoid or middle ear effusion. Normal orbits. IMPRESSION: Slight increase in size of left parietal and temporal tumor with slight worsening of hyperintense T2-weighted signal in the left temporal lobe. Electronically Signed   By: KUlyses JarredM.D.   On: 04/12/2021 19:28    Assessment/Plan Glioblastoma, IDH-wildtype ([place] [C71.9]  MBlane Worthingtonis clinically and radiographically progressive today.  MRI demonstrates nodular progression in several locations peripheral to dominant disease site.  T2 signal abnormality burden has also increased, although to a lesser extent.   We recommended discontinuing metronomic  Temozolomide 50 mg/m2.    We reviewed options for further salvage chemotherapy.  His options for clinical trials are compromised by lack of definitive histology, bevacizumab exposure.  We recommended trial of Irinotecan 1232mm2 IV q2 weeks, concurrent with Avastin 1039mg IV q2 weeks.  He demonstrated dramatic cytopenias with higher dose Temodar, which gives us Kore[place] regarding further alkylating chemotherapy.  Irinotecan is less likely to contribute to cytopenias; we reviewed management for diarrhea- dose 4mg67mhen 2mg 19mhours until improvement.    He is agreeable with moving forward with Irinotecan+Avastin.       Chemotherapy should be held for the following:  ANC less than 1,000  Platelets  less than 100,000  LFT or creatinine greater than 2x ULN  If clinical concerns/contraindications develop  Avastin should be held for the following:  ANC less than 1500  Platelets less than 50,000  LFT or creatinine greater than 2x ULN  If clinical concerns/contraindications develop  Ok to continue Depakote 559m BID, no breathrough seizures.  We ask that MJervon Reamreturn to in-person clinic in 2 weeks for day 15 of cycle #1.  MRI will be ordered for following second full cycle.  All questions were answered. The patient knows to call the clinic with any problems, questions or concerns. No barriers to learning were detected.  I have spent a total of 40 minutes of face-to-face and non-face-to-face time, excluding clinical staff time, preparing to see patient, ordering tests and/or medications, counseling the patient, and documenting clinical information in the electronic or other health record    ZVentura Sellers MD Medical Director of Neuro-Oncology [facility]at [place]07/28/22 12:48 PM

## 2021-04-22 ENCOUNTER — Other Ambulatory Visit: Payer: Self-pay | Admitting: Internal Medicine

## 2021-04-23 ENCOUNTER — Encounter: Payer: Self-pay | Admitting: Internal Medicine

## 2021-04-24 ENCOUNTER — Telehealth: Payer: Self-pay | Admitting: Internal Medicine

## 2021-04-24 NOTE — Telephone Encounter (Signed)
Scheduled per 07/28 los, spoke with patient's spouse regarding upcoming appointments. Patient will be notified.

## 2021-05-02 ENCOUNTER — Inpatient Hospital Stay (HOSPITAL_BASED_OUTPATIENT_CLINIC_OR_DEPARTMENT_OTHER): Payer: 59 | Admitting: Internal Medicine

## 2021-05-02 ENCOUNTER — Other Ambulatory Visit: Payer: Self-pay | Admitting: *Deleted

## 2021-05-02 ENCOUNTER — Other Ambulatory Visit: Payer: Self-pay

## 2021-05-02 ENCOUNTER — Inpatient Hospital Stay: Payer: 59

## 2021-05-02 ENCOUNTER — Inpatient Hospital Stay: Payer: 59 | Attending: Neurosurgery

## 2021-05-02 ENCOUNTER — Other Ambulatory Visit: Payer: 59

## 2021-05-02 ENCOUNTER — Ambulatory Visit: Payer: 59 | Admitting: Internal Medicine

## 2021-05-02 VITALS — BP 150/102 | HR 52 | Temp 98.7°F | Resp 17 | Ht 71.0 in | Wt 194.7 lb

## 2021-05-02 DIAGNOSIS — I10 Essential (primary) hypertension: Secondary | ICD-10-CM | POA: Diagnosis not present

## 2021-05-02 DIAGNOSIS — Z87442 Personal history of urinary calculi: Secondary | ICD-10-CM | POA: Insufficient documentation

## 2021-05-02 DIAGNOSIS — C719 Malignant neoplasm of brain, unspecified: Secondary | ICD-10-CM | POA: Insufficient documentation

## 2021-05-02 DIAGNOSIS — Z923 Personal history of irradiation: Secondary | ICD-10-CM | POA: Diagnosis not present

## 2021-05-02 DIAGNOSIS — Z5112 Encounter for antineoplastic immunotherapy: Secondary | ICD-10-CM | POA: Diagnosis present

## 2021-05-02 DIAGNOSIS — Z7952 Long term (current) use of systemic steroids: Secondary | ICD-10-CM | POA: Diagnosis not present

## 2021-05-02 DIAGNOSIS — R569 Unspecified convulsions: Secondary | ICD-10-CM | POA: Diagnosis not present

## 2021-05-02 DIAGNOSIS — Z79899 Other long term (current) drug therapy: Secondary | ICD-10-CM | POA: Diagnosis not present

## 2021-05-02 LAB — CBC WITH DIFFERENTIAL (CANCER CENTER ONLY)
Abs Immature Granulocytes: 0.05 10*3/uL (ref 0.00–0.07)
Basophils Absolute: 0 10*3/uL (ref 0.0–0.1)
Basophils Relative: 1 %
Eosinophils Absolute: 0 10*3/uL (ref 0.0–0.5)
Eosinophils Relative: 1 %
HCT: 42.1 % (ref 39.0–52.0)
Hemoglobin: 14.5 g/dL (ref 13.0–17.0)
Immature Granulocytes: 2 %
Lymphocytes Relative: 32 %
Lymphs Abs: 0.9 10*3/uL (ref 0.7–4.0)
MCH: 30.4 pg (ref 26.0–34.0)
MCHC: 34.4 g/dL (ref 30.0–36.0)
MCV: 88.3 fL (ref 80.0–100.0)
Monocytes Absolute: 0.7 10*3/uL (ref 0.1–1.0)
Monocytes Relative: 24 %
Neutro Abs: 1.1 10*3/uL — ABNORMAL LOW (ref 1.7–7.7)
Neutrophils Relative %: 40 %
Platelet Count: 165 10*3/uL (ref 150–400)
RBC: 4.77 MIL/uL (ref 4.22–5.81)
RDW: 14.4 % (ref 11.5–15.5)
WBC Count: 2.8 10*3/uL — ABNORMAL LOW (ref 4.0–10.5)
nRBC: 0 % (ref 0.0–0.2)

## 2021-05-02 LAB — TOTAL PROTEIN, URINE DIPSTICK: Protein, ur: NEGATIVE mg/dL

## 2021-05-02 LAB — CMP (CANCER CENTER ONLY)
ALT: 19 U/L (ref 0–44)
AST: 15 U/L (ref 15–41)
Albumin: 3.6 g/dL (ref 3.5–5.0)
Alkaline Phosphatase: 46 U/L (ref 38–126)
Anion gap: 8 (ref 5–15)
BUN: 17 mg/dL (ref 6–20)
CO2: 25 mmol/L (ref 22–32)
Calcium: 9 mg/dL (ref 8.9–10.3)
Chloride: 109 mmol/L (ref 98–111)
Creatinine: 0.8 mg/dL (ref 0.61–1.24)
GFR, Estimated: >60 mL/min (ref >60)
Glucose, Bld: 80 mg/dL (ref 70–99)
Potassium: 4.5 mmol/L (ref 3.5–5.1)
Sodium: 142 mmol/L (ref 135–145)
Total Bilirubin: 0.7 mg/dL (ref 0.3–1.2)
Total Protein: 6.8 g/dL (ref 6.5–8.1)

## 2021-05-02 MED ORDER — IRINOTECAN HCL CHEMO INJECTION 100 MG/5ML
125.0000 mg/m2 | Freq: Once | INTRAVENOUS | Status: AC
  Administered 2021-05-02: 260 mg via INTRAVENOUS
  Filled 2021-05-02: qty 13

## 2021-05-02 MED ORDER — SODIUM CHLORIDE 0.9 % IV SOLN
10.0000 mg/kg | Freq: Once | INTRAVENOUS | Status: AC
  Administered 2021-05-02: 900 mg via INTRAVENOUS
  Filled 2021-05-02: qty 32

## 2021-05-02 MED ORDER — SODIUM CHLORIDE 0.9 % IV SOLN
Freq: Once | INTRAVENOUS | Status: AC
  Filled 2021-05-02: qty 250

## 2021-05-02 MED ORDER — PALONOSETRON HCL INJECTION 0.25 MG/5ML
0.2500 mg | Freq: Once | INTRAVENOUS | Status: AC
  Administered 2021-05-02: 0.25 mg via INTRAVENOUS
  Filled 2021-05-02: qty 5

## 2021-05-02 MED ORDER — ATROPINE SULFATE 1 MG/ML IJ SOLN: 1.0000 mg | Freq: Once | INTRAMUSCULAR | Status: DC | PRN

## 2021-05-02 MED ORDER — METOPROLOL TARTRATE 75 MG PO TABS: 75.0000 mg | ORAL_TABLET | Freq: Two times a day (BID) | ORAL | 2 refills | Status: DC

## 2021-05-02 MED ORDER — SODIUM CHLORIDE 0.9 % IV SOLN
10.0000 mg | Freq: Once | INTRAVENOUS | Status: AC
  Administered 2021-05-02: 10 mg via INTRAVENOUS
  Filled 2021-05-02: qty 10

## 2021-05-02 NOTE — Progress Notes (Signed)
Ok to treat today 05/02/2021 with ANC of 1.1 per Dr [name]

## 2021-05-02 NOTE — Progress Notes (Signed)
Per Dr. [name], [place] to treat with today's labs and blood pressure of 150/102

## 2021-05-02 NOTE — Progress Notes (Signed)
Christopher Tucker at [place], [place] [address] [phone]   Interval Evaluation  Date of Service: 05/02/21 Patient Name: Christopher Tucker Patient MRN: [id number] Patient DOB: Jan 07, 1961 Provider: [name], MD  Identifying Statement:  Alee Katen is a 60 y.o. male with left temporal anaplastic astrocytoma   Oncologic History: Oncology History  Glioblastoma, IDH-wildtype ([place])  05/14/2020 Initial Diagnosis   Anaplastic astrocytoma, IDH-wildtype ([place])   05/21/2020 - 07/02/2020 Radiation Therapy   IMRT and concurrent Temodar with Dr. Lisbeth Renshaw   07/30/2020 - 08/07/2020 Chemotherapy    Patient is on Treatment Plan: BRAIN GBM DUKE IRINOTECAN + BEVACIZUMAB D1,15 Q 28D   Patient is on Antibody Plan: BRAIN GBM BEVACIZUMAB 14D X 6 CYCLES     10/18/2020 -  Chemotherapy    Patient is on Treatment Plan: BRAIN ANAPLASTIC GLIOMA GRADE III TEMOZOLOMIDE POST XRT Q28D   Patient is on Antibody Plan: BRAIN GBM BEVACIZUMAB 14D X 6 CYCLES     04/18/2021 -  Chemotherapy    Patient is on Treatment Plan: BRAIN GBM DUKE IRINOTECAN + BEVACIZUMAB D1,15 Q 28D   Patient is on Antibody Plan: BRAIN GBM BEVACIZUMAB 14D X 6 CYCLES        Biomarkers:  MGMT Unknown.  IDH 1/2 Wild type.  EGFR Unknown  TERT "Mutated   Interval History:  Christopher Tucker presents today for second infusion of cycle #1 Irinotecan+Avastin.  No issues with the first infusion. He describes further declines in general confusion and word finding difficulty.  Wife remarks that his energy level is a bit lower as well compared to two weeks ago.  Gait remains independent, with assistance nearby if needed.  Denies seizures or headaches.  Depakote still at 589m twice per day, no further seizures.    Decadron: 10/05/20: 8281m1/28/22: 81m38m/24/22: 1mg58m04/22: -  H+P (05/10/20) Patient presented to medical attention in early August with several months history of sensory episodes.  They are  described as "numbness marching down right arm also involving the leg, lasting for several minutes".  There is no post event weakness or confusion.  No known history of seizures.  In recent weeks he has also experienced some difficulty with expressive language, finding particular words and maintaining high fluency.  Since starting Keppra post-operatively he has not had any recurrence of these sensory events.  He has weaned off dexamethasone.  We are still awaiting finalized pathology results.    Medications: Current Outpatient Medications on File Prior to Visit  Medication Sig Dispense Refill   amLODipine (NORVASC) 2.5 MG tablet Take 2.5 mg by mouth daily.     divalproex (DEPAKOTE) 500 MG DR tablet Take 1 tablet (500 mg total) by mouth 2 (two) times daily. 120 tablet 3   dronabinol (MARINOL) 2.5 MG capsule Take 1 capsule (2.5 mg total) by mouth 2 (two) times daily before a meal. (Patient not taking: No sig reported) 60 capsule 2   gabapentin (NEURONTIN) 300 MG capsule Take 1 capsule (300 mg total) by mouth 2 (two) times daily. (Patient not taking: No sig reported) 60 capsule 3   hydrocortisone (CORTEF) 10 MG tablet TAKE 1 TABLET BY MOUTH TWICE A DAY (Patient not taking: No sig reported) 60 tablet 1   ibuprofen (ADVIL) 200 MG tablet Take 400 mg by mouth 2 (two) times daily. (Patient not taking: No sig reported)     lisinopril (ZESTRIL) 20 MG tablet Take 2 tablets (40 mg total) by mouth daily. (Patient  not taking: No sig reported) 120 tablet 3   loperamide (IMODIUM A-D) 2 MG tablet Take 1 tablet (2 mg total) by mouth 4 (four) times daily as needed. Take 2 at diarrhea onset , then 1 every 2hr until 12hrs with no BM. May take 2 every 4hrs at night. If diarrhea recurs repeat. 100 tablet 1   methylphenidate (RITALIN) 5 MG tablet Take 1 tablet (5 mg total) by mouth 2 (two) times daily. (Patient not taking: No sig reported) 60 tablet 0   metoprolol tartrate (LOPRESSOR) 50 MG tablet Take 1 tablet (50 mg total)  by mouth 2 (two) times daily. 60 tablet 3   prochlorperazine (COMPAZINE) 10 MG tablet Take 1 tablet (10 mg total) by mouth every 6 (six) hours as needed for nausea or vomiting. 30 tablet 0   sertraline (ZOLOFT) 25 MG tablet TAKE 1 TABLET (25 MG TOTAL) BY MOUTH DAILY. 30 tablet 3   simvastatin (ZOCOR) 40 MG tablet Take 40 mg by mouth at bedtime. (Patient not taking: No sig reported)     No current facility-administered medications on file prior to visit.    Allergies:  Allergies  Allergen Reactions   Pravastatin     Other reaction(s): muscle aches   Past Medical History:  Past Medical History:  Diagnosis Date   High cholesterol    History of kidney stones    Past Surgical History:  Past Surgical History:  Procedure Laterality Date   APPLICATION OF CRANIAL NAVIGATION Left 04/25/2020   Procedure: APPLICATION OF CRANIAL NAVIGATION;  Surgeon: Consuella Lose, MD;  Location: [facility];  Service: Neurosurgery;  Laterality: Left;   CRANIOTOMY Left 04/25/2020   Procedure: STEREOTACTIC LEFT TEMPORAL CRANIOTOMY FOR TUMOR;  Surgeon: Consuella Lose, MD;  Location: [facility];  Service: Neurosurgery;  Laterality: Left;   HERNIA REPAIR Left    groin    Social History:  Social History   Socioeconomic History   Marital status: Married    Spouse name: Not on file   Number of children: Not on file   Years of education: Not on file   Highest education level: Not on file  Occupational History   Not on file  Tobacco Use   Smoking status: Never   Smokeless tobacco: Never  Substance and Sexual Activity   Alcohol use: Yes    Alcohol/week: 6.0 standard drinks    Types: 3 Glasses of wine, 3 Cans of beer per week   Drug use: Not Currently   Sexual activity: Not on file  Other Topics Concern   Not on file  Social History Narrative   Not on file   Social Determinants of Health   Financial Resource Strain: Not on file  Food Insecurity: Not on file  Transportation Needs: Not on file  Physical  Activity: Not on file  Stress: Not on file  Social Connections: Not on file  Intimate Partner Violence: Not on file   Family History: No family history on file.  Review of Systems: Constitutional: Doesn't report fevers, chills or abnormal weight loss Eyes: Doesn't report blurriness of vision Ears, nose, mouth, throat, and face: Doesn't report sore throat Respiratory: Doesn't report cough, dyspnea or wheezes Cardiovascular: Doesn't report palpitation, chest discomfort  Gastrointestinal:  Doesn't report nausea, constipation, diarrhea GU: Doesn't report incontinence Skin: torso rash Neurological: Per HPI Musculoskeletal: Doesn't report joint pain Behavioral/Psych: Doesn't report anxiety  Physical Exam: Vitals:   05/02/21 0902  BP: (!) 156/101  Pulse: (!) 52  Resp: 17  Temp: 98.7 F (  37.1 C)  SpO2: 99%    KPS: 70. General: Comfortable, NAD Head: Normal EENT: No conjunctival injection or scleral icterus.  Lungs: Resp effort normal Cardiac: Regular rate Abdomen: Non-distended abdomen Skin: normal Extremities: normal  Neurologic Exam: Mental Status: Awake, alert, attentive to examiner. Oriented to self and environment. Language c/w transcortical expressive dyshpasia Cranial Nerves: Visual acuity is grossly normal. Right field hemianopia. Extra-ocular movements intact. No ptosis. Face is symmetric Motor: Tone and bulk are normal. 4+/5 in right arm and leg. Reflexes are symmetric, no pathologic reflexes present.  Sensory: Intact to light touch Gait: Dystaxic, hemiparetic  Labs: I have reviewed the data as listed    Component Value Date/Time   NA 139 04/18/2021 1156   K 5.1 04/18/2021 1156   CL 108 04/18/2021 1156   CO2 22 04/18/2021 1156   GLUCOSE 91 04/18/2021 1156   BUN 21 (H) 04/18/2021 1156   CREATININE 0.86 04/18/2021 1156   CALCIUM 9.6 04/18/2021 1156   PROT 7.4 04/18/2021 1156   ALBUMIN 3.8 04/18/2021 1156   AST 24 04/18/2021 1156   ALT 26 04/18/2021 1156    ALKPHOS 46 04/18/2021 1156   BILITOT 0.8 04/18/2021 1156   GFRNONAA >60 04/18/2021 1156   GFRAA >60 06/14/2020 0828   Lab Results  Component Value Date   WBC 2.8 (L) 05/02/2021   NEUTROABS 1.1 (L) 05/02/2021   HGB 14.5 05/02/2021   HCT 42.1 05/02/2021   MCV 88.3 05/02/2021   PLT 165 05/02/2021    Imaging:  [facility] Clinician Interpretation: I have personally reviewed the CNS images as listed.  My interpretation, in the context of the patient's clinical presentation, is progressive disease  MR BRAIN W WO CONTRAST  Result Date: 04/12/2021 CLINICAL DATA:  Glioblastoma follow-up EXAM: MRI HEAD WITHOUT AND WITH CONTRAST TECHNIQUE: Multiplanar, multiecho pulse sequences of the brain and surrounding structures were obtained without and with intravenous contrast. CONTRAST:  45m GADAVIST GADOBUTROL 1 MMOL/ML IV SOLN COMPARISON:  02/01/2021 FINDINGS: Brain: Heterogeneously enhancing and largely necrotic mass of the left parietal and temporal lobes has increased in size. The most medial area contrast enhancement now measures 2.1 x 1.0 cm. The more lateral enhancing area now measures 1.5 x 2.5 cm. The overall size of the abnormal area is slightly larger, measuring 7.1 x 4.5 cm, previously 6.3 x 3.4 cm (series 12, image 75). Area hyperintense T2-weighted signal is slightly worsened particularly in the left temporal lobe. Multifocal chronic hemorrhage at the site of the mass is unchanged. There is no remote area of abnormal contrast enhancement. The midline structures are normal. Vascular: Major flow voids are preserved. Skull and upper cervical spine: Remote left craniotomy. Sinuses/Orbits:No paranasal sinus fluid levels or advanced mucosal thickening. No mastoid or middle ear effusion. Normal orbits. IMPRESSION: Slight increase in size of left parietal and temporal tumor with slight worsening of hyperintense T2-weighted signal in the left temporal lobe. Electronically Signed   By: KUlyses JarredM.D.   On:  04/12/2021 19:28    Assessment/Plan Glioblastoma, IDH-wildtype ([place] [C71.9]  MJovanny Stephanieis clinically stable today.  He tolerated first infusion well without significant complication.  Neutrophils are low today, but within safe range for administration of treatment plan.  We recommended proceeding with Irinotecan 1218mm2 IV q2 weeks, concurrent with Avastin 1059mg IV q2 weeks.  He demonstrated dramatic cytopenias with higher dose Temodar, which gives us Kore[place]itation regarding further alkylating chemotherapy.  Irinotecan is less likely to contribute to cytopenias; we reviewed management for diarrhea- dose 4mg67m  then 36m q2 hours until improvement.     Chemotherapy should be held for the following:  ANC less than 1,000  Platelets less than 100,000  LFT or creatinine greater than 2x ULN  If clinical concerns/contraindications develop  Avastin should be held for the following:  ANC less than 1500  Platelets less than 50,000  LFT or creatinine greater than 2x ULN  If clinical concerns/contraindications develop  Because of ongoing clinical decline, we also discussed and patient consented for additional tumor profiling and sequencing through [place]  Advanced tumor profiling could help identify actionable mutation for targeted therapy and lead to direct clinical benefit.  He is agreeable with this plan.  For hypertension, Metoprolol was increased to 729mtwice per day.   Ok to continue Depakote 50056mID, no breathrough seizures.  We ask that MarLamonte Harttturn to in-person clinic in 2 weeks for day 1 of cycle #2.  MRI will be scheduled for ~September 16th, or sooner if needed.  All questions were answered. The patient knows to call the clinic with any problems, questions or concerns. No barriers to learning were detected.  I have spent a total of 40 minutes of face-to-face and non-face-to-face time, excluding clinical staff time, preparing to see patient, ordering tests and/or  medications, counseling the patient, and documenting clinical information in the electronic or other health record    ZacVentura SellersD Medical Director of Neuro-Oncology [facility] WesTerminous/11/22 9:09 AM

## 2021-05-02 NOTE — Patient Instructions (Signed)
[facility]   Discharge Instructions: Thank you for choosing [facility] to provide your oncology and hematology care.   If you have a lab appointment with the [facility], please go directly to the [facility] and check in at the registration area.   Wear comfortable clothing and clothing appropriate for easy access to any Portacath or PICC line.   We strive to give you quality time with your provider. You may need to reschedule your appointment if you arrive late (15 or more minutes).  Arriving late affects you and other patients whose appointments are after yours.  Also, if you miss three or more appointments without notifying the office, you may be dismissed from the clinic at the provider's discretion.      For prescription refill requests, have your pharmacy contact our office and allow 72 hours for refills to be completed.    Today you received the following chemotherapy and/or immunotherapy agents: bevacizumab and irinotecan.   To help prevent nausea and vomiting after your treatment, we encourage you to take your nausea medication as directed.  BELOW ARE SYMPTOMS THAT SHOULD BE REPORTED IMMEDIATELY: *FEVER GREATER THAN 100.4 F (38 C) OR HIGHER *CHILLS OR SWEATING *NAUSEA AND VOMITING THAT IS NOT CONTROLLED WITH YOUR NAUSEA MEDICATION *UNUSUAL SHORTNESS OF BREATH *UNUSUAL BRUISING OR BLEEDING *URINARY PROBLEMS (pain or burning when urinating, or frequent urination) *BOWEL PROBLEMS (unusual diarrhea, constipation, pain near the anus) TENDERNESS IN MOUTH AND THROAT WITH OR WITHOUT PRESENCE OF ULCERS (sore throat, sores in mouth, or a toothache) UNUSUAL RASH, SWELLING OR PAIN  UNUSUAL VAGINAL DISCHARGE OR ITCHING   Items with * indicate a potential emergency and should be followed up as soon as possible or go to the Emergency Department if any problems should occur.  Please show the CHEMOTHERAPY ALERT CARD or IMMUNOTHERAPY ALERT  CARD at check-in to the Emergency Department and triage nurse.  Should you have questions after your visit or need to cancel or reschedule your appointment, please contact [facility]  Dept: [phone]  and follow the prompts.  Office hours are 8:00 a.m. to 4:30 p.m. Monday - Friday. Please note that voicemails left after 4:00 p.m. may not be returned until the following business day.  We are closed weekends and major holidays. You have access to a nurse at all times for urgent questions. Please call the main number to the clinic Dept: [phone] and follow the prompts.   For any non-urgent questions, you may also contact your provider using MyChart. We now offer e-Visits for anyone 40 and older to request care online for non-urgent symptoms. For details visit [email or web address].   Also download the MyChart app! Go to the app store, search "MyChart", open the app, select [facility], and log in with your MyChart username and password.  Due to Covid, a mask is required upon entering the hospital/clinic. If you do not have a mask, one will be given to you upon arrival. For doctor visits, patients may have 1 support person aged 29 or older with them. For treatment visits, patients cannot have anyone with them due to current Covid guidelines and our immunocompromised population.   Irinotecan injection What is this medication? IRINOTECAN (ir in oh TEE kan ) is a chemotherapy drug. It is used to treatcolon and rectal cancer. This medicine may be used for other purposes; ask your health care provider orpharmacist if you have questions. COMMON BRAND NAME(S): Camptosar What should  I tell my care team before I take this medication? They need to know if you have any of these conditions: dehydration diarrhea infection (especially a virus infection such as chickenpox, cold sores, or herpes) liver disease low blood counts, like low white cell, platelet, or red cell  counts low levels of calcium, magnesium, or potassium in the blood recent or ongoing radiation therapy an unusual or allergic reaction to irinotecan, other medicines, foods, dyes, or preservatives pregnant or trying to get pregnant breast-feeding How should I use this medication? This drug is given as an infusion into a vein. It is administered in a hospitalor clinic by a specially trained health care professional. Talk to your pediatrician regarding the use of this medicine in children.Special care may be needed. Overdosage: If you think you have taken too much of this medicine contact apoison control center or emergency room at once. NOTE: This medicine is only for you. Do not share this medicine with others. What if I miss a dose? It is important not to miss your dose. Call your doctor or health careprofessional if you are unable to keep an appointment. What may interact with this medication? Do not take this medicine with any of the following medications: cobicistat itraconazole This medicine may interact with the following medications: antiviral medicines for HIV or AIDS certain antibiotics like rifampin or rifabutin certain medicines for fungal infections like ketoconazole, posaconazole, and voriconazole certain medicines for seizures like carbamazepine, phenobarbital, phenotoin clarithromycin gemfibrozil nefazodone [facility] John's Wort This list may not describe all possible interactions. Give your health care provider a list of all the medicines, herbs, non-prescription drugs, or dietary supplements you use. Also tell them if you smoke, drink alcohol, or use illegaldrugs. Some items may interact with your medicine. What should I watch for while using this medication? Your condition will be monitored carefully while you are receiving this medicine. You will need important blood work done while you are taking thismedicine. This drug may make you feel generally unwell. This is not  uncommon, as chemotherapy can affect healthy cells as well as cancer cells. Report any side effects. Continue your course of treatment even though you feel ill unless yourdoctor tells you to stop. In some cases, you may be given additional medicines to help with side effects.Follow all directions for their use. You may get drowsy or dizzy. Do not drive, use machinery, or do anything that needs mental alertness until you know how this medicine affects you. Do not stand or sit up quickly, especially if you are an older patient. This reducesthe risk of dizzy or fainting spells. Call your health care professional for advice if you get a fever, chills, or sore throat, or other symptoms of a cold or flu. Do not treat yourself. This medicine decreases your body's ability to fight infections. Try to avoid beingaround people who are sick. Avoid taking products that contain aspirin, acetaminophen, ibuprofen, naproxen, or ketoprofen unless instructed by your doctor. These medicines may hide afever. This medicine may increase your risk to bruise or bleed. Call your doctor orhealth care professional if you notice any unusual bleeding. Be careful brushing and flossing your teeth or using a toothpick because you may get an infection or bleed more easily. If you have any dental work done,tell your dentist you are receiving this medicine. Do not become pregnant while taking this medicine or for 6 months after stopping it. Women should inform their health care professional if they wish to become pregnant or think they  might be pregnant. Men should not father a child while taking this medicine and for 3 months after stopping it. There is potential for serious side effects to an unborn child. Talk to your health careprofessional for more information. Do not breast-feed an infant while taking this medicine or for 7 days afterstopping it. This medicine has caused ovarian failure in some women. This medicine may make it more  difficult to get pregnant. Talk to your health care professional if [name] concerned about your fertility. This medicine has caused decreased sperm counts in some men. This may make it more difficult to father a child. Talk to your health care professional if [name] concerned about your fertility. What side effects may I notice from receiving this medication? Side effects that you should report to your doctor or health care professionalas soon as possible: allergic reactions like skin rash, itching or hives, swelling of the face, lips, or tongue chest pain diarrhea flushing, runny nose, sweating during infusion low blood counts - this medicine may decrease the number of white blood cells, red blood cells and platelets. You may be at increased risk for infections and bleeding. nausea, vomiting pain, swelling, warmth in the leg signs of decreased platelets or bleeding - bruising, pinpoint red spots on the skin, black, tarry stools, blood in the urine signs of infection - fever or chills, cough, sore throat, pain or difficulty passing urine signs of decreased red blood cells - unusually weak or tired, fainting spells, lightheadedness Side effects that usually do not require medical attention (report to yourdoctor or health care professional if they continue or are bothersome): constipation hair loss headache loss of appetite mouth sores stomach pain This list may not describe all possible side effects. Call your doctor for medical advice about side effects. You may report side effects to FDA at1-800-FDA-1088. Where should I keep my medication? This drug is given in a hospital or clinic and will not be stored at home. NOTE: This sheet is a summary. It may not cover all possible information. If you have questions about this medicine, talk to your doctor, pharmacist, orhealth care provider.  2022 Elsevier/Gold Standard (2019-08-09 17:46:13)

## 2021-05-06 ENCOUNTER — Telehealth: Payer: Self-pay | Admitting: Internal Medicine

## 2021-05-06 NOTE — Telephone Encounter (Signed)
Scheduled appts per 8/11 los. Pt's wife is aware.  

## 2021-05-16 ENCOUNTER — Inpatient Hospital Stay (HOSPITAL_BASED_OUTPATIENT_CLINIC_OR_DEPARTMENT_OTHER): Payer: 59 | Admitting: Internal Medicine

## 2021-05-16 ENCOUNTER — Inpatient Hospital Stay: Payer: 59

## 2021-05-16 ENCOUNTER — Other Ambulatory Visit: Payer: Self-pay

## 2021-05-16 VITALS — BP 132/89 | HR 50

## 2021-05-16 VITALS — BP 134/98 | HR 58 | Temp 98.8°F | Resp 20 | Wt 191.8 lb

## 2021-05-16 DIAGNOSIS — C719 Malignant neoplasm of brain, unspecified: Secondary | ICD-10-CM | POA: Diagnosis not present

## 2021-05-16 DIAGNOSIS — Z5112 Encounter for antineoplastic immunotherapy: Secondary | ICD-10-CM | POA: Diagnosis not present

## 2021-05-16 DIAGNOSIS — R569 Unspecified convulsions: Secondary | ICD-10-CM

## 2021-05-16 LAB — CMP (CANCER CENTER ONLY)
ALT: 24 U/L (ref 0–44)
AST: 19 U/L (ref 15–41)
Albumin: 3.6 g/dL (ref 3.5–5.0)
Alkaline Phosphatase: 49 U/L (ref 38–126)
Anion gap: 8 (ref 5–15)
BUN: 16 mg/dL (ref 6–20)
CO2: 22 mmol/L (ref 22–32)
Calcium: 9.1 mg/dL (ref 8.9–10.3)
Chloride: 110 mmol/L (ref 98–111)
Creatinine: 0.84 mg/dL (ref 0.61–1.24)
GFR, Estimated: >60 mL/min (ref >60)
Glucose, Bld: 118 mg/dL — ABNORMAL HIGH (ref 70–99)
Potassium: 4.3 mmol/L (ref 3.5–5.1)
Sodium: 140 mmol/L (ref 135–145)
Total Bilirubin: 0.6 mg/dL (ref 0.3–1.2)
Total Protein: 6.9 g/dL (ref 6.5–8.1)

## 2021-05-16 LAB — CBC WITH DIFFERENTIAL (CANCER CENTER ONLY)
Abs Immature Granulocytes: 0.06 10*3/uL (ref 0.00–0.07)
Basophils Absolute: 0 10*3/uL (ref 0.0–0.1)
Basophils Relative: 1 %
Eosinophils Absolute: 0 10*3/uL (ref 0.0–0.5)
Eosinophils Relative: 1 %
HCT: 42.2 % (ref 39.0–52.0)
Hemoglobin: 14.5 g/dL (ref 13.0–17.0)
Immature Granulocytes: 2 %
Lymphocytes Relative: 33 %
Lymphs Abs: 0.9 10*3/uL (ref 0.7–4.0)
MCH: 30.6 pg (ref 26.0–34.0)
MCHC: 34.4 g/dL (ref 30.0–36.0)
MCV: 89 fL (ref 80.0–100.0)
Monocytes Absolute: 0.5 10*3/uL (ref 0.1–1.0)
Monocytes Relative: 19 %
Neutro Abs: 1.2 10*3/uL — ABNORMAL LOW (ref 1.7–7.7)
Neutrophils Relative %: 44 %
Platelet Count: 163 10*3/uL (ref 150–400)
RBC: 4.74 MIL/uL (ref 4.22–5.81)
RDW: 14.5 % (ref 11.5–15.5)
WBC Count: 2.8 10*3/uL — ABNORMAL LOW (ref 4.0–10.5)
nRBC: 0 % (ref 0.0–0.2)

## 2021-05-16 LAB — TOTAL PROTEIN, URINE DIPSTICK: Protein, ur: NEGATIVE mg/dL

## 2021-05-16 MED ORDER — SODIUM CHLORIDE 0.9 % IV SOLN: Freq: Once | INTRAVENOUS | Status: AC

## 2021-05-16 MED ORDER — ATROPINE SULFATE 1 MG/ML IJ SOLN
1.0000 mg | Freq: Once | INTRAMUSCULAR | Status: DC | PRN
  Filled 2021-05-16: qty 1

## 2021-05-16 MED ORDER — SODIUM CHLORIDE 0.9 % IV SOLN
10.0000 mg/kg | Freq: Once | INTRAVENOUS | Status: AC
  Administered 2021-05-16: 900 mg via INTRAVENOUS
  Filled 2021-05-16: qty 32

## 2021-05-16 MED ORDER — PALONOSETRON HCL INJECTION 0.25 MG/5ML
0.2500 mg | Freq: Once | INTRAVENOUS | Status: AC
  Administered 2021-05-16: 0.25 mg via INTRAVENOUS
  Filled 2021-05-16: qty 5

## 2021-05-16 MED ORDER — SODIUM CHLORIDE 0.9 % IV SOLN
125.0000 mg/m2 | Freq: Once | INTRAVENOUS | Status: AC
  Administered 2021-05-16: 260 mg via INTRAVENOUS
  Filled 2021-05-16: qty 13

## 2021-05-16 MED ORDER — SODIUM CHLORIDE 0.9 % IV SOLN
10.0000 mg | Freq: Once | INTRAVENOUS | Status: AC
  Administered 2021-05-16: 10 mg via INTRAVENOUS
  Filled 2021-05-16: qty 10

## 2021-05-16 NOTE — Progress Notes (Signed)
05/16/2021 Per Dr [name] ok to treat with ANC 1.2 today.

## 2021-05-16 NOTE — Progress Notes (Signed)
 [facility] [address]  [place], [place] [address] [phone]   Interval Evaluation  Date of Service: 05/16/21 Patient Name: Christopher Tucker Patient MRN: [id number] Patient DOB: 05/12/1961 Provider:  K , MD  Identifying Statement:  Christopher Tucker is a 60 y.o. male with left temporal anaplastic astrocytoma   Oncologic History: Oncology History  Glioblastoma, IDH-wildtype (HCC)  05/14/2020 Initial Diagnosis   Anaplastic astrocytoma, IDH-wildtype (HCC)   05/21/2020 - 07/02/2020 Radiation Therapy   IMRT and concurrent Temodar with Dr. Moody   07/30/2020 - 08/07/2020 Chemotherapy    Patient is on Treatment Plan: BRAIN GBM DUKE IRINOTECAN + BEVACIZUMAB D1,15 Q 28D   Patient is on Antibody Plan: BRAIN GBM BEVACIZUMAB 14D X 6 CYCLES     10/18/2020 -  Chemotherapy    Patient is on Treatment Plan: BRAIN ANAPLASTIC GLIOMA GRADE III TEMOZOLOMIDE POST XRT Q28D   Patient is on Antibody Plan: BRAIN GBM BEVACIZUMAB 14D X 6 CYCLES     04/18/2021 -  Chemotherapy    Patient is on Treatment Plan: BRAIN GBM DUKE IRINOTECAN + BEVACIZUMAB D1,15 Q 28D   Patient is on Antibody Plan: BRAIN GBM BEVACIZUMAB 14D X 6 CYCLES        Biomarkers:  MGMT Unknown.  IDH 1/2 Wild type.  EGFR Unknown  TERT "Mutated   Interval History:  Christopher Tucker presents today for third infusion of Irinotecan+Avastin.  No issues with the last cycle, although he has experienced diarrhea occasionally during the past two weeks. He describes overall stability in general confusion and word finding difficulty.  Gait remains independent, with assistance nearby if needed.  Denies seizures or headaches.  Depakote still at 500mg twice per day, no further seizures.    Decadron: 10/05/20: 8mg 10/19/20: 4mg 11/15/20: 1mg 11/23/20: -  H+P (05/10/20) Patient presented to medical attention in early August with several months history of sensory episodes.  They are described as "numbness  marching down right arm also involving the leg, lasting for several minutes".  There is no post event weakness or confusion.  No known history of seizures.  In recent weeks he has also experienced some difficulty with expressive language, finding particular words and maintaining high fluency.  Since starting Keppra post-operatively he has not had any recurrence of these sensory events.  He has weaned off dexamethasone.  We are still awaiting finalized pathology results.    Medications: Current Outpatient Medications on File Prior to Visit  Medication Sig Dispense Refill   amLODipine (NORVASC) 2.5 MG tablet Take 2.5 mg by mouth daily.     divalproex (DEPAKOTE) 500 MG DR tablet Take 1 tablet (500 mg total) by mouth 2 (two) times daily. 120 tablet 3   dronabinol (MARINOL) 2.5 MG capsule Take 1 capsule (2.5 mg total) by mouth 2 (two) times daily before a meal. (Patient not taking: No sig reported) 60 capsule 2   gabapentin (NEURONTIN) 300 MG capsule Take 1 capsule (300 mg total) by mouth 2 (two) times daily. (Patient not taking: No sig reported) 60 capsule 3   hydrocortisone (CORTEF) 10 MG tablet TAKE 1 TABLET BY MOUTH TWICE A DAY (Patient not taking: No sig reported) 60 tablet 1   ibuprofen (ADVIL) 200 MG tablet Take 400 mg by mouth 2 (two) times daily. (Patient not taking: No sig reported)     lisinopril (ZESTRIL) 20 MG tablet Take 2 tablets (40 mg total) by mouth daily. 120 tablet 3   loperamide (IMODIUM A-D) 2 MG   tablet Take 1 tablet (2 mg total) by mouth 4 (four) times daily as needed. Take 2 at diarrhea onset , then 1 every 2hr until 12hrs with no BM. May take 2 every 4hrs at night. If diarrhea recurs repeat. 100 tablet 1   methylphenidate (RITALIN) 5 MG tablet Take 1 tablet (5 mg total) by mouth 2 (two) times daily. (Patient not taking: No sig reported) 60 tablet 0   metoprolol tartrate 75 MG TABS Take 75 mg by mouth 2 (two) times daily. 60 tablet 2   prochlorperazine (COMPAZINE) 10 MG tablet Take  1 tablet (10 mg total) by mouth every 6 (six) hours as needed for nausea or vomiting. 30 tablet 0   sertraline (ZOLOFT) 25 MG tablet TAKE 1 TABLET (25 MG TOTAL) BY MOUTH DAILY. 30 tablet 3   simvastatin (ZOCOR) 40 MG tablet Take 40 mg by mouth at bedtime. (Patient not taking: No sig reported)     No current facility-administered medications on file prior to visit.    Allergies:  Allergies  Allergen Reactions   Pravastatin     Other reaction(s): muscle aches   Past Medical History:  Past Medical History:  Diagnosis Date   High cholesterol    History of kidney stones    Past Surgical History:  Past Surgical History:  Procedure Laterality Date   APPLICATION OF CRANIAL NAVIGATION Left 04/25/2020   Procedure: APPLICATION OF CRANIAL NAVIGATION;  Surgeon: Consuella Lose, MD;  Location: [facility];  Service: Neurosurgery;  Laterality: Left;   CRANIOTOMY Left 04/25/2020   Procedure: STEREOTACTIC LEFT TEMPORAL CRANIOTOMY FOR TUMOR;  Surgeon: Consuella Lose, MD;  Location: [facility];  Service: Neurosurgery;  Laterality: Left;   HERNIA REPAIR Left    groin    Social History:  Social History   Socioeconomic History   Marital status: Married    Spouse name: Not on file   Number of children: Not on file   Years of education: Not on file   Highest education level: Not on file  Occupational History   Not on file  Tobacco Use   Smoking status: Never   Smokeless tobacco: Never  Substance and Sexual Activity   Alcohol use: Yes    Alcohol/week: 6.0 standard drinks    Types: 3 Glasses of wine, 3 Cans of beer per week   Drug use: Not Currently   Sexual activity: Not on file  Other Topics Concern   Not on file  Social History Narrative   Not on file   Social Determinants of Health   Financial Resource Strain: Not on file  Food Insecurity: Not on file  Transportation Needs: Not on file  Physical Activity: Not on file  Stress: Not on file  Social Connections: Not on file  Intimate  Partner Violence: Not on file   Family History: No family history on file.  Review of Systems: Constitutional: Doesn't report fevers, chills or abnormal weight loss Eyes: Doesn't report blurriness of vision Ears, nose, mouth, throat, and face: Doesn't report sore throat Respiratory: Doesn't report cough, dyspnea or wheezes Cardiovascular: Doesn't report palpitation, chest discomfort  Gastrointestinal:  Doesn't report nausea, constipation, diarrhea GU: Doesn't report incontinence Skin: torso rash Neurological: Per HPI Musculoskeletal: Doesn't report joint pain Behavioral/Psych: Doesn't report anxiety  Physical Exam: Vitals:   05/16/21 1110  BP: (!) 134/98  Pulse: (!) 58  Resp: 20  Temp: 98.8 F (37.1 C)  SpO2: 98%    KPS: 70. General: Comfortable, NAD Head: Normal EENT: No conjunctival injection  or scleral icterus.  Lungs: Resp effort normal Cardiac: Regular rate Abdomen: Non-distended abdomen Skin: normal Extremities: normal  Neurologic Exam: Mental Status: Awake, alert, attentive to examiner. Oriented to self and environment. Language c/w transcortical expressive dyshpasia Cranial Nerves: Visual acuity is grossly normal. Right field hemianopia. Extra-ocular movements intact. No ptosis. Face is symmetric Motor: Tone and bulk are normal. 4+/5 in right arm and leg. Reflexes are symmetric, no pathologic reflexes present.  Sensory: Intact to light touch Gait: Dystaxic, hemiparetic  Labs: I have reviewed the data as listed    Component Value Date/Time   NA 142 05/02/2021 0844   K 4.5 05/02/2021 0844   CL 109 05/02/2021 0844   CO2 25 05/02/2021 0844   GLUCOSE 80 05/02/2021 0844   BUN 17 05/02/2021 0844   CREATININE 0.80 05/02/2021 0844   CALCIUM 9.0 05/02/2021 0844   PROT 6.8 05/02/2021 0844   ALBUMIN 3.6 05/02/2021 0844   AST 15 05/02/2021 0844   ALT 19 05/02/2021 0844   ALKPHOS 46 05/02/2021 0844   BILITOT 0.7 05/02/2021 0844   GFRNONAA >60 05/02/2021 0844    GFRAA >60 06/14/2020 0828   Lab Results  Component Value Date   WBC 2.8 (L) 05/16/2021   NEUTROABS 1.2 (L) 05/16/2021   HGB 14.5 05/16/2021   HCT 42.2 05/16/2021   MCV 89.0 05/16/2021   PLT 163 05/16/2021     Assessment/Plan Glioblastoma, IDH-wildtype (HCC) [C71.9]  Christopher Tucker is clinically stable today.  No new or progressive deficits.  Neutrophils are again low today, but within safe range for administration of treatment plan.  We recommended proceeding with Irinotecan 125mg/m2 IV q2 weeks, concurrent with Avastin 10mg/kg IV q2 weeks.  He demonstrated dramatic cytopenias with higher dose Temodar, which gives us hesitation regarding further alkylating chemotherapy.  Irinotecan is less likely to contribute to cytopenias; we reviewed management for diarrhea- dose 4mg, then 2mg q2 hours until improvement.     Chemotherapy should be held for the following:  ANC less than 1,000  Platelets less than 100,000  LFT or creatinine greater than 2x ULN  If clinical concerns/contraindications develop  Avastin should be held for the following:  ANC less than 1500  Platelets less than 50,000  LFT or creatinine greater than 2x ULN  If clinical concerns/contraindications develop  Pending CARIS sequencing results.  For hypertension, Metoprolol was increased to 75mg twice per day.   Ok to continue Depakote 500mg BID, no breathrough seizures.  We ask that Broadus Koplin return to in-person clinic in 2 weeks for 4th treatment.  MRI will be scheduled for ~September 16th, or sooner if needed.  All questions were answered. The patient knows to call the clinic with any problems, questions or concerns. No barriers to learning were detected.  I have spent a total of 30 minutes of face-to-face and non-face-to-face time, excluding clinical staff time, preparing to see patient, ordering tests and/or medications, counseling the patient, and documenting clinical information in the electronic or  other health record     K , MD Medical Director of Neuro-Oncology [facility] 05/16/21 11:19 AM 

## 2021-05-16 NOTE — Patient Instructions (Signed)
[facility]  Discharge Instructions: Thank you for choosing [facility] to provide your oncology and hematology care.   If you have a lab appointment with the [facility], please go directly to the [facility] and check in at the registration area.   Wear comfortable clothing and clothing appropriate for easy access to any Portacath or PICC line.   We strive to give you quality time with your provider. You may need to reschedule your appointment if you arrive late (15 or more minutes).  Arriving late affects you and other patients whose appointments are after yours.  Also, if you miss three or more appointments without notifying the office, you may be dismissed from the clinic at the provider's discretion.      For prescription refill requests, have your pharmacy contact our office and allow 72 hours for refills to be completed.    Today you received the following chemotherapy and/or immunotherapy agents: Bevacizumab and Irinotecan.    To help prevent nausea and vomiting after your treatment, we encourage you to take your nausea medication as directed.  BELOW ARE SYMPTOMS THAT SHOULD BE REPORTED IMMEDIATELY: *FEVER GREATER THAN 100.4 F (38 C) OR HIGHER *CHILLS OR SWEATING *NAUSEA AND VOMITING THAT IS NOT CONTROLLED WITH YOUR NAUSEA MEDICATION *UNUSUAL SHORTNESS OF BREATH *UNUSUAL BRUISING OR BLEEDING *URINARY PROBLEMS (pain or burning when urinating, or frequent urination) *BOWEL PROBLEMS (unusual diarrhea, constipation, pain near the anus) TENDERNESS IN MOUTH AND THROAT WITH OR WITHOUT PRESENCE OF ULCERS (sore throat, sores in mouth, or a toothache) UNUSUAL RASH, SWELLING OR PAIN  UNUSUAL VAGINAL DISCHARGE OR ITCHING   Items with * indicate a potential emergency and should be followed up as soon as possible or go to the Emergency Department if any problems should occur.  Please show the CHEMOTHERAPY ALERT CARD or IMMUNOTHERAPY ALERT CARD  at check-in to the Emergency Department and triage nurse.  Should you have questions after your visit or need to cancel or reschedule your appointment, please contact [facility]  Dept: [phone]  and follow the prompts.  Office hours are 8:00 a.m. to 4:30 p.m. Monday - Friday. Please note that voicemails left after 4:00 p.m. may not be returned until the following business day.  We are closed weekends and major holidays. You have access to a nurse at all times for urgent questions. Please call the main number to the clinic Dept: [phone] and follow the prompts.   For any non-urgent questions, you may also contact your provider using MyChart. We now offer e-Visits for anyone 73 and older to request care online for non-urgent symptoms. For details visit [email or web address].   Also download the MyChart app! Go to the app store, search "MyChart", open the app, select [facility], and log in with your MyChart username and password.  Due to Covid, a mask is required upon entering the hospital/clinic. If you do not have a mask, one will be given to you upon arrival. For doctor visits, patients may have 1 support person aged 20 or older with them. For treatment visits, patients cannot have anyone with them due to current Covid guidelines and our immunocompromised population.

## 2021-05-30 ENCOUNTER — Ambulatory Visit: Payer: 59 | Admitting: Internal Medicine

## 2021-05-30 ENCOUNTER — Inpatient Hospital Stay: Payer: 59 | Attending: Neurosurgery

## 2021-05-30 ENCOUNTER — Inpatient Hospital Stay (HOSPITAL_BASED_OUTPATIENT_CLINIC_OR_DEPARTMENT_OTHER): Payer: 59 | Admitting: Internal Medicine

## 2021-05-30 ENCOUNTER — Inpatient Hospital Stay: Payer: 59 | Admitting: Internal Medicine

## 2021-05-30 ENCOUNTER — Inpatient Hospital Stay: Payer: 59

## 2021-05-30 ENCOUNTER — Encounter: Payer: Self-pay | Admitting: Internal Medicine

## 2021-05-30 ENCOUNTER — Other Ambulatory Visit: Payer: Self-pay

## 2021-05-30 ENCOUNTER — Other Ambulatory Visit: Payer: 59

## 2021-05-30 VITALS — BP 137/98 | HR 60 | Temp 96.8°F | Resp 18 | Wt 193.4 lb

## 2021-05-30 DIAGNOSIS — Z79899 Other long term (current) drug therapy: Secondary | ICD-10-CM | POA: Insufficient documentation

## 2021-05-30 DIAGNOSIS — Z5112 Encounter for antineoplastic immunotherapy: Secondary | ICD-10-CM | POA: Insufficient documentation

## 2021-05-30 DIAGNOSIS — Z923 Personal history of irradiation: Secondary | ICD-10-CM | POA: Insufficient documentation

## 2021-05-30 DIAGNOSIS — I10 Essential (primary) hypertension: Secondary | ICD-10-CM | POA: Diagnosis not present

## 2021-05-30 DIAGNOSIS — R569 Unspecified convulsions: Secondary | ICD-10-CM | POA: Diagnosis not present

## 2021-05-30 DIAGNOSIS — F1020 Alcohol dependence, uncomplicated: Secondary | ICD-10-CM | POA: Insufficient documentation

## 2021-05-30 DIAGNOSIS — Z7952 Long term (current) use of systemic steroids: Secondary | ICD-10-CM | POA: Diagnosis not present

## 2021-05-30 DIAGNOSIS — Z87442 Personal history of urinary calculi: Secondary | ICD-10-CM | POA: Diagnosis not present

## 2021-05-30 DIAGNOSIS — C719 Malignant neoplasm of brain, unspecified: Secondary | ICD-10-CM

## 2021-05-30 LAB — CBC WITH DIFFERENTIAL (CANCER CENTER ONLY)
Abs Immature Granulocytes: 0.08 10*3/uL — ABNORMAL HIGH (ref 0.00–0.07)
Basophils Absolute: 0 10*3/uL (ref 0.0–0.1)
Basophils Relative: 1 %
Eosinophils Absolute: 0 10*3/uL (ref 0.0–0.5)
Eosinophils Relative: 1 %
HCT: 42.5 % (ref 39.0–52.0)
Hemoglobin: 14.5 g/dL (ref 13.0–17.0)
Immature Granulocytes: 2 %
Lymphocytes Relative: 29 %
Lymphs Abs: 1.1 10*3/uL (ref 0.7–4.0)
MCH: 30.8 pg (ref 26.0–34.0)
MCHC: 34.1 g/dL (ref 30.0–36.0)
MCV: 90.2 fL (ref 80.0–100.0)
Monocytes Absolute: 0.7 10*3/uL (ref 0.1–1.0)
Monocytes Relative: 19 %
Neutro Abs: 1.8 10*3/uL (ref 1.7–7.7)
Neutrophils Relative %: 48 %
Platelet Count: 171 10*3/uL (ref 150–400)
RBC: 4.71 MIL/uL (ref 4.22–5.81)
RDW: 15.3 % (ref 11.5–15.5)
WBC Count: 3.8 10*3/uL — ABNORMAL LOW (ref 4.0–10.5)
nRBC: 0 % (ref 0.0–0.2)

## 2021-05-30 LAB — CMP (CANCER CENTER ONLY)
ALT: 18 U/L (ref 0–44)
AST: 15 U/L (ref 15–41)
Albumin: 3.7 g/dL (ref 3.5–5.0)
Alkaline Phosphatase: 47 U/L (ref 38–126)
Anion gap: 11 (ref 5–15)
BUN: 15 mg/dL (ref 6–20)
CO2: 22 mmol/L (ref 22–32)
Calcium: 9.3 mg/dL (ref 8.9–10.3)
Chloride: 108 mmol/L (ref 98–111)
Creatinine: 0.86 mg/dL (ref 0.61–1.24)
GFR, Estimated: >60 mL/min (ref >60)
Glucose, Bld: 110 mg/dL — ABNORMAL HIGH (ref 70–99)
Potassium: 4.1 mmol/L (ref 3.5–5.1)
Sodium: 141 mmol/L (ref 135–145)
Total Bilirubin: 0.7 mg/dL (ref 0.3–1.2)
Total Protein: 7 g/dL (ref 6.5–8.1)

## 2021-05-30 LAB — TOTAL PROTEIN, URINE DIPSTICK: Protein, ur: NEGATIVE mg/dL

## 2021-05-30 MED ORDER — SODIUM CHLORIDE 0.9 % IV SOLN
10.0000 mg | Freq: Once | INTRAVENOUS | Status: AC
  Administered 2021-05-30: 10 mg via INTRAVENOUS
  Filled 2021-05-30: qty 10

## 2021-05-30 MED ORDER — IRINOTECAN HCL CHEMO INJECTION 100 MG/5ML
125.0000 mg/m2 | Freq: Once | INTRAVENOUS | Status: AC
  Administered 2021-05-30: 260 mg via INTRAVENOUS
  Filled 2021-05-30: qty 13

## 2021-05-30 MED ORDER — SODIUM CHLORIDE 0.9 % IV SOLN
10.0000 mg/kg | Freq: Once | INTRAVENOUS | Status: AC
  Administered 2021-05-30: 900 mg via INTRAVENOUS
  Filled 2021-05-30: qty 32

## 2021-05-30 MED ORDER — ATROPINE SULFATE 1 MG/ML IJ SOLN: 1.0000 mg | Freq: Once | INTRAMUSCULAR | Status: DC | PRN

## 2021-05-30 MED ORDER — SODIUM CHLORIDE 0.9 % IV SOLN: Freq: Once | INTRAVENOUS | Status: AC

## 2021-05-30 MED ORDER — PALONOSETRON HCL INJECTION 0.25 MG/5ML
0.2500 mg | Freq: Once | INTRAVENOUS | Status: AC
  Administered 2021-05-30: 0.25 mg via INTRAVENOUS
  Filled 2021-05-30: qty 5

## 2021-05-30 NOTE — Progress Notes (Signed)
[facility] at [address], [place] [address] [phone]   Interval Evaluation  Date of Service: 05/30/21 Patient Name: Christopher Tucker Patient MRN: [id number] Patient DOB: 08-26-1961 Provider: [name], MD  Identifying Statement:  Christopher Tucker is a 60 y.o. male with left temporal anaplastic astrocytoma   Oncologic History: Oncology History  Glioblastoma, IDH-wildtype ([place])  05/14/2020 Initial Diagnosis   Anaplastic astrocytoma, IDH-wildtype ([place])   05/21/2020 - 07/02/2020 Radiation Therapy   IMRT and concurrent Temodar with Dr. Lisbeth Renshaw   07/30/2020 - 08/07/2020 Chemotherapy    Patient is on Treatment Plan: BRAIN GBM DUKE IRINOTECAN + BEVACIZUMAB D1,15 Q 28D   Patient is on Antibody Plan: BRAIN GBM BEVACIZUMAB 14D X 6 CYCLES     10/18/2020 -  Chemotherapy    Patient is on Treatment Plan: BRAIN ANAPLASTIC GLIOMA GRADE III TEMOZOLOMIDE POST XRT Q28D   Patient is on Antibody Plan: BRAIN GBM BEVACIZUMAB 14D X 6 CYCLES     04/18/2021 -  Chemotherapy    Patient is on Treatment Plan: BRAIN GBM DUKE IRINOTECAN + BEVACIZUMAB D1,15 Q 28D   Patient is on Antibody Plan: BRAIN GBM BEVACIZUMAB 14D X 6 CYCLES        Biomarkers:  MGMT Unknown.  IDH 1/2 Wild type.  EGFR Amplified  TERT "Mutated   Interval History:  Christopher Tucker presents today for fourth infusion of Irinotecan+Avastin.  No issues with the last cycle, diarrhea has been better controlled.  He describes overall stability in general confusion and word finding difficulty, though wife does describe some modest decline in word-finding.  Gait remains independent, with assistance nearby if needed.  Denies seizures or headaches.  Depakote still at 5110m twice per day, no further seizures.    Decadron: 10/05/20: 811m1/28/22: 56m556m/24/22: 1mg30m04/22: -  H+P (05/10/20) Patient presented to medical attention in early August with several months history of sensory episodes.  They  are described as "numbness marching down right arm also involving the leg, lasting for several minutes".  There is no post event weakness or confusion.  No known history of seizures.  In recent weeks he has also experienced some difficulty with expressive language, finding particular words and maintaining high fluency.  Since starting Keppra post-operatively he has not had any recurrence of these sensory events.  He has weaned off dexamethasone.  We are still awaiting finalized pathology results.    Medications: Current Outpatient Medications on File Prior to Visit  Medication Sig Dispense Refill   amLODipine (NORVASC) 2.5 MG tablet Take 2.5 mg by mouth daily.     divalproex (DEPAKOTE) 500 MG DR tablet Take 1 tablet (500 mg total) by mouth 2 (two) times daily. 120 tablet 3   dronabinol (MARINOL) 2.5 MG capsule Take 1 capsule (2.5 mg total) by mouth 2 (two) times daily before a meal. (Patient not taking: No sig reported) 60 capsule 2   gabapentin (NEURONTIN) 300 MG capsule Take 1 capsule (300 mg total) by mouth 2 (two) times daily. (Patient not taking: No sig reported) 60 capsule 3   hydrocortisone (CORTEF) 10 MG tablet TAKE 1 TABLET BY MOUTH TWICE A DAY (Patient not taking: No sig reported) 60 tablet 1   ibuprofen (ADVIL) 200 MG tablet Take 400 mg by mouth 2 (two) times daily. (Patient not taking: No sig reported)     lisinopril (ZESTRIL) 20 MG tablet Take 2 tablets (40 mg total) by mouth daily. 120 tablet 3   loperamide (  IMODIUM A-D) 2 MG tablet Take 1 tablet (2 mg total) by mouth 4 (four) times daily as needed. Take 2 at diarrhea onset , then 1 every 2hr until 12hrs with no BM. May take 2 every 4hrs at night. If diarrhea recurs repeat. (Patient not taking: Reported on 05/16/2021) 100 tablet 1   methylphenidate (RITALIN) 5 MG tablet Take 1 tablet (5 mg total) by mouth 2 (two) times daily. (Patient not taking: No sig reported) 60 tablet 0   metoprolol tartrate 75 MG TABS Take 75 mg by mouth 2 (two)  times daily. 60 tablet 2   prochlorperazine (COMPAZINE) 10 MG tablet Take 1 tablet (10 mg total) by mouth every 6 (six) hours as needed for nausea or vomiting. 30 tablet 0   sertraline (ZOLOFT) 25 MG tablet TAKE 1 TABLET (25 MG TOTAL) BY MOUTH DAILY. 30 tablet 3   simvastatin (ZOCOR) 40 MG tablet Take 40 mg by mouth at bedtime. (Patient not taking: No sig reported)     No current facility-administered medications on file prior to visit.    Allergies:  Allergies  Allergen Reactions   Pravastatin     Other reaction(s): muscle aches   Past Medical History:  Past Medical History:  Diagnosis Date   High cholesterol    History of kidney stones    Past Surgical History:  Past Surgical History:  Procedure Laterality Date   APPLICATION OF CRANIAL NAVIGATION Left 04/25/2020   Procedure: APPLICATION OF CRANIAL NAVIGATION;  Surgeon: Consuella Lose, MD;  Location: [facility];  Service: Neurosurgery;  Laterality: Left;   CRANIOTOMY Left 04/25/2020   Procedure: STEREOTACTIC LEFT TEMPORAL CRANIOTOMY FOR TUMOR;  Surgeon: Consuella Lose, MD;  Location: [facility];  Service: Neurosurgery;  Laterality: Left;   HERNIA REPAIR Left    groin    Social History:  Social History   Socioeconomic History   Marital status: Married    Spouse name: Not on file   Number of children: Not on file   Years of education: Not on file   Highest education level: Not on file  Occupational History   Not on file  Tobacco Use   Smoking status: Never   Smokeless tobacco: Never  Substance and Sexual Activity   Alcohol use: Yes    Alcohol/week: 6.0 standard drinks    Types: 3 Glasses of wine, 3 Cans of beer per week   Drug use: Not Currently   Sexual activity: Not on file  Other Topics Concern   Not on file  Social History Narrative   Not on file   Social Determinants of Health   Financial Resource Strain: Not on file  Food Insecurity: Not on file  Transportation Needs: Not on file  Physical Activity: Not on  file  Stress: Not on file  Social Connections: Not on file  Intimate Partner Violence: Not on file   Family History: No family history on file.  Review of Systems: Constitutional: Doesn't report fevers, chills or abnormal weight loss Eyes: Doesn't report blurriness of vision Ears, nose, mouth, throat, and face: Doesn't report sore throat Respiratory: Doesn't report cough, dyspnea or wheezes Cardiovascular: Doesn't report palpitation, chest discomfort  Gastrointestinal:  Doesn't report nausea, constipation, diarrhea GU: Doesn't report incontinence Skin: torso rash Neurological: Per HPI Musculoskeletal: Doesn't report joint pain Behavioral/Psych: Doesn't report anxiety  Physical Exam: Vitals:   05/30/21 1322  BP: (!) 137/98  Pulse: 60  Resp: 18  Temp: (!) 96.8 F (36 C)  SpO2: 99%   KPS: 70.  General: Comfortable, NAD Head: Normal EENT: No conjunctival injection or scleral icterus.  Lungs: Resp effort normal Cardiac: Regular rate Abdomen: Non-distended abdomen Skin: normal Extremities: normal  Neurologic Exam: Mental Status: Awake, alert, attentive to examiner. Oriented to self and environment. Language c/w transcortical expressive dyshpasia Cranial Nerves: Visual acuity is grossly normal. Right field hemianopia. Extra-ocular movements intact. No ptosis. Face is symmetric Motor: Tone and bulk are normal. 4+/5 in right arm and leg. Reflexes are symmetric, no pathologic reflexes present.  Sensory: Intact to light touch Gait: Dystaxic, hemiparetic  Labs: I have reviewed the data as listed    Component Value Date/Time   NA 141 05/30/2021 1314   K 4.1 05/30/2021 1314   CL 108 05/30/2021 1314   CO2 22 05/30/2021 1314   GLUCOSE 110 (H) 05/30/2021 1314   BUN 15 05/30/2021 1314   CREATININE 0.86 05/30/2021 1314   CALCIUM 9.3 05/30/2021 1314   PROT 7.0 05/30/2021 1314   ALBUMIN 3.7 05/30/2021 1314   AST 15 05/30/2021 1314   ALT 18 05/30/2021 1314   ALKPHOS 47  05/30/2021 1314   BILITOT 0.7 05/30/2021 1314   GFRNONAA >60 05/30/2021 1314   GFRAA >60 06/14/2020 0828   Lab Results  Component Value Date   WBC 2.8 (L) 05/16/2021   NEUTROABS 1.2 (L) 05/16/2021   HGB 14.5 05/16/2021   HCT 42.2 05/16/2021   MCV 89.0 05/16/2021   PLT 163 05/16/2021     Assessment/Plan Glioblastoma, IDH-wildtype ([place]) [C71.9]  Rochester Serpe is clinically stable today.  No new or progressive deficits.  Neutrophils are again low today, but within safe range for administration of treatment plan.  We recommended proceeding with Irinotecan 155m/m2 IV q2 weeks, concurrent with Avastin 1726mkg IV q2 weeks.  He demonstrated dramatic cytopenias with higher dose Temodar, which gives usKoreaesitation regarding further alkylating chemotherapy.  Irinotecan is less likely to contribute to cytopenias; we reviewed management for diarrhea- dose 26m28mthen 2mg41m hours until improvement.     Chemotherapy should be held for the following:  ANC less than 1,000  Platelets less than 100,000  LFT or creatinine greater than 2x ULN  If clinical concerns/contraindications develop  Avastin should be held for the following:  ANC less than 1500  Platelets less than 50,000  LFT or creatinine greater than 2x ULN  If clinical concerns/contraindications develop  CARIS sequencing did not identify any actionable mutations.  Will con't metoprolol 75mg66m.  Ok to continue Depakote 500mg 56m no breathrough seizures.  We ask that Corie FMaysin Carstensn to in-person clinic in 2 weeks following MRI brain, scheduled for next Friday.  All questions were answered. The patient knows to call the clinic with any problems, questions or concerns. No barriers to learning were detected.  I have spent a total of 30 minutes of face-to-face and non-face-to-face time, excluding clinical staff time, preparing to see patient, ordering tests and/or medications, counseling the patient, and documenting clinical  information in the electronic or other health record    ZacharVentura Sellersedical Director of Neuro-Oncology [facility]/22 1:22 PM

## 2021-05-30 NOTE — Patient Instructions (Signed)
[facility]  Discharge Instructions: Thank you for choosing [facility] to provide your oncology and hematology care.   If you have a lab appointment with the [facility], please go directly to the [facility] and check in at the registration area.   Wear comfortable clothing and clothing appropriate for easy access to any Portacath or PICC line.   We strive to give you quality time with your provider. You may need to reschedule your appointment if you arrive late (15 or more minutes).  Arriving late affects you and other patients whose appointments are after yours.  Also, if you miss three or more appointments without notifying the office, you may be dismissed from the clinic at the provider's discretion.      For prescription refill requests, have your pharmacy contact our office and allow 72 hours for refills to be completed.    Today you received the following chemotherapy and/or immunotherapy agents: Bevacizumab and Irinotecan.    To help prevent nausea and vomiting after your treatment, we encourage you to take your nausea medication as directed.  BELOW ARE SYMPTOMS THAT SHOULD BE REPORTED IMMEDIATELY: *FEVER GREATER THAN 100.4 F (38 C) OR HIGHER *CHILLS OR SWEATING *NAUSEA AND VOMITING THAT IS NOT CONTROLLED WITH YOUR NAUSEA MEDICATION *UNUSUAL SHORTNESS OF BREATH *UNUSUAL BRUISING OR BLEEDING *URINARY PROBLEMS (pain or burning when urinating, or frequent urination) *BOWEL PROBLEMS (unusual diarrhea, constipation, pain near the anus) TENDERNESS IN MOUTH AND THROAT WITH OR WITHOUT PRESENCE OF ULCERS (sore throat, sores in mouth, or a toothache) UNUSUAL RASH, SWELLING OR PAIN  UNUSUAL VAGINAL DISCHARGE OR ITCHING   Items with * indicate a potential emergency and should be followed up as soon as possible or go to the Emergency Department if any problems should occur.  Please show the CHEMOTHERAPY ALERT CARD or IMMUNOTHERAPY ALERT CARD  at check-in to the Emergency Department and triage nurse.  Should you have questions after your visit or need to cancel or reschedule your appointment, please contact [facility]  Dept: [phone]  and follow the prompts.  Office hours are 8:00 a.m. to 4:30 p.m. Monday - Friday. Please note that voicemails left after 4:00 p.m. may not be returned until the following business day.  We are closed weekends and major holidays. You have access to a nurse at all times for urgent questions. Please call the main number to the clinic Dept: [phone] and follow the prompts.   For any non-urgent questions, you may also contact your provider using MyChart. We now offer e-Visits for anyone 79 and older to request care online for non-urgent symptoms. For details visit [email or web address].   Also download the MyChart app! Go to the app store, search "MyChart", open the app, select [facility], and log in with your MyChart username and password.  Due to Covid, a mask is required upon entering the hospital/clinic. If you do not have a mask, one will be given to you upon arrival. For doctor visits, patients may have 1 support person aged 72 or older with them. For treatment visits, patients cannot have anyone with them due to current Covid guidelines and our immunocompromised population.

## 2021-06-04 ENCOUNTER — Encounter (HOSPITAL_COMMUNITY): Payer: Self-pay

## 2021-06-04 ENCOUNTER — Other Ambulatory Visit: Payer: Self-pay | Admitting: Radiation Therapy

## 2021-06-07 ENCOUNTER — Other Ambulatory Visit: Payer: Self-pay

## 2021-06-07 ENCOUNTER — Ambulatory Visit (HOSPITAL_COMMUNITY)
Admission: RE | Admit: 2021-06-07 | Discharge: 2021-06-07 | Disposition: A | Discharge status: Home / Self Care | Payer: 59 | Source: Ambulatory Visit | Attending: Internal Medicine | Admitting: Internal Medicine

## 2021-06-07 DIAGNOSIS — C719 Malignant neoplasm of brain, unspecified: Secondary | ICD-10-CM | POA: Insufficient documentation

## 2021-06-07 IMAGING — MR MR HEAD WO/W CM
15 series · 48 of 48 positions shown · IV contrast (gadavist)
Comparison: MR head without and with contrast [DATE],
[DATE] and [DATE]

CLINICAL DATA: GBM.

EXAM:
MRI HEAD WITHOUT AND WITH CONTRAST
TECHNIQUE: Multiplanar, multiecho pulse sequences of the brain and surrounding
structures were obtained without and with intravenous contrast.
CONTRAST:  9mL GADAVIST GADOBUTROL 1 MMOL/ML IV SOLN

[Series 5: DWI · axial · 3.0mm · 1.36mm/px · z∈[-78,+72]mm · 7 of 104 slices shown (1 of 2)]
[im 1/104]
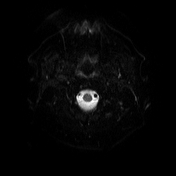
[im 18/104]
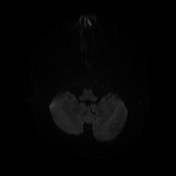
[im 35/104]
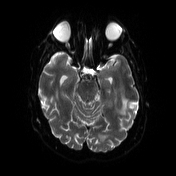
[im 52/104]
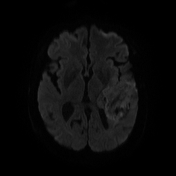
[im 69/104]
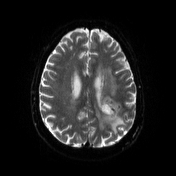
[im 86/104]
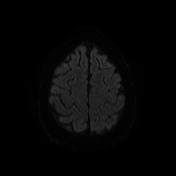
[im 104/104]
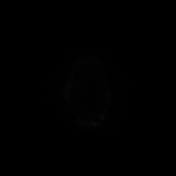

[Series 6: DWI · axial · 3.0mm · 1.36mm/px · z∈[-78,+72]mm · 3 of 52 slices shown (2 of 2)]
[im 1/52]
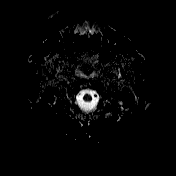
[im 26/52]
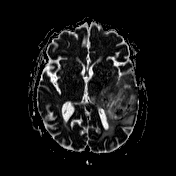
[im 52/52]
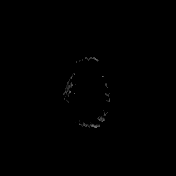

[Series 7: T1 · sagittal · 5.0mm · 0.75mm/px · 1 of 26 slices shown (1 of 4)]
[im 1/26]
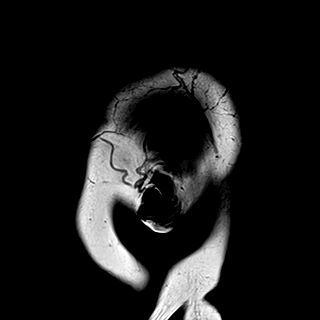

[Series 8: T2 · axial · 5.0mm · 0.62mm/px · 1 of 26 slices shown]
[im 1/26]
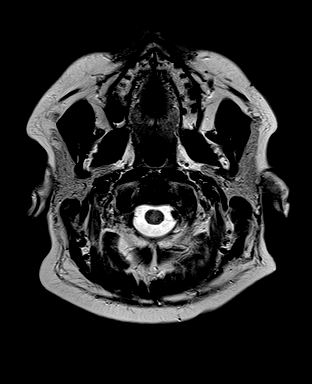

[Series 9: swi_images · axial · 3.0mm · 0.75mm/px · z∈[-82,+80]mm · 3 of 56 slices shown]
[im 1/56]
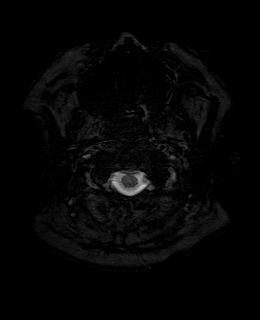
[im 28/56]
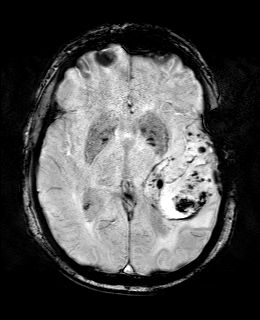
[im 56/56]
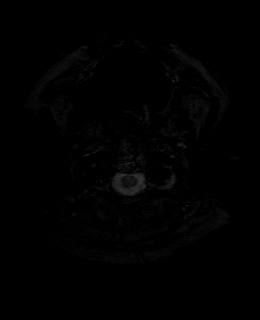

[Series 11: FLAIR · axial · 3.0mm · 0.75mm/px · z∈[-76,+74]mm · 3 of 52 slices shown]
[im 1/52]
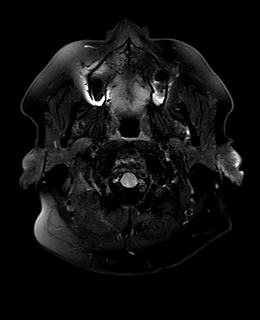
[im 26/52]
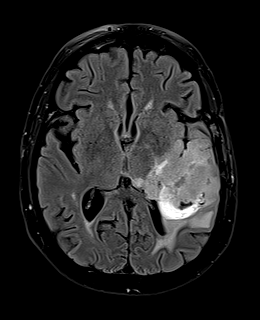
[im 52/52]
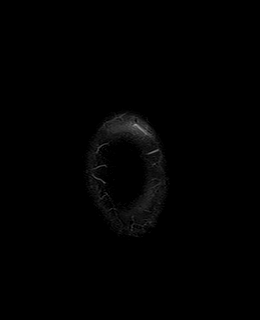

[Series 12: T1 · axial · 1.1mm · 0.94mm/px · z∈[-78,+77]mm · 8 of 144 slices shown (2 of 4)]
[im 1/144]
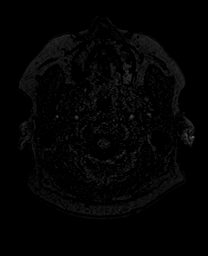
[im 21/144]
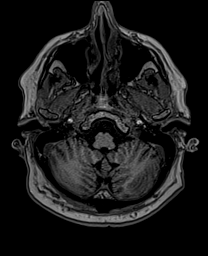
[im 41/144]
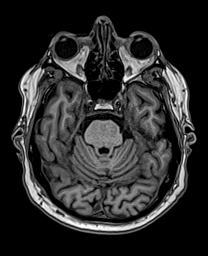
[im 62/144]
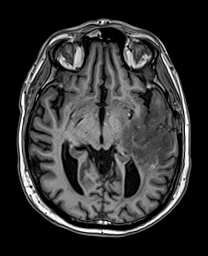
[im 82/144]
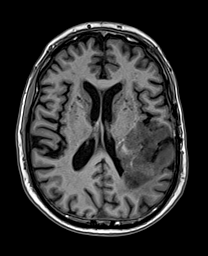
[im 103/144]
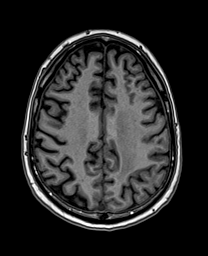
[im 123/144]
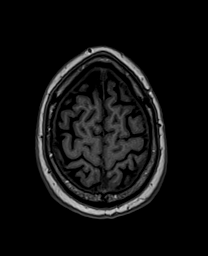
[im 144/144]
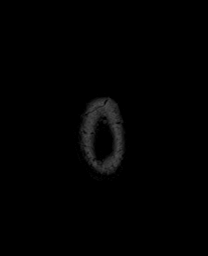

[Series 13: cor dwi_tracew · coronal · 5.0mm · 1.53mm/px · 3 of 56 slices shown]
[im 1/56]
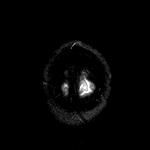
[im 28/56]
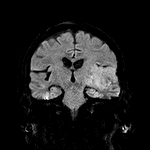
[im 56/56]
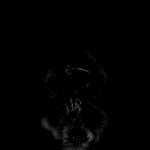

[Series 14: cor dwi_adc · coronal · 5.0mm · 1.53mm/px · 2 of 28 slices shown]
[im 1/28]
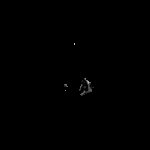
[im 28/28]
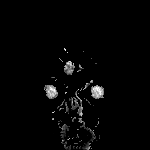

[Series 15: T2 post-contrast · coronal · 5.0mm · 0.57mm/px · 2 of 28 slices shown]
[im 1/28]
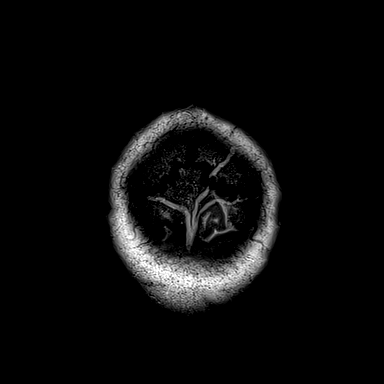
[im 28/28]
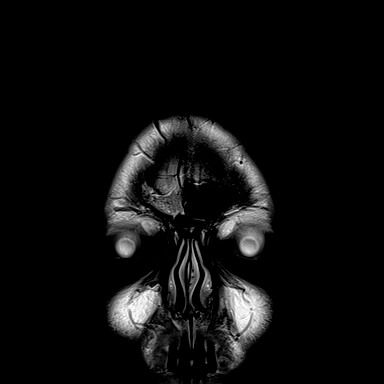

[Series 16: T1 post-contrast · axial · 1.1mm · 0.94mm/px · z∈[-78,+77]mm · 8 of 144 slices shown (1 of 3)]
[im 1/144]
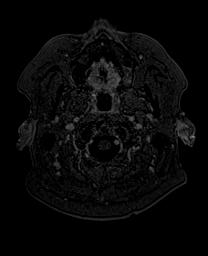
[im 21/144]
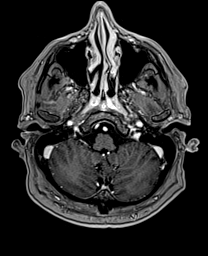
[im 41/144]
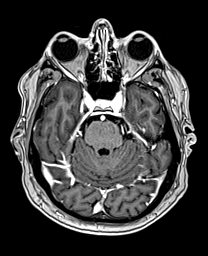
[im 62/144]
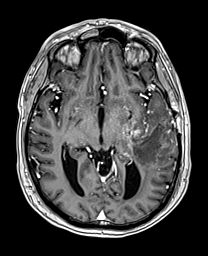
[im 82/144]
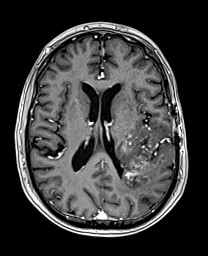
[im 103/144]
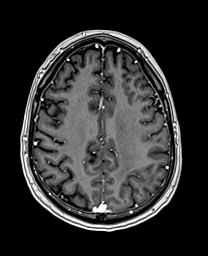
[im 123/144]
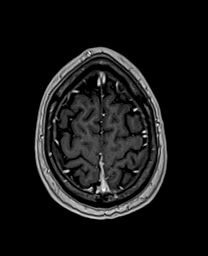
[im 144/144]
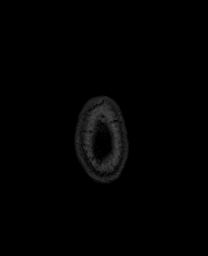

[Series 17: T1 · sagittal · 4.0mm · 0.94mm/px · 2 of 36 slices shown (3 of 4)]
[im 1/36]
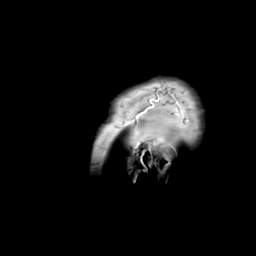
[im 36/36]
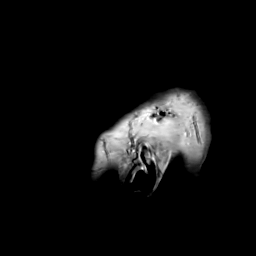

[Series 18: T1 · coronal · 4.0mm · 0.94mm/px · 2 of 30 slices shown (4 of 4)]
[im 1/30]
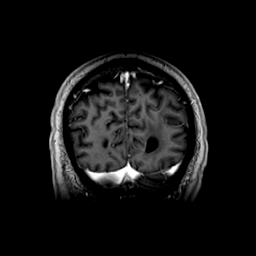
[im 30/30]
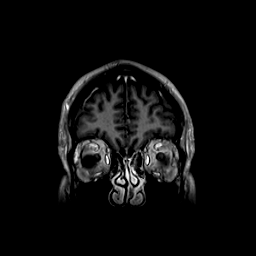

[Series 19: T1 post-contrast · coronal · 5.0mm · 0.43mm/px · 2 of 28 slices shown (2 of 3)]
[im 1/28]
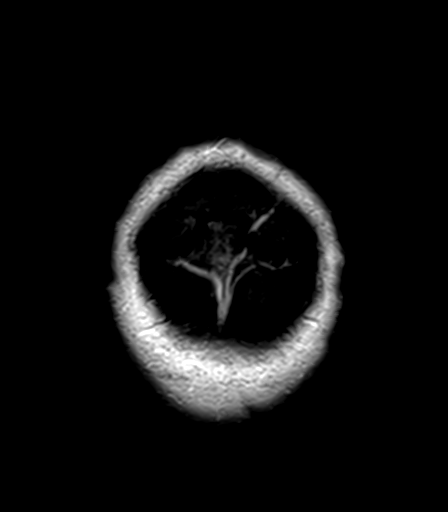
[im 28/28]
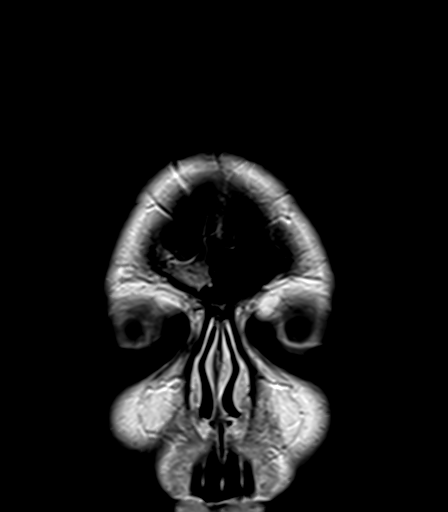

[Series 20: T1 post-contrast · sagittal · 5.0mm · 0.75mm/px · 1 of 26 slices shown (3 of 3)]
[im 1/26]
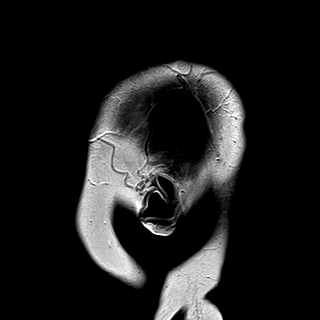

[48 of 48 positions shown; findings below may reference images not displayed]

FINDINGS: Brain: Tumor progression noted with increased enhancement of the
posterior and superior aspect of the surgical cavity. Overall size
of enhancing tissue is increased on the sagittal view, now measuring
6.4 x 3.9 cm. Increased T2 signal is present with subcortical
extension in the left parietal lobe on image 35 of series 11. T2
FLAIR signal changes are otherwise similar the prior exams. No
significant right-sided enhancement is present. There is mass effect
with partial effacement of the atrium of the right lateral
ventricle. Minimal midline shift is stable.

New areas of restricted diffusion are present. Progressive blood
products noted.

White matter changes extend into the brainstem on the left.
Brainstem and cerebellum are otherwise within normal limits.

Vascular: Flow is present in the major intracranial arteries.

Skull and upper cervical spine: The craniocervical junction is
normal. Upper cervical spine is within normal limits. Marrow signal
is unremarkable.

Sinuses/Orbits: The paranasal sinuses and mastoid air cells are
clear. The globes and orbits are within normal limits.
IMPRESSION: 1. Increasing size of enhancing tissue along the posterior and
superior aspect of the surgical cavity concerning for tumor
progression.
2. Increased T2 FLAIR signal changes with subcortical extension in
the left parietal lobe also suggests tumor progression.
3. New areas of restricted diffusion and internal hemorrhage.

## 2021-06-07 MED ORDER — GADOBUTROL 1 MMOL/ML IV SOLN
9.0000 mL | Freq: Once | INTRAVENOUS | Status: AC | PRN
  Administered 2021-06-07: 9 mL via INTRAVENOUS

## 2021-06-10 ENCOUNTER — Encounter: Payer: Self-pay | Admitting: Internal Medicine

## 2021-06-11 ENCOUNTER — Telehealth: Payer: Self-pay | Admitting: Internal Medicine

## 2021-06-11 NOTE — Telephone Encounter (Signed)
Sch per 9/9 los, pt aware.

## 2021-06-13 ENCOUNTER — Inpatient Hospital Stay (HOSPITAL_BASED_OUTPATIENT_CLINIC_OR_DEPARTMENT_OTHER): Payer: 59 | Admitting: Internal Medicine

## 2021-06-13 ENCOUNTER — Telehealth: Payer: Self-pay

## 2021-06-13 ENCOUNTER — Other Ambulatory Visit (HOSPITAL_COMMUNITY): Payer: Self-pay

## 2021-06-13 ENCOUNTER — Other Ambulatory Visit: Payer: Self-pay

## 2021-06-13 ENCOUNTER — Inpatient Hospital Stay: Payer: 59

## 2021-06-13 VITALS — BP 155/108 | HR 51 | Temp 97.0°F | Resp 17 | Ht 71.0 in | Wt 197.5 lb

## 2021-06-13 VITALS — BP 151/100 | HR 55

## 2021-06-13 DIAGNOSIS — C719 Malignant neoplasm of brain, unspecified: Secondary | ICD-10-CM

## 2021-06-13 DIAGNOSIS — Z5112 Encounter for antineoplastic immunotherapy: Secondary | ICD-10-CM | POA: Diagnosis not present

## 2021-06-13 DIAGNOSIS — R569 Unspecified convulsions: Secondary | ICD-10-CM

## 2021-06-13 LAB — CMP (CANCER CENTER ONLY)
ALT: 16 U/L (ref 0–44)
AST: 16 U/L (ref 15–41)
Albumin: 3.7 g/dL (ref 3.5–5.0)
Alkaline Phosphatase: 43 U/L (ref 38–126)
Anion gap: 8 (ref 5–15)
BUN: 11 mg/dL (ref 6–20)
CO2: 23 mmol/L (ref 22–32)
Calcium: 9.2 mg/dL (ref 8.9–10.3)
Chloride: 108 mmol/L (ref 98–111)
Creatinine: 0.78 mg/dL (ref 0.61–1.24)
GFR, Estimated: >60 mL/min (ref >60)
Glucose, Bld: 88 mg/dL (ref 70–99)
Potassium: 4.2 mmol/L (ref 3.5–5.1)
Sodium: 139 mmol/L (ref 135–145)
Total Bilirubin: 0.7 mg/dL (ref 0.3–1.2)
Total Protein: 6.8 g/dL (ref 6.5–8.1)

## 2021-06-13 LAB — CBC WITH DIFFERENTIAL (CANCER CENTER ONLY)
Abs Immature Granulocytes: 0.05 10*3/uL (ref 0.00–0.07)
Basophils Absolute: 0 10*3/uL (ref 0.0–0.1)
Basophils Relative: 1 %
Eosinophils Absolute: 0.1 10*3/uL (ref 0.0–0.5)
Eosinophils Relative: 2 %
HCT: 40.1 % (ref 39.0–52.0)
Hemoglobin: 13.5 g/dL (ref 13.0–17.0)
Immature Granulocytes: 2 %
Lymphocytes Relative: 33 %
Lymphs Abs: 1.1 10*3/uL (ref 0.7–4.0)
MCH: 31 pg (ref 26.0–34.0)
MCHC: 33.7 g/dL (ref 30.0–36.0)
MCV: 92.2 fL (ref 80.0–100.0)
Monocytes Absolute: 0.7 10*3/uL (ref 0.1–1.0)
Monocytes Relative: 20 %
Neutro Abs: 1.4 10*3/uL — ABNORMAL LOW (ref 1.7–7.7)
Neutrophils Relative %: 42 %
Platelet Count: 161 10*3/uL (ref 150–400)
RBC: 4.35 MIL/uL (ref 4.22–5.81)
RDW: 15.8 % — ABNORMAL HIGH (ref 11.5–15.5)
WBC Count: 3.3 10*3/uL — ABNORMAL LOW (ref 4.0–10.5)
nRBC: 0 % (ref 0.0–0.2)

## 2021-06-13 LAB — TOTAL PROTEIN, URINE DIPSTICK: Protein, ur: NEGATIVE mg/dL

## 2021-06-13 MED ORDER — SODIUM CHLORIDE 0.9 % IV SOLN
10.0000 mg/kg | Freq: Once | INTRAVENOUS | Status: AC
  Administered 2021-06-13: 900 mg via INTRAVENOUS
  Filled 2021-06-13: qty 32

## 2021-06-13 MED ORDER — TEMOZOLOMIDE 140 MG PO CAPS
140.0000 mg | ORAL_CAPSULE | Freq: Every day | ORAL | 0 refills | Status: DC
  Filled 2021-06-13: qty 14, 14d supply, fill #0

## 2021-06-13 MED ORDER — AMLODIPINE BESYLATE 5 MG PO TABS: 5.0000 mg | ORAL_TABLET | Freq: Every day | ORAL | 3 refills | Status: AC

## 2021-06-13 MED ORDER — TEMOZOLOMIDE 180 MG PO CAPS
180.0000 mg | ORAL_CAPSULE | Freq: Every day | ORAL | 0 refills | Status: DC
  Filled 2021-06-13: qty 5, 5d supply, fill #0

## 2021-06-13 MED ORDER — TEMOZOLOMIDE 140 MG PO CAPS: 140.0000 mg | ORAL_CAPSULE | Freq: Every day | ORAL | 0 refills | Status: DC

## 2021-06-13 MED ORDER — SODIUM CHLORIDE 0.9 % IV SOLN: Freq: Once | INTRAVENOUS | Status: AC

## 2021-06-13 MED ORDER — ONDANSETRON HCL 8 MG PO TABS
8.0000 mg | ORAL_TABLET | Freq: Two times a day (BID) | ORAL | 1 refills | Status: AC | PRN
  Filled 2021-06-13: qty 30, 15d supply, fill #0

## 2021-06-13 MED ORDER — TEMOZOLOMIDE 180 MG PO CAPS: 180.0000 mg | ORAL_CAPSULE | Freq: Every day | ORAL | 0 refills | Status: DC

## 2021-06-13 NOTE — Progress Notes (Signed)
[place] [place], [place] [address] [phone]   Interval Evaluation  Date of Service: 06/13/21 Patient Name: Christopher Tucker Patient MRN: [id number] Patient DOB: [birth year] Provider: [name], MD  Identifying Statement:  Christopher Tucker is a 60 y.o. male with left temporal anaplastic astrocytoma   Oncologic History: Oncology History  Glioblastoma, IDH-wildtype ([place])  05/14/2020 Initial Diagnosis   Anaplastic astrocytoma, IDH-wildtype ([place])   05/21/2020 - 07/02/2020 Radiation Therapy   IMRT and concurrent Temodar with Dr. Lisbeth Tucker   07/30/2020 - 08/07/2020 Chemotherapy    Patient is on Treatment Plan: BRAIN GBM DUKE IRINOTECAN + BEVACIZUMAB D1,15 Q 28D   Patient is on Antibody Plan: BRAIN GBM BEVACIZUMAB 14D X 6 CYCLES     10/18/2020 -  Chemotherapy    Patient is on Treatment Plan: BRAIN GBM DUKE IRINOTECAN + BEVACIZUMAB D1,15 Q 28D   Patient is on Antibody Plan: BRAIN GBM BEVACIZUMAB 14D X 6 CYCLES     04/18/2021 - 05/30/2021 Chemotherapy      Patient is on Antibody Plan: BRAIN GBM BEVACIZUMAB 14D X 6 CYCLES        Biomarkers:  MGMT Unknown.  IDH 1/2 Wild type.  EGFR Amplified  TERT "Mutated   Interval History:  Christopher Tucker presents today following recent MRI brain.  No issues with the last cycle, diarrhea has been better controlled.  No significant changes in general confusion and word finding difficulty.  Gait remains independent, with assistance nearby if needed.  Denies seizures or headaches.  Depakote still at 560m twice per day, no further seizures.    Decadron: 10/05/20: 866m1/28/22: 100m35m/24/22: 1mg62m04/22: -  H+P (05/10/20) Patient presented to medical attention in early August with several months history of sensory episodes.  They are described as "numbness marching down right arm also involving the leg, lasting for several minutes".  There is no post event weakness or confusion.  No known history  of seizures.  In recent weeks he has also experienced some difficulty with expressive language, finding particular words and maintaining high fluency.  Since starting Keppra post-operatively he has not had any recurrence of these sensory events.  He has weaned off dexamethasone.  We are still awaiting finalized pathology results.    Medications: Current Outpatient Medications on File Prior to Visit  Medication Sig Dispense Refill   amLODipine (NORVASC) 2.5 MG tablet Take 2.5 mg by mouth daily.     divalproex (DEPAKOTE) 500 MG DR tablet Take 1 tablet (500 mg total) by mouth 2 (two) times daily. 120 tablet 3   metoprolol tartrate 75 MG TABS Take 75 mg by mouth 2 (two) times daily. 60 tablet 2   prochlorperazine (COMPAZINE) 10 MG tablet Take 1 tablet (10 mg total) by mouth every 6 (six) hours as needed for nausea or vomiting. 30 tablet 0   sertraline (ZOLOFT) 25 MG tablet TAKE 1 TABLET (25 MG TOTAL) BY MOUTH DAILY. 30 tablet 3   dronabinol (MARINOL) 2.5 MG capsule Take 1 capsule (2.5 mg total) by mouth 2 (two) times daily before a meal. (Patient not taking: No sig reported) 60 capsule 2   gabapentin (NEURONTIN) 300 MG capsule Take 1 capsule (300 mg total) by mouth 2 (two) times daily. (Patient not taking: No sig reported) 60 capsule 3   hydrocortisone (CORTEF) 10 MG tablet TAKE 1 TABLET BY MOUTH TWICE A DAY (Patient not taking: No sig reported) 60 tablet 1   ibuprofen (ADVIL) 200  MG tablet Take 400 mg by mouth 2 (two) times daily. (Patient not taking: No sig reported)     methylphenidate (RITALIN) 5 MG tablet Take 1 tablet (5 mg total) by mouth 2 (two) times daily. (Patient not taking: No sig reported) 60 tablet 0   simvastatin (ZOCOR) 40 MG tablet Take 40 mg by mouth at bedtime. (Patient not taking: No sig reported)     No current facility-administered medications on file prior to visit.    Allergies:  Allergies  Allergen Reactions   Pravastatin     Other reaction(s): muscle aches   Past  Medical History:  Past Medical History:  Diagnosis Date   High cholesterol    History of kidney stones    Past Surgical History:  Past Surgical History:  Procedure Laterality Date   APPLICATION OF CRANIAL NAVIGATION Left 04/25/2020   Procedure: APPLICATION OF CRANIAL NAVIGATION;  Surgeon: Christopher Lose, MD;  Location: [facility];  Service: Neurosurgery;  Laterality: Left;   CRANIOTOMY Left 04/25/2020   Procedure: STEREOTACTIC LEFT TEMPORAL CRANIOTOMY FOR TUMOR;  Surgeon: Christopher Lose, MD;  Location: [facility];  Service: Neurosurgery;  Laterality: Left;   HERNIA REPAIR Left    groin    Social History:  Social History   Socioeconomic History   Marital status: Married    Spouse name: Not on file   Number of children: Not on file   Years of education: Not on file   Highest education level: Not on file  Occupational History   Not on file  Tobacco Use   Smoking status: Never   Smokeless tobacco: Never  Substance and Sexual Activity   Alcohol use: Yes    Alcohol/week: 6.0 standard drinks    Types: 3 Glasses of wine, 3 Cans of beer per week   Drug use: Not Currently   Sexual activity: Not on file  Other Topics Concern   Not on file  Social History Narrative   Not on file   Social Determinants of Health   Financial Resource Strain: Not on file  Food Insecurity: Not on file  Transportation Needs: Not on file  Physical Activity: Not on file  Stress: Not on file  Social Connections: Not on file  Intimate Partner Violence: Not on file   Family History: No family history on file.  Review of Systems: Constitutional: Doesn't report fevers, chills or abnormal weight loss Eyes: Doesn't report blurriness of vision Ears, nose, mouth, throat, and face: Doesn't report sore throat Respiratory: Doesn't report cough, dyspnea or wheezes Cardiovascular: Doesn't report palpitation, chest discomfort  Gastrointestinal:  Doesn't report nausea, constipation, diarrhea GU: Doesn't report  incontinence Skin: torso rash Neurological: Per HPI Musculoskeletal: Doesn't report joint pain Behavioral/Psych: Doesn't report anxiety  Physical Exam: Vitals:   06/13/21 1111  BP: (!) 155/108  Pulse: (!) 51  Resp: 17  Temp: (!) 97 F (36.1 C)  SpO2: 100%   KPS: 70. General: Comfortable, NAD Head: Normal EENT: No conjunctival injection or scleral icterus.  Lungs: Resp effort normal Cardiac: Regular rate Abdomen: Non-distended abdomen Skin: normal Extremities: normal  Neurologic Exam: Mental Status: Awake, alert, attentive to examiner. Oriented to self and environment. Language c/w transcortical expressive dyshpasia Cranial Nerves: Visual acuity is grossly normal. Right field hemianopia. Extra-ocular movements intact. No ptosis. Face is symmetric Motor: Tone and bulk are normal. 4+/5 in right arm and leg. Reflexes are symmetric, no pathologic reflexes present.  Sensory: Intact to light touch Gait: Dystaxic, hemiparetic  Labs: I have reviewed the data  as listed    Component Value Date/Time   NA 139 06/13/2021 1053   K 4.2 06/13/2021 1053   CL 108 06/13/2021 1053   CO2 23 06/13/2021 1053   GLUCOSE 88 06/13/2021 1053   BUN 11 06/13/2021 1053   CREATININE 0.78 06/13/2021 1053   CALCIUM 9.2 06/13/2021 1053   PROT 6.8 06/13/2021 1053   ALBUMIN 3.7 06/13/2021 1053   AST 16 06/13/2021 1053   ALT 16 06/13/2021 1053   ALKPHOS 43 06/13/2021 1053   BILITOT 0.7 06/13/2021 1053   GFRNONAA >60 06/13/2021 1053   GFRAA >60 06/14/2020 0828   Lab Results  Component Value Date   WBC 3.3 (L) 06/13/2021   NEUTROABS 1.4 (L) 06/13/2021   HGB 13.5 06/13/2021   HCT 40.1 06/13/2021   MCV 92.2 06/13/2021   PLT 161 06/13/2021   Imaging:  [place] Clinician Interpretation: I have personally reviewed the CNS images as listed.  My interpretation, in the context of the patient's clinical presentation, is progressive disease  MR BRAIN W WO CONTRAST  Result Date: 06/07/2021 CLINICAL  DATA:  GBM. EXAM: MRI HEAD WITHOUT AND WITH CONTRAST TECHNIQUE: Multiplanar, multiecho pulse sequences of the brain and surrounding structures were obtained without and with intravenous contrast. CONTRAST:  82m GADAVIST GADOBUTROL 1 MMOL/ML IV SOLN COMPARISON:  MR head without and with contrast 04/12/2021, 02/01/2021 and 12/06/2020 FINDINGS: Brain: Tumor progression noted with increased enhancement of the posterior and superior aspect of the surgical cavity. Overall size of enhancing tissue is increased on the sagittal view, now measuring 6.4 x 3.9 cm. Increased T2 signal is present with subcortical extension in the left parietal lobe on image 35 of series 11. T2 FLAIR signal changes are otherwise similar the prior exams. No significant right-sided enhancement is present. There is mass effect with partial effacement of the atrium of the right lateral ventricle. Minimal midline shift is stable. New areas of restricted diffusion are present. Progressive blood products noted. White matter changes extend into the brainstem on the left. Brainstem and cerebellum are otherwise within normal limits. Vascular: Flow is present in the major intracranial arteries. Skull and upper cervical spine: The craniocervical junction is normal. Upper cervical spine is within normal limits. Marrow signal is unremarkable. Sinuses/Orbits: The paranasal sinuses and mastoid air cells are clear. The globes and orbits are within normal limits. IMPRESSION: 1. Increasing size of enhancing tissue along the posterior and superior aspect of the surgical cavity concerning for tumor progression. 2. Increased T2 FLAIR signal changes with subcortical extension in the left parietal lobe also suggests tumor progression. 3. New areas of restricted diffusion and internal hemorrhage. Electronically Signed   By: CSan MorelleM.D.   On: 06/07/2021 15:48    Assessment/Plan Glioblastoma, IDH-wildtype ([place] [C71.9]  MErrol [place] clinically stable  today.  Unfortunately, MRI does present changes which are likely consistent with progression of disease.  Degree of change in cross-sectional area is less than 25%, but still with features most consistent with progression.  We recommended discontinuing the Irinotecan component of his treatment plan.  We recommended re-initiating treatment with Temozolomide 1571mm2, on for five days and off for twenty three days in twenty eight day cycles. The patient will have a complete blood count performed on days 21 and 28 of each cycle, and a comprehensive metabolic panel performed on day 28 of each cycle. Labs may need to be performed more often. Zofran will prescribed for home use for nausea/vomiting.   Informed consent was obtained verbally at  bedside to proceed with oral chemotherapy.  Neulasta subq will be provided for bone marrow support given severe thrombocytopenia with prior high dose Temodar dosing; dosed >24h following final Temodar dose.  Chemotherapy should be held for the following:  ANC less than 1,000  Platelets less than 100,000  LFT or creatinine greater than 2x ULN  If clinical concerns/contraindications develop   Avastin should be held for the following:  ANC less than 1500  Platelets less than 50,000  LFT or creatinine greater than 2x ULN  If clinical concerns/contraindications develop  CARIS sequencing did not identify any actionable mutations.  Will con't metoprolol 55m BID, amlodipine will be increased to 555mdaily given BP today.  Ok to continue Depakote 50036mID, no breathrough seizures.  We ask that MarNathanial Arrighiturn to in-person clinic in 2 weeks for avastin and neulasta.  All questions were answered. The patient knows to call the clinic with any problems, questions or concerns. No barriers to learning were detected.  I have spent a total of 40 minutes of face-to-face and non-face-to-face time, excluding clinical staff time, preparing to see patient, ordering  tests and/or medications, counseling the patient, and documenting clinical information in the electronic or other health record    ZacVentura SellersD Medical Director of Neuro-Oncology [facility] [place]/22/22 11:50 AM

## 2021-06-13 NOTE — Patient Instructions (Signed)
[facility] MEDICAL [facility]COL[facility]  Discharge Instructions: °Thank you for choosing [facility] to provide your oncology and hematology care.  ° °If you have a lab appointment with the Cancer Center, please go directly to the Cancer Center and check in at the registration area. °  °Wear comfortable clothing and clothing appropriate for easy access to any Portacath or PICC line.  ° °We strive to give you quality time with your provider. You may need to reschedule your appointment if you arrive late (15 or more minutes).  Arriving late affects you and other patients whose appointments are after yours.  Also, if you miss three or more appointments without notifying the office, you may be dismissed from the clinic at the provider’s discretion.    °  °For prescription refill requests, have your pharmacy contact our office and allow 72 hours for refills to be completed.   ° °Today you received the following chemotherapy and/or immunotherapy agents: Bevacizumab.     °  °To help prevent nausea and vomiting after your treatment, we encourage you to take your nausea medication as directed. ° °BELOW ARE SYMPTOMS THAT SHOULD BE REPORTED IMMEDIATELY: °*FEVER GREATER THAN 100.4 F (38 °C) OR HIGHER °*CHILLS OR SWEATING °*NAUSEA AND VOMITING THAT IS NOT CONTROLLED WITH YOUR NAUSEA MEDICATION °*UNUSUAL SHORTNESS OF BREATH °*UNUSUAL BRUISING OR BLEEDING °*URINARY PROBLEMS (pain or burning when urinating, or frequent urination) °*BOWEL PROBLEMS (unusual diarrhea, constipation, pain near the anus) °TENDERNESS IN MOUTH AND THROAT WITH OR WITHOUT PRESENCE OF ULCERS (sore throat, sores in mouth, or a toothache) °UNUSUAL RASH, SWELLING OR PAIN  °UNUSUAL VAGINAL DISCHARGE OR ITCHING  ° °Items with * indicate a potential emergency and should be followed up as soon as possible or go to the Emergency Department if any problems should occur. ° °Please show the CHEMOTHERAPY ALERT CARD or IMMUNOTHERAPY ALERT CARD at check-in  to the Emergency Department and triage nurse. ° °Should you have questions after your visit or need to cancel or reschedule your appointment, please contact [facility]  Dept: [phone]  and follow the prompts.  Office hours are 8:00 a.m. to 4:30 p.m. Monday - Friday. Please note that voicemails left after 4:00 p.m. may not be returned until the following business day.  We are closed weekends and major holidays. You have access to a nurse at all times for urgent questions. Please call the main number to the clinic Dept: [phone] and follow the prompts. ° ° °For any non-urgent questions, you may also contact your provider using MyChart. We now offer e-Visits for anyone 18 and older to request care online for non-urgent symptoms. For details visit [email or web address]. °  °Also download the MyChart app! Go to the app store, search "MyChart", open the app, select [facility], and log in with your MyChart username and password. ° °Due to Covid, a mask is required upon entering the hospital/clinic. If you do not have a mask, one will be given to you upon arrival. For doctor visits, patients may have 1 support person aged 18 or older with them. For treatment visits, patients cannot have anyone with them due to current Covid guidelines and our immunocompromised population.  ° °

## 2021-06-13 NOTE — Progress Notes (Signed)
Ok to treat with ANC = 1.4 today per MD.  Acquanetta Belling, RPH, BCPS, BCOP 06/13/2021 12:49 PM

## 2021-06-17 NOTE — Telephone Encounter (Signed)
Oral Oncology Pharmacist Encounter  Prescription refill for Temodar sent to [facility] in error. Patient enrolled in manufacturer assistance and receives medication through [facility]. [facility]cription redirected to [facility].  Drema Halon, PharmD Hematology/Oncology Clinical Pharmacist [facility] [phone]

## 2021-06-22 ENCOUNTER — Other Ambulatory Visit: Payer: Self-pay | Admitting: Internal Medicine

## 2021-06-24 ENCOUNTER — Encounter: Payer: Self-pay | Admitting: Internal Medicine

## 2021-06-27 ENCOUNTER — Other Ambulatory Visit: Payer: Self-pay

## 2021-06-27 ENCOUNTER — Inpatient Hospital Stay: Payer: 59 | Attending: Neurosurgery

## 2021-06-27 ENCOUNTER — Inpatient Hospital Stay: Payer: 59

## 2021-06-27 ENCOUNTER — Inpatient Hospital Stay (HOSPITAL_BASED_OUTPATIENT_CLINIC_OR_DEPARTMENT_OTHER): Payer: 59 | Admitting: Internal Medicine

## 2021-06-27 VITALS — BP 135/95 | HR 62 | Temp 97.8°F | Resp 17 | Ht 71.0 in | Wt 198.6 lb

## 2021-06-27 DIAGNOSIS — R569 Unspecified convulsions: Secondary | ICD-10-CM

## 2021-06-27 DIAGNOSIS — Z79899 Other long term (current) drug therapy: Secondary | ICD-10-CM | POA: Insufficient documentation

## 2021-06-27 DIAGNOSIS — Z87442 Personal history of urinary calculi: Secondary | ICD-10-CM | POA: Diagnosis not present

## 2021-06-27 DIAGNOSIS — C719 Malignant neoplasm of brain, unspecified: Secondary | ICD-10-CM

## 2021-06-27 DIAGNOSIS — Z23 Encounter for immunization: Secondary | ICD-10-CM | POA: Insufficient documentation

## 2021-06-27 DIAGNOSIS — Z923 Personal history of irradiation: Secondary | ICD-10-CM | POA: Diagnosis not present

## 2021-06-27 DIAGNOSIS — I10 Essential (primary) hypertension: Secondary | ICD-10-CM | POA: Insufficient documentation

## 2021-06-27 DIAGNOSIS — Z5112 Encounter for antineoplastic immunotherapy: Secondary | ICD-10-CM | POA: Diagnosis not present

## 2021-06-27 DIAGNOSIS — Z7952 Long term (current) use of systemic steroids: Secondary | ICD-10-CM | POA: Diagnosis not present

## 2021-06-27 DIAGNOSIS — F1020 Alcohol dependence, uncomplicated: Secondary | ICD-10-CM | POA: Diagnosis not present

## 2021-06-27 LAB — CBC WITH DIFFERENTIAL (CANCER CENTER ONLY)
Abs Immature Granulocytes: 0.09 10*3/uL — ABNORMAL HIGH (ref 0.00–0.07)
Basophils Absolute: 0.1 10*3/uL (ref 0.0–0.1)
Basophils Relative: 1 %
Eosinophils Absolute: 0.1 10*3/uL (ref 0.0–0.5)
Eosinophils Relative: 1 %
HCT: 42.9 % (ref 39.0–52.0)
Hemoglobin: 14.7 g/dL (ref 13.0–17.0)
Immature Granulocytes: 2 %
Lymphocytes Relative: 20 %
Lymphs Abs: 1.1 10*3/uL (ref 0.7–4.0)
MCH: 31.4 pg (ref 26.0–34.0)
MCHC: 34.3 g/dL (ref 30.0–36.0)
MCV: 91.7 fL (ref 80.0–100.0)
Monocytes Absolute: 1.5 10*3/uL — ABNORMAL HIGH (ref 0.1–1.0)
Monocytes Relative: 26 %
Neutro Abs: 2.9 10*3/uL (ref 1.7–7.7)
Neutrophils Relative %: 50 %
Platelet Count: 170 10*3/uL (ref 150–400)
RBC: 4.68 MIL/uL (ref 4.22–5.81)
RDW: 15.3 % (ref 11.5–15.5)
WBC Count: 5.7 10*3/uL (ref 4.0–10.5)
nRBC: 0 % (ref 0.0–0.2)

## 2021-06-27 LAB — CMP (CANCER CENTER ONLY)
ALT: 22 U/L (ref 0–44)
AST: 20 U/L (ref 15–41)
Albumin: 3.8 g/dL (ref 3.5–5.0)
Alkaline Phosphatase: 43 U/L (ref 38–126)
Anion gap: 9 (ref 5–15)
BUN: 17 mg/dL (ref 6–20)
CO2: 23 mmol/L (ref 22–32)
Calcium: 9.2 mg/dL (ref 8.9–10.3)
Chloride: 103 mmol/L (ref 98–111)
Creatinine: 0.9 mg/dL (ref 0.61–1.24)
GFR, Estimated: >60 mL/min (ref >60)
Glucose, Bld: 90 mg/dL (ref 70–99)
Potassium: 4.6 mmol/L (ref 3.5–5.1)
Sodium: 135 mmol/L (ref 135–145)
Total Bilirubin: 1.1 mg/dL (ref 0.3–1.2)
Total Protein: 7.1 g/dL (ref 6.5–8.1)

## 2021-06-27 LAB — TOTAL PROTEIN, URINE DIPSTICK: Protein, ur: <30 mg/dL

## 2021-06-27 MED ORDER — SODIUM CHLORIDE 0.9 % IV SOLN
10.0000 mg/kg | Freq: Once | INTRAVENOUS | Status: AC
  Administered 2021-06-27: 900 mg via INTRAVENOUS
  Filled 2021-06-27: qty 32

## 2021-06-27 MED ORDER — TBO-FILGRASTIM 480 MCG/0.8ML ~~LOC~~ SOSY
480.0000 ug | PREFILLED_SYRINGE | Freq: Once | SUBCUTANEOUS | Status: DC
  Filled 2021-06-27: qty 0.8

## 2021-06-27 MED ORDER — FILGRASTIM-SNDZ 480 MCG/0.8ML IJ SOSY
480.0000 ug | PREFILLED_SYRINGE | Freq: Once | INTRAMUSCULAR | Status: AC
  Administered 2021-06-27: 480 ug via SUBCUTANEOUS
  Filled 2021-06-27: qty 0.8

## 2021-06-27 MED ORDER — INFLUENZA VAC SPLIT QUAD 0.5 ML IM SUSY
0.5000 mL | PREFILLED_SYRINGE | Freq: Once | INTRAMUSCULAR | Status: AC
  Administered 2021-06-27: 0.5 mL via INTRAMUSCULAR
  Filled 2021-06-27: qty 0.5

## 2021-06-27 MED ORDER — SODIUM CHLORIDE 0.9 % IV SOLN: Freq: Once | INTRAVENOUS | Status: AC

## 2021-06-27 NOTE — Patient Instructions (Addendum)
[facility]  Discharge Instructions:  Take Tylenol or Claritin for bone pain related to Zarxio Thank you for choosing [facility] to provide your oncology and hematology care.   If you have a lab appointment with the [facility], please go directly to the [facility] and check in at the registration area.   Wear comfortable clothing and clothing appropriate for easy access to any Portacath or PICC line.   We strive to give you quality time with your provider. You may need to reschedule your appointment if you arrive late (15 or more minutes).  Arriving late affects you and other patients whose appointments are after yours.  Also, if you miss three or more appointments without notifying the office, you may be dismissed from the clinic at the provider's discretion.      For prescription refill requests, have your pharmacy contact our office and allow 72 hours for refills to be completed.    Today you received the following chemotherapy and/or immunotherapy agents: Bevacizumab   To help prevent nausea and vomiting after your treatment, we encourage you to take your nausea medication as directed.  BELOW ARE SYMPTOMS THAT SHOULD BE REPORTED IMMEDIATELY: *FEVER GREATER THAN 100.4 F (38 C) OR HIGHER *CHILLS OR SWEATING *NAUSEA AND VOMITING THAT IS NOT CONTROLLED WITH YOUR NAUSEA MEDICATION *UNUSUAL SHORTNESS OF BREATH *UNUSUAL BRUISING OR BLEEDING *URINARY PROBLEMS (pain or burning when urinating, or frequent urination) *BOWEL PROBLEMS (unusual diarrhea, constipation, pain near the anus) TENDERNESS IN MOUTH AND THROAT WITH OR WITHOUT PRESENCE OF ULCERS (sore throat, sores in mouth, or a toothache) UNUSUAL RASH, SWELLING OR PAIN  UNUSUAL VAGINAL DISCHARGE OR ITCHING   Items with * indicate a potential emergency and should be followed up as soon as possible or go to the Emergency Department if any problems should occur.  Please show the  CHEMOTHERAPY ALERT CARD or IMMUNOTHERAPY ALERT CARD at check-in to the Emergency Department and triage nurse.  Should you have questions after your visit or need to cancel or reschedule your appointment, please contact [facility]  Dept: [phone]  and follow the prompts.  Office hours are 8:00 a.m. to 4:30 p.m. Monday - Friday. Please note that voicemails left after 4:00 p.m. may not be returned until the following business day.  We are closed weekends and major holidays. You have access to a nurse at all times for urgent questions. Please call the main number to the clinic Dept: [phone] and follow the prompts.   For any non-urgent questions, you may also contact your provider using MyChart. We now offer e-Visits for anyone 37 and older to request care online for non-urgent symptoms. For details visit [email or web address].   Also download the MyChart app! Go to the app store, search "MyChart", open the app, select [facility], and log in with your MyChart username and password.  Due to Covid, a mask is required upon entering the hospital/clinic. If you do not have a mask, one will be given to you upon arrival. For doctor visits, patients may have 1 support person aged 50 or older with them. For treatment visits, patients cannot have anyone with them due to current Covid guidelines and our immunocompromised population.   Filgrastim, G-CSF injection (Zarxio) What is this medication? FILGRASTIM, G-CSF (fil GRA stim) is a granulocyte colony-stimulating factor that stimulates the growth of neutrophils, a type of white blood cell (WBC) important in the body's fight against infection. It is used  to reduce the incidence of fever and infection in patients with certain types of cancer who are receiving chemotherapy that affects the bone marrow, to stimulate blood cell production for removal of WBCs from the body prior to a bone marrow transplantation, to reduce the  incidence of fever and infection in patients who have severe chronic neutropenia, and to improve survival outcomes following high-dose radiation exposure that is toxic to the bone marrow. This medicine may be used for other purposes; ask your health care provider or pharmacist if you have questions. COMMON BRAND NAME(S): Neupogen, Nivestym, Releuko, Zarxio What should I tell my care team before I take this medication? They need to know if you have any of these conditions: kidney disease latex allergy ongoing radiation therapy sickle cell disease an unusual or allergic reaction to filgrastim, pegfilgrastim, other medicines, foods, dyes, or preservatives pregnant or trying to get pregnant breast-feeding How should I use this medication? This medicine is for injection under the skin or infusion into a vein. As an infusion into a vein, it is usually given by a health care professional in a hospital or clinic setting. If you get this medicine at home, you will be taught how to prepare and give this medicine. Refer to the Instructions for Use that come with your medication packaging. Use exactly as directed. Take your medicine at regular intervals. Do not take your medicine more often than directed. It is important that you put your used needles and syringes in a special sharps container. Do not put them in a trash can. If you do not have a sharps container, call your pharmacist or healthcare provider to get one. Talk to your pediatrician regarding the use of this medicine in children. While this drug may be prescribed for children as young as 7 months for selected conditions, precautions do apply. Overdosage: If you think you have taken too much of this medicine contact a poison control center or emergency room at once. NOTE: This medicine is only for you. Do not share this medicine with others. What if I miss a dose? It is important not to miss your dose. Call your doctor or health care professional  if you miss a dose. What may interact with this medication? This medicine may interact with the following medications: medicines that may cause a release of neutrophils, such as lithium This list may not describe all possible interactions. Give your health care provider a list of all the medicines, herbs, non-prescription drugs, or dietary supplements you use. Also tell them if you smoke, drink alcohol, or use illegal drugs. Some items may interact with your medicine. What should I watch for while using this medication? Your condition will be monitored carefully while you are receiving this medicine. You may need blood work done while you are taking this medicine. Talk to your health care provider about your risk of cancer. You may be more at risk for certain types of cancer if you take this medicine. What side effects may I notice from receiving this medication? Side effects that you should report to your doctor or health care professional as soon as possible: allergic reactions like skin rash, itching or hives, swelling of the face, lips, or tongue back pain dizziness or feeling faint fever pain, redness, or irritation at site where injected pinpoint red spots on the skin shortness of breath or breathing problems signs and symptoms of kidney injury like trouble passing urine, change in the amount of urine, or red or dark-brown urine stomach  or side pain, or pain at the shoulder swelling tiredness unusual bleeding or bruising Side effects that usually do not require medical attention (report to your doctor or health care professional if they continue or are bothersome): bone pain cough diarrhea hair loss headache muscle pain This list may not describe all possible side effects. Call your doctor for medical advice about side effects. You may report side effects to FDA at 1-800-FDA-1088. Where should I keep my medication? Keep out of the reach of children. Store in a refrigerator  between 2 and 8 degrees C (36 and 46 degrees F). Do not freeze. Keep in carton to protect from light. Throw away this medicine if vials or syringes are left out of the refrigerator for more than 24 hours. Throw away any unused medicine after the expiration date. NOTE: This sheet is a summary. It may not cover all possible information. If you have questions about this medicine, talk to your doctor, pharmacist, or health care provider.  2022 Elsevier/Gold Standard (2019-09-29 18:47:55)  Influenza (Flu) Vaccine (Inactivated or Recombinant): What You Need to Know 1. Why get vaccinated? Influenza vaccine can prevent influenza (flu). Flu is a contagious disease that spreads around the [place] every year, usually between October and May. Anyone can get the flu, but it is more dangerous for some people. Infants and young children, people 55 years and older, pregnant people, and people with certain health conditions or a weakened immune system are at greatest risk of flu complications. Pneumonia, bronchitis, sinus infections, and ear infections are examples of flu-related complications. If you have a medical condition, such as heart disease, cancer, or diabetes, flu can make it worse. Flu can cause fever and chills, sore throat, muscle aches, fatigue, cough, headache, and runny or stuffy nose. Some people may have vomiting and diarrhea, though this is more common in children than adults. In an average year, thousands of people in the [place] die from flu, and many more are hospitalized. Flu vaccine prevents millions of illnesses and flu-related visits to the doctor each year. 2. Influenza vaccines CDC recommends everyone 6 months and older get vaccinated every flu season. Children 6 months through 7 years of age may need 2 doses during a single flu season. Everyone else needs only 1 dose each flu season. It takes about 2 weeks for protection to develop after vaccination. There are many flu viruses,  and they are always changing. Each year a new flu vaccine is made to protect against the influenza viruses believed to be likely to cause disease in the upcoming flu season. Even when the vaccine doesn't exactly match these viruses, it may still provide some protection. Influenza vaccine does not cause flu. Influenza vaccine may be given at the same time as other vaccines. 3. Talk with your health care provider Tell your vaccination provider if the person getting the vaccine: Has had an allergic reaction after a previous dose of influenza vaccine, or has any severe, life-threatening allergies Has ever had Guillain-Barr Syndrome (also called "GBS") In some cases, your health care provider may decide to postpone influenza vaccination until a future visit. Influenza vaccine can be administered at any time during pregnancy. People who are or will be pregnant during influenza season should receive inactivated influenza vaccine. People with minor illnesses, such as a cold, may be vaccinated. People who are moderately or severely ill should usually wait until they recover before getting influenza vaccine. Your health care provider can give you more information. 4. Risks  of a vaccine reaction Soreness, redness, and swelling where the shot is given, fever, muscle aches, and headache can happen after influenza vaccination. There may be a very small increased risk of Guillain-Barr Syndrome (GBS) after inactivated influenza vaccine (the flu shot). Young children who get the flu shot along with pneumococcal vaccine (PCV13) and/or DTaP vaccine at the same time might be slightly more likely to have a seizure caused by fever. Tell your health care provider if a child who is getting flu vaccine has ever had a seizure. People sometimes faint after medical procedures, including vaccination. Tell your provider if you feel dizzy or have vision changes or ringing in the ears. As with any medicine, there is a very remote  chance of a vaccine causing a severe allergic reaction, other serious injury, or death. 5. What if there is a serious problem? An allergic reaction could occur after the vaccinated person leaves the clinic. If you see signs of a severe allergic reaction (hives, swelling of the face and throat, difficulty breathing, a fast heartbeat, dizziness, or weakness), call 9-1-1 and get the person to the nearest hospital. For other signs that concern you, call your health care provider. Adverse reactions should be reported to the Vaccine Adverse Event Reporting System (VAERS). Your health care provider will usually file this report, or you can do it yourself. Visit the VAERS website at www.vaers.SamedayNews.es or call 248 860 5598. VAERS is only for reporting reactions, and VAERS staff members do not give medical advice. 6. The National Vaccine Injury Compensation Program The [employer] Vaccine Injury Compensation Program (VICP) is a federal program that was created to compensate people who may have been injured by certain vaccines. Claims regarding alleged injury or death due to vaccination have a time limit for filing, which may be as short as two years. Visit the VICP website at GoldCloset.com.ee or call 864-635-8339 to learn about the program and about filing a claim. 7. How can I learn more? Ask your health care provider. Call your local or state health department. Visit the website of the Food and Drug Administration (FDA) for vaccine package inserts and additional information at TraderRating.uy. Contact the Centers for Disease Control and Prevention (CDC): Call (705)442-4332 (1-800-CDC-INFO) or Visit CDC's website at https://gibson.com/. Vaccine Information Statement Inactivated Influenza Vaccine (04/27/2020) This information is not intended to replace advice given to you by your health care provider. Make sure you discuss any questions you have with your health care  provider. Document Revised: 06/14/2020 Document Reviewed: 06/14/2020 Elsevier Patient Education  2022 [employer].

## 2021-06-27 NOTE — Progress Notes (Signed)
The following biosimilar Zarxio ([id number]) has been selected for use in this patient.  Larene Beach, PharmD

## 2021-06-27 NOTE — Progress Notes (Signed)
[facility] [place], [place] [address] [phone]   Interval Evaluation  Date of Service: 06/27/21 Patient Name: Christopher Tucker Patient MRN: [id number] Patient DOB: 1961/04/26 Provider: [name], MD  Identifying Statement:  [name] is a 60 y.o. male with left temporal anaplastic astrocytoma   Oncologic History: Oncology History  Glioblastoma, IDH-wildtype ([place])  05/14/2020 Initial Diagnosis   Anaplastic astrocytoma, IDH-wildtype ([place])   05/21/2020 - 07/02/2020 Radiation Therapy   IMRT and concurrent Temodar with Dr. Lisbeth Renshaw   07/30/2020 - 08/07/2020 Chemotherapy    Patient is on Treatment Plan: BRAIN GBM DUKE IRINOTECAN + BEVACIZUMAB D1,15 Q 28D   Patient is on Antibody Plan: BRAIN GBM BEVACIZUMAB 14D X 6 CYCLES     10/18/2020 -  Chemotherapy    Patient is on Treatment Plan: BRAIN GLIOBLASTOMA CONSOLIDATION TEMOZOLOMIDE DAYS 1-5 Q28 DAYS    Patient is on Antibody Plan: BRAIN GBM BEVACIZUMAB 14D X 6 CYCLES     04/18/2021 - 05/30/2021 Chemotherapy      Patient is on Antibody Plan: BRAIN GBM BEVACIZUMAB 14D X 6 CYCLES     06/13/2021 -  Chemotherapy    Patient is on Treatment Plan: BRAIN GLIOBLASTOMA CONSOLIDATION TEMOZOLOMIDE DAYS 1-5 Q28 DAYS    Patient is on Antibody Plan: BRAIN GBM BEVACIZUMAB 14D X 6 CYCLES        Biomarkers:  MGMT Unknown.  IDH 1/2 Wild type.  EGFR Amplified  TERT "Mutated   Interval History:  [name] presents today for avastin infusion, just having dosed first cycle of 5-day Temodar.  No issues with the chemo this month, aside from nausea after neglecting zofran the first night.  He and his wife report increase in fatigue, lethargy and word finding difficulty since the chemo week, but he also has had cold symptoms.  Gait remains independent, with assistance nearby if needed.  Denies seizures or headaches.  Depakote still at 555m twice per day.    Decadron: 10/05/20:  826m01/28/22: 60m33m2/24/22: 1mg66m/04/22: - 06/27/21: 2mg 52mP (05/10/20) Patient presented to medical attention in early August with several months history of sensory episodes.  They are described as "numbness marching down right arm also involving the leg, lasting for several minutes".  There is no post event weakness or confusion.  No known history of seizures.  In recent weeks he has also experienced some difficulty with expressive language, finding particular words and maintaining high fluency.  Since starting Keppra post-operatively he has not had any recurrence of these sensory events.  He has weaned off dexamethasone.  We are still awaiting finalized pathology results.    Medications: Current Outpatient Medications on File Prior to Visit  Medication Sig Dispense Refill   amLODipine (NORVASC) 5 MG tablet Take 1 tablet (5 mg total) by mouth daily. 30 tablet 3   divalproex (DEPAKOTE) 500 MG DR tablet TAKE 2 TABLETS BY MOUTH TWICE A DAY 120 tablet 3   dronabinol (MARINOL) 2.5 MG capsule Take 1 capsule (2.5 mg total) by mouth 2 (two) times daily before a meal. (Patient not taking: No sig reported) 60 capsule 2   gabapentin (NEURONTIN) 300 MG capsule Take 1 capsule (300 mg total) by mouth 2 (two) times daily. (Patient not taking: No sig reported) 60 capsule 3   hydrocortisone (CORTEF) 10 MG tablet TAKE 1 TABLET BY MOUTH TWICE A DAY (Patient not taking: No sig reported) 60 tablet 1   ibuprofen (ADVIL) 200 MG tablet  Take 400 mg by mouth 2 (two) times daily. (Patient not taking: No sig reported)     methylphenidate (RITALIN) 5 MG tablet Take 1 tablet (5 mg total) by mouth 2 (two) times daily. (Patient not taking: No sig reported) 60 tablet 0   metoprolol tartrate 75 MG TABS Take 75 mg by mouth 2 (two) times daily. 60 tablet 2   ondansetron (ZOFRAN) 8 MG tablet Take 1 tablet (8 mg total) by mouth 2 (two) times daily as needed (nausea and vomiting). May take 30-60 minutes prior to Temodar  administration if nausea/vomiting occurs. 30 tablet 1   prochlorperazine (COMPAZINE) 10 MG tablet Take 1 tablet (10 mg total) by mouth every 6 (six) hours as needed for nausea or vomiting. 30 tablet 0   sertraline (ZOLOFT) 25 MG tablet TAKE 1 TABLET (25 MG TOTAL) BY MOUTH DAILY. 30 tablet 3   simvastatin (ZOCOR) 40 MG tablet Take 40 mg by mouth at bedtime. (Patient not taking: No sig reported)     temozolomide (TEMODAR) 140 MG capsule Take 1 capsule (140 mg total) by mouth daily. Take for 5 days then hold for 23 days. May take on an empty stomach to decrease nausea & vomiting. Take with the 122m capsule for a total dose of 3230m 5 capsule 0   temozolomide (TEMODAR) 180 MG capsule Take 1 capsule (180 mg total) by mouth daily. Take for 5 days then hold for 23 days. May take on an empty stomach to decrease nausea & vomiting. Take with the 14048mapsule for a total dose of 320m34m capsule 0   No current facility-administered medications on file prior to visit.    Allergies:  Allergies  Allergen Reactions   Pravastatin     Other reaction(s): muscle aches   Past Medical History:  Past Medical History:  Diagnosis Date   High cholesterol    History of kidney stones    Past Surgical History:  Past Surgical History:  Procedure Laterality Date   APPLICATION OF CRANIAL NAVIGATION Left 04/25/2020   Procedure: APPLICATION OF CRANIAL NAVIGATION;  Surgeon: NundConsuella Lose;  [facility];  Laterality: Left;   CRANIOTOMY Left 04/25/2020   Procedure: STEREOTACTIC LEFT TEMPORAL CRANIOTOMY FOR TUMOR;  Surgeon: NundConsuella Lose;  [facility];  Laterality: Left;   HERNIA REPAIR Left    groin    Social History:  Social History   Socioeconomic History   Marital status: Married    Spouse name: Not on file   Number of children: Not on file   Years of education: Not on file   Highest education level: Not on file  Occupational History    Not on file  Tobacco Use   Smoking status: Never   Smokeless tobacco: Never  Substance and Sexual Activity   Alcohol use: Yes    Alcohol/week: 6.0 standard drinks    Types: 3 Glasses of wine, 3 Cans of beer per week   Drug use: Not Currently   Sexual activity: Not on file  Other Topics Concern   Not on file  Social History Narrative   Not on file   Social Determinants of Health   Financial Resource Strain: Not on file  Food Insecurity: Not on file  Transportation Needs: Not on file  Physical Activity: Not on file  Stress: Not on file  Social Connections: Not on file  Intimate Partner Violence: Not on file   Family History: No family history on file.  Review of Systems: Constitutional: Doesn't report fevers, chills or abnormal weight loss Eyes: Doesn't report blurriness of vision Ears, nose, mouth, throat, and face: Doesn't report sore throat Respiratory: Doesn't report cough, dyspnea or wheezes Cardiovascular: Doesn't report palpitation, chest discomfort  Gastrointestinal:  Doesn't report nausea, constipation, diarrhea GU: Doesn't report incontinence Skin: torso rash Neurological: Per HPI Musculoskeletal: Doesn't report joint pain Behavioral/Psych: Doesn't report anxiety  Physical Exam: Vitals:   06/27/21 1018  BP: (!) 135/95  Pulse: 62  Resp: 17  Temp: 97.8 F (36.6 C)  SpO2: 100%   KPS: 70. General: Comfortable, NAD Head: Normal EENT: No conjunctival injection or scleral icterus.  Lungs: Resp effort normal Cardiac: Regular rate Abdomen: Non-distended abdomen Skin: normal Extremities: normal  Neurologic Exam: Mental Status: Awake, alert, attentive to examiner. Oriented to self and environment. Language c/w transcortical expressive dyshpasia Cranial Nerves: Visual acuity is grossly normal. Right field hemianopia. Extra-ocular movements intact. No ptosis. Face is symmetric Motor: Tone and bulk are normal. 4+/5 in right arm and leg. Reflexes are symmetric,  no pathologic reflexes present.  Sensory: Intact to light touch Gait: Dystaxic, hemiparetic  Labs: I have reviewed the data as listed    Component Value Date/Time   NA 139 06/13/2021 1053   K 4.2 06/13/2021 1053   CL 108 06/13/2021 1053   CO2 23 06/13/2021 1053   GLUCOSE 88 06/13/2021 1053   BUN 11 06/13/2021 1053   CREATININE 0.78 06/13/2021 1053   CALCIUM 9.2 06/13/2021 1053   PROT 6.8 06/13/2021 1053   ALBUMIN 3.7 06/13/2021 1053   AST 16 06/13/2021 1053   ALT 16 06/13/2021 1053   ALKPHOS 43 06/13/2021 1053   BILITOT 0.7 06/13/2021 1053   GFRNONAA >60 06/13/2021 1053   GFRAA >60 06/14/2020 0828   Lab Results  Component Value Date   WBC 5.7 06/27/2021   NEUTROABS 2.9 06/27/2021   HGB 14.7 06/27/2021   HCT 42.9 06/27/2021   MCV 91.7 06/27/2021   PLT 170 06/27/2021   Imaging:  [facility] Clinician Interpretation: I have personally reviewed the CNS images as listed.  My interpretation, in the context of the patient's clinical presentation, is progressive disease  MR BRAIN W WO CONTRAST  Result Date: 06/07/2021 CLINICAL DATA:  GBM. EXAM: MRI HEAD WITHOUT AND WITH CONTRAST TECHNIQUE: Multiplanar, multiecho pulse sequences of the brain and surrounding structures were obtained without and with intravenous contrast. CONTRAST:  8m GADAVIST GADOBUTROL 1 MMOL/ML IV SOLN COMPARISON:  MR head without and with contrast 04/12/2021, 02/01/2021 and 12/06/2020 FINDINGS: Brain: Tumor progression noted with increased enhancement of the posterior and superior aspect of the surgical cavity. Overall size of enhancing tissue is increased on the sagittal view, now measuring 6.4 x 3.9 cm. Increased T2 signal is present with subcortical extension in the left parietal lobe on image 35 of series 11. T2 FLAIR signal changes are otherwise similar the prior exams. No significant right-sided enhancement is present. There is mass effect with partial effacement of the atrium of the right lateral ventricle. Minimal  midline shift is stable. New areas of restricted diffusion are present. Progressive blood products noted. White matter changes extend into the brainstem on the left. Brainstem and cerebellum are otherwise within normal limits. Vascular: Flow is present in the major intracranial arteries. Skull and upper cervical spine: The craniocervical junction is normal. Upper cervical spine is within normal limits. Marrow signal is unremarkable. Sinuses/Orbits: The paranasal sinuses and mastoid air cells are clear. The globes and orbits are within normal limits.  IMPRESSION: 1. Increasing size of enhancing tissue along the posterior and superior aspect of the surgical cavity concerning for tumor progression. 2. Increased T2 FLAIR signal changes with subcortical extension in the left parietal lobe also suggests tumor progression. 3. New areas of restricted diffusion and internal hemorrhage. Electronically Signed   By: San Morelle M.D.   On: 06/07/2021 15:48    Assessment/Plan Glioblastoma, IDH-wildtype ([place]) [C71.9]  Taavi Hoose is clinically stable today, now having dosed first cycle of adjuvant 5-day Temodar.    We recommended continuing treatment with cycle #1 (day 8/28) Temozolomide 111m/m2, on for five days and off for twenty three days in twenty eight day cycles. The patient will have a complete blood count performed on days 21 and 28 of each cycle, and a comprehensive metabolic panel performed on day 28 of each cycle. Labs may need to be performed more often. Zofran will prescribed for home use for nausea/vomiting.   Neulasta subq will be provided for bone marrow support today given severe thrombocytopenia with prior high dose Temodar dosing; dosed >24h following final Temodar dose.  He is clear to dose Avastin as well today.  Chemotherapy should be held for the following:  ANC less than 1,000  Platelets less than 100,000  LFT or creatinine greater than 2x ULN  If clinical  concerns/contraindications develop   Avastin should be held for the following:  ANC less than 1500  Platelets less than 50,000  LFT or creatinine greater than 2x ULN  If clinical concerns/contraindications develop  For lethargy, poor appetite, will dose trial of dexamethasone 260mdaily x7 days.  May stop if ineffective.  Will con't metoprolol 7529mID, amlodipine 5mg33mily.  Ok to continue Depakote 500mg64m, no breathrough seizures.  We ask that Aydan Piero Mustardrn to in-person clinic in 2 weeks for avastin.  Next MRI brain should be November 16th, as discussed.  All questions were answered. The patient knows to call the clinic with any problems, questions or concerns. No barriers to learning were detected.  I have spent a total of 30 minutes of face-to-face and non-face-to-face time, excluding clinical staff time, preparing to see patient, ordering tests and/or medications, counseling the patient, and documenting clinical information in the electronic or other health record    ZachaVentura SellersMedical Director of Neuro-Oncology [facility]6/22 10:14 AM

## 2021-06-28 ENCOUNTER — Other Ambulatory Visit: Payer: Self-pay | Admitting: Internal Medicine

## 2021-06-28 DIAGNOSIS — C719 Malignant neoplasm of brain, unspecified: Secondary | ICD-10-CM

## 2021-07-01 NOTE — Telephone Encounter (Signed)
Patient will have labs and then determine if refill is appropriate.

## 2021-07-02 ENCOUNTER — Other Ambulatory Visit: Payer: 59 | Admitting: Nurse Practitioner

## 2021-07-02 ENCOUNTER — Other Ambulatory Visit: Payer: Self-pay

## 2021-07-02 ENCOUNTER — Encounter: Payer: Self-pay | Admitting: Nurse Practitioner

## 2021-07-02 ENCOUNTER — Encounter: Payer: Self-pay | Admitting: Internal Medicine

## 2021-07-02 DIAGNOSIS — F4321 Adjustment disorder with depressed mood: Secondary | ICD-10-CM

## 2021-07-02 DIAGNOSIS — C719 Malignant neoplasm of brain, unspecified: Secondary | ICD-10-CM

## 2021-07-02 DIAGNOSIS — Z515 Encounter for palliative care: Secondary | ICD-10-CM

## 2021-07-02 NOTE — Progress Notes (Signed)
[facility] Consult Note Telephone: [phone]  Fax: [phone]    Date of encounter: 07/02/21 5:04 PM PATIENT NAME: Christopher Tucker [address] [place] [address]   [phone] (home)  DOB: 1960/10/04 MRN: [id number] PRIMARY CARE PROVIDER:    Alroy Dust, L.Marlou Sa, MD,  [place] [employer] [place]  [place] [address] [phone]  RESPONSIBLE PARTY:    Contact Information     Name Relation Home Work [place] Spouse [phone]        Due to the COVID-19 crisis, this visit was done via telemedicine from my office and it was initiated and consent by this patient and or family.  I connected with Christopher Tucker, Mr Montesinos wife with Kriston Pasquarello OR [facility] on 07/02/21 by a phone as video not available enabled telemedicine application and verified that I am speaking with the correct perso.   I discussed the limitations of evaluation and management by telemedicine. The patient expressed understanding and agreed to proceed. Palliative Care was asked to follow this patient by consultation request of  Mitchell, L.Marlou Sa, MD to address advance care planning and complex medical decision making. This is a follow up visit.                                  ASSESSMENT AND PLAN / RECOMMENDATIONS:  Symptom Management/Plan: 1. Advance Care Planning; Full code;     2. Situational grief secondary to Glioblastoma continue working with bereavement counselor, encouraged to schedule f/u visit. Mrs. Haris in agreement to schedule   3. Goals of Care: Goals include to maximize quality of life and symptom management. Our advance care planning conversation included a discussion about:    The value and importance of advance care planning  Exploration of personal, cultural or spiritual beliefs that might influence medical decisions  Exploration of goals of care in the event of a sudden injury or illness  Identification and preparation of a healthcare  agent  Review and updating or creation of an advance directive document.   4. Palliative care encounter; Palliative care encounter; Palliative medicine team will continue to support patient, patient's family, and medical team. Visit consisted of counseling and education dealing with the complex and emotionally intense issues of symptom management and palliative care in the setting of serious and potentially life-threatening illness   5. f/u 2 month for ongoing monitoring chronic disease progression, ongoing discussions complex medical decision making  Follow up Palliative Care Visit: Palliative care will continue to follow for complex medical decision making, advance care planning, and clarification of goals. Return 8 weeks or prn.  I spent 47 minutes providing this consultation. More than 50% of the time in this consultation was spent in counseling and care coordination.  PPS: 50%  HOSPICE ELIGIBILITY/DIAGNOSIS: TBD  Chief Complaint: Follow up palliative consult for complex medical decision making  HISTORY OF PRESENT ILLNESS:  Adriann Ballweg is a 60 y.o. year old male  with multiple medical problems including Glioblastoma, IDH-wildtype, craniotomy, hyperlipidemia, history of sciatica. I called Mrs. Slowey for f/u PC visit telemedicine/telephonic as video not available. Mrs. Libbey and I talked about how Mr. Volkert has been doing. Mrs. Havlik updated on last Oncology visit with Dr [name] with recent imaging showing Glioblastoma continues to increase despite infusions,. Ms. Pierre endorses Mr. Raatz has to receive the avastin infusions q2 weeks. Ms. Aden talked about overall functional changes of difficulty  with ambulating, dragging his foot at times. Ms. Derflinger endorses Mr. Sally continues to sleep a lot, also napping during the Christopher. Ms. Rafferty endorses Dr Lavona Mound to restart Decadron but Mr. Datta declined. Ms. Laura talked about recent trip to the mountains they took last weekend. Ms.  Dunker endorses trails they would have gone on even simple paths, Mr. Terhune was not interested. We talked about overall decline, functionally, cognitively. Ms. Hsiao endorses cognitively Mr. Cappelletti speech is becoming more difficulty to get words out, difficulty with memory. We talked about realistic expectations with progressive decline despite treatments. We talked about bereavement counselor and importance of continuing discussions surrounding grief, processing as Mr. Zeis progresses in his disease. We talked about with cognitive and functional changes on top of the disease progression would be beneficial since Mr. Jaco has a good rapport with Soil scientist, bereavement counselor. We talked about palliative consults which Mr. Wade associates with hospice, words as terminal which he has been avoiding. We talked about quality of life. Christopher Tucker endorses she was in agreement with making another appointment with Thurmond Butts. Mr. Kazmierski joined visit, we talked about how he has been feeling. Mr. Benney talked about symptoms, treatments, overall challenges, barriers. We talked about their trip this past weekend. We talked about grieving, continuing with Ryan. Mr. Bohanon in agreement. Mr. Trombly talked at length about his relationship with Ms. Woelfel and his concerns that he may not have been the person she would have wanted him to be. Mr. Down talked a lot about his behaviors, his interactions, how he interacts with people. We talked about the glioblastoma has changed his life, the way he sees things with his logical thinking in the setting of cognitive changes which he is aware of. We talked about coping strategies. We talked about coping strategies. We talked about role pc in poc. We talked about f/u pc visit. Mr. Mcwhirter in agreement as well as f/u with Thurmond Butts. Therapeutic listening, emotional support provided.  History obtained from review of EMR, discussion with Mrs and  Mr. Helming.  I reviewed available labs,  medications, imaging, studies and related documents from the EMR.  Records reviewed and summarized above.   ROS Full 10 system review of systems performed and negative with exception of: as per HPI.   Physical Exam: deferred Questions and concerns were addressed. The patient/family was encouraged to call with questions and/or concerns. My contact information was provided. Provided general support and encouragement, no other unmet needs identified   Thank you for the opportunity to participate in the care of Mr. Gary.  The palliative care team will continue to follow. Please call our office at [phone] if we can be of additional assistance.   This chart was dictated using voice recognition software.  Despite best efforts to proofread,  errors can occur which can change the documentation meaning.   Maclean Foister Ihor Gully, NP

## 2021-07-11 ENCOUNTER — Inpatient Hospital Stay: Payer: 59

## 2021-07-11 ENCOUNTER — Other Ambulatory Visit: Payer: Self-pay

## 2021-07-11 ENCOUNTER — Inpatient Hospital Stay (HOSPITAL_BASED_OUTPATIENT_CLINIC_OR_DEPARTMENT_OTHER): Payer: 59 | Admitting: Internal Medicine

## 2021-07-11 ENCOUNTER — Encounter: Payer: Self-pay | Admitting: Internal Medicine

## 2021-07-11 VITALS — BP 145/99 | HR 59 | Temp 97.7°F | Resp 18 | Ht 71.0 in | Wt 197.4 lb

## 2021-07-11 DIAGNOSIS — R569 Unspecified convulsions: Secondary | ICD-10-CM

## 2021-07-11 DIAGNOSIS — C719 Malignant neoplasm of brain, unspecified: Secondary | ICD-10-CM

## 2021-07-11 DIAGNOSIS — Z5112 Encounter for antineoplastic immunotherapy: Secondary | ICD-10-CM | POA: Diagnosis not present

## 2021-07-11 LAB — CBC WITH DIFFERENTIAL (CANCER CENTER ONLY)
Abs Immature Granulocytes: 0.02 10*3/uL (ref 0.00–0.07)
Basophils Absolute: 0 10*3/uL (ref 0.0–0.1)
Basophils Relative: 1 %
Eosinophils Absolute: 0.1 10*3/uL (ref 0.0–0.5)
Eosinophils Relative: 1 %
HCT: 44.6 % (ref 39.0–52.0)
Hemoglobin: 14.9 g/dL (ref 13.0–17.0)
Immature Granulocytes: 0 %
Lymphocytes Relative: 23 %
Lymphs Abs: 1.5 10*3/uL (ref 0.7–4.0)
MCH: 31 pg (ref 26.0–34.0)
MCHC: 33.4 g/dL (ref 30.0–36.0)
MCV: 92.7 fL (ref 80.0–100.0)
Monocytes Absolute: 0.9 10*3/uL (ref 0.1–1.0)
Monocytes Relative: 15 %
Neutro Abs: 3.8 10*3/uL (ref 1.7–7.7)
Neutrophils Relative %: 60 %
Platelet Count: 182 10*3/uL (ref 150–400)
RBC: 4.81 MIL/uL (ref 4.22–5.81)
RDW: 14.8 % (ref 11.5–15.5)
WBC Count: 6.4 10*3/uL (ref 4.0–10.5)
nRBC: 0 % (ref 0.0–0.2)

## 2021-07-11 LAB — CMP (CANCER CENTER ONLY)
ALT: 15 U/L (ref 0–44)
AST: 16 U/L (ref 15–41)
Albumin: 3.9 g/dL (ref 3.5–5.0)
Alkaline Phosphatase: 43 U/L (ref 38–126)
Anion gap: 10 (ref 5–15)
BUN: 16 mg/dL (ref 6–20)
CO2: 22 mmol/L (ref 22–32)
Calcium: 9.3 mg/dL (ref 8.9–10.3)
Chloride: 107 mmol/L (ref 98–111)
Creatinine: 0.8 mg/dL (ref 0.61–1.24)
GFR, Estimated: >60 mL/min (ref >60)
Glucose, Bld: 87 mg/dL (ref 70–99)
Potassium: 4.6 mmol/L (ref 3.5–5.1)
Sodium: 139 mmol/L (ref 135–145)
Total Bilirubin: 0.8 mg/dL (ref 0.3–1.2)
Total Protein: 7.5 g/dL (ref 6.5–8.1)

## 2021-07-11 LAB — TOTAL PROTEIN, URINE DIPSTICK: Protein, ur: <30 mg/dL — AB

## 2021-07-11 MED ORDER — SODIUM CHLORIDE 0.9 % IV SOLN
10.0000 mg/kg | Freq: Once | INTRAVENOUS | Status: AC
  Administered 2021-07-11: 900 mg via INTRAVENOUS
  Filled 2021-07-11: qty 32

## 2021-07-11 MED ORDER — TEMOZOLOMIDE 140 MG PO CAPS
140.0000 mg | ORAL_CAPSULE | Freq: Every day | ORAL | 0 refills | Status: DC
Start: 2021-07-11 — End: 2021-07-25

## 2021-07-11 MED ORDER — TEMOZOLOMIDE 180 MG PO CAPS: 180.0000 mg | ORAL_CAPSULE | Freq: Every day | ORAL | 0 refills | Status: DC

## 2021-07-11 MED ORDER — SODIUM CHLORIDE 0.9 % IV SOLN: Freq: Once | INTRAVENOUS | Status: AC

## 2021-07-11 NOTE — Progress Notes (Signed)
[facility] [place], [place] [address] [phone]   Interval Evaluation  Date of Service: 07/11/21 Patient Name: Christopher Tucker Patient MRN: [id number] Patient DOB: 26-Jun-1961 Provider: [name], MD  Identifying Statement:  Yaniv Lage is a 60 y.o. male with left temporal anaplastic astrocytoma   Oncologic History: Oncology History  Glioblastoma, IDH-wildtype ([place])  05/14/2020 Initial Diagnosis   Anaplastic astrocytoma, IDH-wildtype ([place])   05/21/2020 - 07/02/2020 Radiation Therapy   IMRT and concurrent Temodar with Dr. Lisbeth Renshaw   07/30/2020 - 08/07/2020 Chemotherapy    Patient is on Treatment Plan: BRAIN GBM DUKE IRINOTECAN + BEVACIZUMAB D1,15 Q 28D   Patient is on Antibody Plan: BRAIN GBM BEVACIZUMAB 14D X 6 CYCLES     10/18/2020 -  Chemotherapy   Patient is on Treatment Plan : BRAIN GBM Bevacizumab 14d x 6 cycles     04/18/2021 - 05/30/2021 Chemotherapy      Patient is on Antibody Plan: BRAIN GBM BEVACIZUMAB 14D X 6 CYCLES     06/13/2021 -  Chemotherapy   Patient is on Treatment Plan : BRAIN GLIOBLASTOMA Consolidation Temozolomide Days 1-5 q28 Days         Biomarkers:  MGMT Unknown.  IDH 1/2 Wild type.  EGFR Amplified  TERT "Mutated   Interval History:  [name] presents today for avastin infusion, now having completed first cycle of 5-day Temodar.  No signficant clinical changes today. He and his wife again report increase in fatigue, lethargy and word finding difficulty since the chemo week, but he also has had cold symptoms.  Gait remains independent, with assistance nearby if needed.  Denies seizures or headaches.  Depakote still at 540m twice per day.  They did enjoy their trip to the mountains last weekend.  Decadron: 10/05/20: 8741m01/28/22: 41m21m2/24/22: 1mg23m/04/22: - 06/27/21: 2mg 60mP (05/10/20) Patient presented to medical attention in early August with several months history of sensory  episodes.  They are described as "numbness marching down right arm also involving the leg, lasting for several minutes".  There is no post event weakness or confusion.  No known history of seizures.  In recent weeks he has also experienced some difficulty with expressive language, finding particular words and maintaining high fluency.  Since starting Keppra post-operatively he has not had any recurrence of these sensory events.  He has weaned off dexamethasone.  We are still awaiting finalized pathology results.    Medications: Current Outpatient Medications on File Prior to Visit  Medication Sig Dispense Refill   amLODipine (NORVASC) 5 MG tablet Take 1 tablet (5 mg total) by mouth daily. 30 tablet 3   divalproex (DEPAKOTE) 500 MG DR tablet TAKE 2 TABLETS BY MOUTH TWICE A DAY 120 tablet 3   metoprolol tartrate 75 MG TABS Take 75 mg by mouth 2 (two) times daily. 60 tablet 2   ondansetron (ZOFRAN) 8 MG tablet Take 1 tablet (8 mg total) by mouth 2 (two) times daily as needed (nausea and vomiting). May take 30-60 minutes prior to Temodar administration if nausea/vomiting occurs. 30 tablet 1   prochlorperazine (COMPAZINE) 10 MG tablet Take 1 tablet (10 mg total) by mouth every 6 (six) hours as needed for nausea or vomiting. 30 tablet 0   sertraline (ZOLOFT) 25 MG tablet TAKE 1 TABLET (25 MG TOTAL) BY MOUTH DAILY. 30 tablet 3   dronabinol (MARINOL) 2.5 MG capsule Take 1 capsule (2.5 mg total) by mouth 2 (two) times  daily before a meal. (Patient not taking: No sig reported) 60 capsule 2   gabapentin (NEURONTIN) 300 MG capsule Take 1 capsule (300 mg total) by mouth 2 (two) times daily. (Patient not taking: No sig reported) 60 capsule 3   hydrocortisone (CORTEF) 10 MG tablet TAKE 1 TABLET BY MOUTH TWICE A DAY (Patient not taking: No sig reported) 60 tablet 1   ibuprofen (ADVIL) 200 MG tablet Take 400 mg by mouth 2 (two) times daily. (Patient not taking: No sig reported)     methylphenidate (RITALIN) 5 MG  tablet Take 1 tablet (5 mg total) by mouth 2 (two) times daily. (Patient not taking: No sig reported) 60 tablet 0   simvastatin (ZOCOR) 40 MG tablet Take 40 mg by mouth at bedtime. (Patient not taking: No sig reported)     temozolomide (TEMODAR) 140 MG capsule Take 1 capsule (140 mg total) by mouth daily. Take for 5 days then hold for 23 days. May take on an empty stomach to decrease nausea & vomiting. Take with the 150m capsule for a total dose of 3229m (Patient not taking: No sig reported) 5 capsule 0   temozolomide (TEMODAR) 180 MG capsule Take 1 capsule (180 mg total) by mouth daily. Take for 5 days then hold for 23 days. May take on an empty stomach to decrease nausea & vomiting. Take with the 14061mapsule for a total dose of 320m71mPatient not taking: No sig reported) 5 capsule 0   No current facility-administered medications on file prior to visit.    Allergies:  Allergies  Allergen Reactions   Pravastatin     Other reaction(s): muscle aches   Past Medical History:  Past Medical History:  Diagnosis Date   High cholesterol    History of kidney stones    Past Surgical History:  Past Surgical History:  Procedure Laterality Date   APPLICATION OF CRANIAL NAVIGATION Left 04/25/2020   Procedure: APPLICATION OF CRANIAL NAVIGATION;  Surgeon: NundConsuella Lose;  [facility];  Laterality: Left;   CRANIOTOMY Left 04/25/2020   Procedure: STEREOTACTIC LEFT TEMPORAL CRANIOTOMY FOR TUMOR;  Surgeon: NundConsuella Lose;  [facility];  Laterality: Left;   HERNIA REPAIR Left    groin    Social History:  Social History   Socioeconomic History   Marital status: Married    Spouse name: Not on file   Number of children: Not on file   Years of education: Not on file   Highest education level: Not on file  Occupational History   Not on file  Tobacco Use   Smoking status: Never   Smokeless tobacco: Never  Substance and Sexual  Activity   Alcohol use: Yes    Alcohol/week: 6.0 standard drinks    Types: 3 Glasses of wine, 3 Cans of beer per week   Drug use: Not Currently   Sexual activity: Not on file  Other Topics Concern   Not on file  Social History Narrative   Not on file   Social Determinants of Health   Financial Resource Strain: Not on file  Food Insecurity: Not on file  Transportation Needs: Not on file  Physical Activity: Not on file  Stress: Not on file  Social Connections: Not on file  Intimate Partner Violence: Not on file   Family History: No family history on file.  Review of Systems: Constitutional: Doesn't report fevers, chills or abnormal weight loss Eyes: Doesn't report blurriness of vision Ears,  nose, mouth, throat, and face: Doesn't report sore throat Respiratory: Doesn't report cough, dyspnea or wheezes Cardiovascular: Doesn't report palpitation, chest discomfort  Gastrointestinal:  Doesn't report nausea, constipation, diarrhea GU: Doesn't report incontinence Skin: torso rash Neurological: Per HPI Musculoskeletal: Doesn't report joint pain Behavioral/Psych: Doesn't report anxiety  Physical Exam: Vitals:   07/11/21 0947  BP: (!) 145/99  Pulse: (!) 59  Resp: 18  Temp: 97.7 F (36.5 C)  SpO2: 100%   KPS: 70. General: Comfortable, NAD Head: Normal EENT: No conjunctival injection or scleral icterus.  Lungs: Resp effort normal Cardiac: Regular rate Abdomen: Non-distended abdomen Skin: normal Extremities: normal  Neurologic Exam: Mental Status: Awake, alert, attentive to examiner. Oriented to self and environment. Language c/w transcortical expressive dyshpasia Cranial Nerves: Visual acuity is grossly normal. Right field hemianopia. Extra-ocular movements intact. No ptosis. Face is symmetric Motor: Tone and bulk are normal. 4+/5 in right arm and leg. Reflexes are symmetric, no pathologic reflexes present.  Sensory: Intact to light touch Gait: Dystaxic,  hemiparetic  Labs: I have reviewed the data as listed    Component Value Date/Time   NA 139 07/11/2021 0947   K 4.6 07/11/2021 0947   CL 107 07/11/2021 0947   CO2 22 07/11/2021 0947   GLUCOSE 87 07/11/2021 0947   BUN 16 07/11/2021 0947   CREATININE 0.80 07/11/2021 0947   CALCIUM 9.3 07/11/2021 0947   PROT 7.5 07/11/2021 0947   ALBUMIN 3.9 07/11/2021 0947   AST 16 07/11/2021 0947   ALT 15 07/11/2021 0947   ALKPHOS 43 07/11/2021 0947   BILITOT 0.8 07/11/2021 0947   GFRNONAA >60 07/11/2021 0947   GFRAA >60 06/14/2020 0828   Lab Results  Component Value Date   WBC 6.4 07/11/2021   NEUTROABS 3.8 07/11/2021   HGB 14.9 07/11/2021   HCT 44.6 07/11/2021   MCV 92.7 07/11/2021   PLT 182 07/11/2021     Assessment/Plan Glioblastoma, IDH-wildtype ([place]) [C71.9]  Rey Dansby is clinically stable today, now nearly complete first cycle of adjuvant 5-day Temodar.    We recommended continuing treatment with cycle #2 Temozolomide 149m/m2, on for five days and off for twenty three days in twenty eight day cycles. The patient will have a complete blood count performed on days 21 and 28 of each cycle, and a comprehensive metabolic panel performed on day 28 of each cycle. Labs may need to be performed more often. Zofran will prescribed for home use for nausea/vomiting.   He should begin cycle #2 on 07/18/21.  Neulasta subq will be provided for bone marrow support at next visit in 2 weeks given severe thrombocytopenia with prior high dose Temodar dosing; dosed >24h following final Temodar dose.  He is clear to dose Avastin as well today.  Chemotherapy should be held for the following:  ANC less than 1,000  Platelets less than 100,000  LFT or creatinine greater than 2x ULN  If clinical concerns/contraindications develop   Avastin should be held for the following:  ANC less than 1500  Platelets less than 50,000  LFT or creatinine greater than 2x ULN  If clinical  concerns/contraindications develop  Will con't metoprolol 722mBID, amlodipine 85m57maily.  Ok to continue Depakote 500m39mD, no breathrough seizures.  We ask that MarkZacharee Gaddieurn to in-person clinic in 2 weeks for avastin.  Next MRI brain should be November 16th, as discussed.  All questions were answered. The patient knows to call the clinic with any problems, questions or concerns. No barriers to  learning were detected.  I have spent a total of 30 minutes of face-to-face and non-face-to-face time, excluding clinical staff time, preparing to see patient, ordering tests and/or medications, counseling the patient, and documenting clinical information in the electronic or other health record    [name], MD Medical Director of Neuro-Oncology [facility] 07/11/21 12:37 PM

## 2021-07-11 NOTE — Patient Instructions (Signed)
[facility]  Discharge Instructions:  Take Tylenol or Claritin for bone pain related to Zarxio Thank you for choosing [facility] to provide your oncology and hematology care.   If you have a lab appointment with the [facility], please go directly to the [facility] and check in at the registration area.   Wear comfortable clothing and clothing appropriate for easy access to any Portacath or PICC line.   We strive to give you quality time with your provider. You may need to reschedule your appointment if you arrive late (15 or more minutes).  Arriving late affects you and other patients whose appointments are after yours.  Also, if you miss three or more appointments without notifying the office, you may be dismissed from the clinic at the provider's discretion.      For prescription refill requests, have your pharmacy contact our office and allow 72 hours for refills to be completed.    Today you received the following chemotherapy and/or immunotherapy agents: Bevacizumab   To help prevent nausea and vomiting after your treatment, we encourage you to take your nausea medication as directed.  BELOW ARE SYMPTOMS THAT SHOULD BE REPORTED IMMEDIATELY: *FEVER GREATER THAN 100.4 F (38 C) OR HIGHER *CHILLS OR SWEATING *NAUSEA AND VOMITING THAT IS NOT CONTROLLED WITH YOUR NAUSEA MEDICATION *UNUSUAL SHORTNESS OF BREATH *UNUSUAL BRUISING OR BLEEDING *URINARY PROBLEMS (pain or burning when urinating, or frequent urination) *BOWEL PROBLEMS (unusual diarrhea, constipation, pain near the anus) TENDERNESS IN MOUTH AND THROAT WITH OR WITHOUT PRESENCE OF ULCERS (sore throat, sores in mouth, or a toothache) UNUSUAL RASH, SWELLING OR PAIN  UNUSUAL VAGINAL DISCHARGE OR ITCHING   Items with * indicate a potential emergency and should be followed up as soon as possible or go to the Emergency Department if any problems should occur.  Please show the  CHEMOTHERAPY ALERT CARD or IMMUNOTHERAPY ALERT CARD at check-in to the Emergency Department and triage nurse.  Should you have questions after your visit or need to cancel or reschedule your appointment, please contact [facility]  Dept: [phone]  and follow the prompts.  Office hours are 8:00 a.m. to 4:30 p.m. Monday - Friday. Please note that voicemails left after 4:00 p.m. may not be returned until the following business day.  We are closed weekends and major holidays. You have access to a nurse at all times for urgent questions. Please call the main number to the clinic Dept: [phone] and follow the prompts.   For any non-urgent questions, you may also contact your provider using MyChart. We now offer e-Visits for anyone 58 and older to request care online for non-urgent symptoms. For details visit [email or web address].   Also download the MyChart app! Go to the app store, search "MyChart", open the app, select [facility], and log in with your MyChart username and password.  Due to Covid, a mask is required upon entering the hospital/clinic. If you do not have a mask, one will be given to you upon arrival. For doctor visits, patients may have 1 support person aged 55 or older with them. For treatment visits, patients cannot have anyone with them due to current Covid guidelines and our immunocompromised population.   Filgrastim, G-CSF injection (Zarxio) What is this medication? FILGRASTIM, G-CSF (fil GRA stim) is a granulocyte colony-stimulating factor that stimulates the growth of neutrophils, a type of white blood cell (WBC) important in the body's fight against infection. It is used  to reduce the incidence of fever and infection in patients with certain types of cancer who are receiving chemotherapy that affects the bone marrow, to stimulate blood cell production for removal of WBCs from the body prior to a bone marrow transplantation, to reduce the  incidence of fever and infection in patients who have severe chronic neutropenia, and to improve survival outcomes following high-dose radiation exposure that is toxic to the bone marrow. This medicine may be used for other purposes; ask your health care provider or pharmacist if you have questions. COMMON BRAND NAME(S): Neupogen, Nivestym, Releuko, Zarxio What should I tell my care team before I take this medication? They need to know if you have any of these conditions: kidney disease latex allergy ongoing radiation therapy sickle cell disease an unusual or allergic reaction to filgrastim, pegfilgrastim, other medicines, foods, dyes, or preservatives pregnant or trying to get pregnant breast-feeding How should I use this medication? This medicine is for injection under the skin or infusion into a vein. As an infusion into a vein, it is usually given by a health care professional in a hospital or clinic setting. If you get this medicine at home, you will be taught how to prepare and give this medicine. Refer to the Instructions for Use that come with your medication packaging. Use exactly as directed. Take your medicine at regular intervals. Do not take your medicine more often than directed. It is important that you put your used needles and syringes in a special sharps container. Do not put them in a trash can. If you do not have a sharps container, call your pharmacist or healthcare provider to get one. Talk to your pediatrician regarding the use of this medicine in children. While this drug may be prescribed for children as young as 7 months for selected conditions, precautions do apply. Overdosage: If you think you have taken too much of this medicine contact a poison control center or emergency room at once. NOTE: This medicine is only for you. Do not share this medicine with others. What if I miss a dose? It is important not to miss your dose. Call your doctor or health care professional  if you miss a dose. What may interact with this medication? This medicine may interact with the following medications: medicines that may cause a release of neutrophils, such as lithium This list may not describe all possible interactions. Give your health care provider a list of all the medicines, herbs, non-prescription drugs, or dietary supplements you use. Also tell them if you smoke, drink alcohol, or use illegal drugs. Some items may interact with your medicine. What should I watch for while using this medication? Your condition will be monitored carefully while you are receiving this medicine. You may need blood work done while you are taking this medicine. Talk to your health care provider about your risk of cancer. You may be more at risk for certain types of cancer if you take this medicine. What side effects may I notice from receiving this medication? Side effects that you should report to your doctor or health care professional as soon as possible: allergic reactions like skin rash, itching or hives, swelling of the face, lips, or tongue back pain dizziness or feeling faint fever pain, redness, or irritation at site where injected pinpoint red spots on the skin shortness of breath or breathing problems signs and symptoms of kidney injury like trouble passing urine, change in the amount of urine, or red or dark-brown urine stomach  or side pain, or pain at the shoulder swelling tiredness unusual bleeding or bruising Side effects that usually do not require medical attention (report to your doctor or health care professional if they continue or are bothersome): bone pain cough diarrhea hair loss headache muscle pain This list may not describe all possible side effects. Call your doctor for medical advice about side effects. You may report side effects to FDA at 1-800-FDA-1088. Where should I keep my medication? Keep out of the reach of children. Store in a refrigerator  between 2 and 8 degrees C (36 and 46 degrees F). Do not freeze. Keep in carton to protect from light. Throw away this medicine if vials or syringes are left out of the refrigerator for more than 24 hours. Throw away any unused medicine after the expiration date. NOTE: This sheet is a summary. It may not cover all possible information. If you have questions about this medicine, talk to your doctor, pharmacist, or health care provider.  2022 Elsevier/Gold Standard (2019-09-29 18:47:55)  Influenza (Flu) Vaccine (Inactivated or Recombinant): What You Need to Know 1. Why get vaccinated? Influenza vaccine can prevent influenza (flu). Flu is a contagious disease that spreads around the [place] every year, usually between October and May. Anyone can get the flu, but it is more dangerous for some people. Infants and young children, people 70 years and older, pregnant people, and people with certain health conditions or a weakened immune system are at greatest risk of flu complications. Pneumonia, bronchitis, sinus infections, and ear infections are examples of flu-related complications. If you have a medical condition, such as heart disease, cancer, or diabetes, flu can make it worse. Flu can cause fever and chills, sore throat, muscle aches, fatigue, cough, headache, and runny or stuffy nose. Some people may have vomiting and diarrhea, though this is more common in children than adults. In an average year, thousands of people in the [place] die from flu, and many more are hospitalized. Flu vaccine prevents millions of illnesses and flu-related visits to the doctor each year. 2. Influenza vaccines CDC recommends everyone 6 months and older get vaccinated every flu season. Children 6 months through 58 years of age may need 2 doses during a single flu season. Everyone else needs only 1 dose each flu season. It takes about 2 weeks for protection to develop after vaccination. There are many flu viruses,  and they are always changing. Each year a new flu vaccine is made to protect against the influenza viruses believed to be likely to cause disease in the upcoming flu season. Even when the vaccine doesn't exactly match these viruses, it may still provide some protection. Influenza vaccine does not cause flu. Influenza vaccine may be given at the same time as other vaccines. 3. Talk with your health care provider Tell your vaccination provider if the person getting the vaccine: Has had an allergic reaction after a previous dose of influenza vaccine, or has any severe, life-threatening allergies Has ever had Guillain-Barr Syndrome (also called "GBS") In some cases, your health care provider may decide to postpone influenza vaccination until a future visit. Influenza vaccine can be administered at any time during pregnancy. People who are or will be pregnant during influenza season should receive inactivated influenza vaccine. People with minor illnesses, such as a cold, may be vaccinated. People who are moderately or severely ill should usually wait until they recover before getting influenza vaccine. Your health care provider can give you more information. 4. Risks  of a vaccine reaction Soreness, redness, and swelling where the shot is given, fever, muscle aches, and headache can happen after influenza vaccination. There may be a very small increased risk of Guillain-Barr Syndrome (GBS) after inactivated influenza vaccine (the flu shot). Young children who get the flu shot along with pneumococcal vaccine (PCV13) and/or DTaP vaccine at the same time might be slightly more likely to have a seizure caused by fever. Tell your health care provider if a child who is getting flu vaccine has ever had a seizure. People sometimes faint after medical procedures, including vaccination. Tell your provider if you feel dizzy or have vision changes or ringing in the ears. As with any medicine, there is a very remote  chance of a vaccine causing a severe allergic reaction, other serious injury, or death. 5. What if there is a serious problem? An allergic reaction could occur after the vaccinated person leaves the clinic. If you see signs of a severe allergic reaction (hives, swelling of the face and throat, difficulty breathing, a fast heartbeat, dizziness, or weakness), call 9-1-1 and get the person to the nearest hospital. For other signs that concern you, call your health care provider. Adverse reactions should be reported to the Vaccine Adverse Event Reporting System (VAERS). Your health care provider will usually file this report, or you can do it yourself. Visit the VAERS website at www.vaers.SamedayNews.es or call 3375370639. VAERS is only for reporting reactions, and VAERS staff members do not give medical advice. 6. The National Vaccine Injury Compensation Program The [employer] Vaccine Injury Compensation Program (VICP) is a federal program that was created to compensate people who may have been injured by certain vaccines. Claims regarding alleged injury or death due to vaccination have a time limit for filing, which may be as short as two years. Visit the VICP website at GoldCloset.com.ee or call (251)464-0238 to learn about the program and about filing a claim. 7. How can I learn more? Ask your health care provider. Call your local or state health department. Visit the website of the Food and Drug Administration (FDA) for vaccine package inserts and additional information at TraderRating.uy. Contact the Centers for Disease Control and Prevention (CDC): Call 425-241-0288 (1-800-CDC-INFO) or Visit CDC's website at https://gibson.com/. Vaccine Information Statement Inactivated Influenza Vaccine (04/27/2020) This information is not intended to replace advice given to you by your health care provider. Make sure you discuss any questions you have with your health care  provider. Document Revised: 06/14/2020 Document Reviewed: 06/14/2020 Elsevier Patient Education  2022 [employer].

## 2021-07-12 ENCOUNTER — Encounter: Payer: Self-pay | Admitting: Internal Medicine

## 2021-07-12 ENCOUNTER — Other Ambulatory Visit: Payer: Self-pay | Admitting: Internal Medicine

## 2021-07-12 MED ORDER — TRAZODONE HCL 50 MG PO TABS: 50.0000 mg | ORAL_TABLET | Freq: Every day | ORAL | 3 refills | Status: AC

## 2021-07-18 ENCOUNTER — Other Ambulatory Visit: Payer: Self-pay | Admitting: Radiation Therapy

## 2021-07-22 ENCOUNTER — Other Ambulatory Visit: Payer: Self-pay | Admitting: Internal Medicine

## 2021-07-25 ENCOUNTER — Other Ambulatory Visit: Payer: Self-pay

## 2021-07-25 ENCOUNTER — Inpatient Hospital Stay: Payer: 59

## 2021-07-25 ENCOUNTER — Inpatient Hospital Stay: Payer: 59 | Attending: Neurosurgery

## 2021-07-25 ENCOUNTER — Inpatient Hospital Stay (HOSPITAL_BASED_OUTPATIENT_CLINIC_OR_DEPARTMENT_OTHER): Payer: 59 | Admitting: Internal Medicine

## 2021-07-25 VITALS — BP 136/95 | HR 55 | Temp 95.9°F | Resp 17 | Wt 195.8 lb

## 2021-07-25 VITALS — BP 123/86 | HR 59

## 2021-07-25 DIAGNOSIS — R269 Unspecified abnormalities of gait and mobility: Secondary | ICD-10-CM | POA: Insufficient documentation

## 2021-07-25 DIAGNOSIS — C719 Malignant neoplasm of brain, unspecified: Secondary | ICD-10-CM

## 2021-07-25 DIAGNOSIS — R2689 Other abnormalities of gait and mobility: Secondary | ICD-10-CM | POA: Diagnosis not present

## 2021-07-25 DIAGNOSIS — Z79899 Other long term (current) drug therapy: Secondary | ICD-10-CM | POA: Insufficient documentation

## 2021-07-25 DIAGNOSIS — F1020 Alcohol dependence, uncomplicated: Secondary | ICD-10-CM | POA: Diagnosis not present

## 2021-07-25 DIAGNOSIS — M2550 Pain in unspecified joint: Secondary | ICD-10-CM | POA: Diagnosis not present

## 2021-07-25 DIAGNOSIS — R32 Unspecified urinary incontinence: Secondary | ICD-10-CM | POA: Diagnosis not present

## 2021-07-25 DIAGNOSIS — R531 Weakness: Secondary | ICD-10-CM | POA: Diagnosis not present

## 2021-07-25 DIAGNOSIS — I10 Essential (primary) hypertension: Secondary | ICD-10-CM | POA: Diagnosis not present

## 2021-07-25 DIAGNOSIS — Z5112 Encounter for antineoplastic immunotherapy: Secondary | ICD-10-CM | POA: Diagnosis not present

## 2021-07-25 DIAGNOSIS — Z923 Personal history of irradiation: Secondary | ICD-10-CM | POA: Insufficient documentation

## 2021-07-25 DIAGNOSIS — Z87442 Personal history of urinary calculi: Secondary | ICD-10-CM | POA: Insufficient documentation

## 2021-07-25 DIAGNOSIS — R569 Unspecified convulsions: Secondary | ICD-10-CM

## 2021-07-25 DIAGNOSIS — Z7952 Long term (current) use of systemic steroids: Secondary | ICD-10-CM | POA: Insufficient documentation

## 2021-07-25 LAB — CBC WITH DIFFERENTIAL (CANCER CENTER ONLY)
Abs Immature Granulocytes: 0.05 10*3/uL (ref 0.00–0.07)
Basophils Absolute: 0 10*3/uL (ref 0.0–0.1)
Basophils Relative: 1 %
Eosinophils Absolute: 0.2 10*3/uL (ref 0.0–0.5)
Eosinophils Relative: 3 %
HCT: 43.6 % (ref 39.0–52.0)
Hemoglobin: 14.7 g/dL (ref 13.0–17.0)
Immature Granulocytes: 1 %
Lymphocytes Relative: 17 %
Lymphs Abs: 0.9 10*3/uL (ref 0.7–4.0)
MCH: 31.1 pg (ref 26.0–34.0)
MCHC: 33.7 g/dL (ref 30.0–36.0)
MCV: 92.4 fL (ref 80.0–100.0)
Monocytes Absolute: 0.7 10*3/uL (ref 0.1–1.0)
Monocytes Relative: 13 %
Neutro Abs: 3.5 10*3/uL (ref 1.7–7.7)
Neutrophils Relative %: 65 %
Platelet Count: 204 10*3/uL (ref 150–400)
RBC: 4.72 MIL/uL (ref 4.22–5.81)
RDW: 14 % (ref 11.5–15.5)
WBC Count: 5.4 10*3/uL (ref 4.0–10.5)
nRBC: 0 % (ref 0.0–0.2)

## 2021-07-25 LAB — CMP (CANCER CENTER ONLY)
ALT: 20 U/L (ref 0–44)
AST: 21 U/L (ref 15–41)
Albumin: 3.6 g/dL (ref 3.5–5.0)
Alkaline Phosphatase: 42 U/L (ref 38–126)
Anion gap: 10 (ref 5–15)
BUN: 12 mg/dL (ref 6–20)
CO2: 23 mmol/L (ref 22–32)
Calcium: 9.1 mg/dL (ref 8.9–10.3)
Chloride: 106 mmol/L (ref 98–111)
Creatinine: 0.84 mg/dL (ref 0.61–1.24)
GFR, Estimated: >60 mL/min (ref >60)
Glucose, Bld: 90 mg/dL (ref 70–99)
Potassium: 4.2 mmol/L (ref 3.5–5.1)
Sodium: 139 mmol/L (ref 135–145)
Total Bilirubin: 0.6 mg/dL (ref 0.3–1.2)
Total Protein: 7.1 g/dL (ref 6.5–8.1)

## 2021-07-25 LAB — TOTAL PROTEIN, URINE DIPSTICK: Protein, ur: <30 mg/dL — AB

## 2021-07-25 MED ORDER — SODIUM CHLORIDE 0.9 % IV SOLN
10.0000 mg/kg | Freq: Once | INTRAVENOUS | Status: AC
  Administered 2021-07-25: 900 mg via INTRAVENOUS
  Filled 2021-07-25: qty 32

## 2021-07-25 MED ORDER — SODIUM CHLORIDE 0.9 % IV SOLN: Freq: Once | INTRAVENOUS | Status: AC

## 2021-07-25 NOTE — Patient Instructions (Signed)
[facility]  Discharge Instructions: Thank you for choosing [facility] to provide your oncology and hematology care.   If you have a lab appointment with the Cancer Center, please go directly to the Cancer Center and check in at the registration area.   Wear comfortable clothing and clothing appropriate for easy access to any Portacath or PICC line.   We strive to give you quality time with your provider. You may need to reschedule your appointment if you arrive late (15 or more minutes).  Arriving late affects you and other patients whose appointments are after yours.  Also, if you miss three or more appointments without notifying the office, you may be dismissed from the clinic at the provider's discretion.      For prescription refill requests, have your pharmacy contact our office and allow 72 hours for refills to be completed.    Today you received the following chemotherapy and/or immunotherapy agent: Bevacizumab   To help prevent nausea and vomiting after your treatment, we encourage you to take your nausea medication as directed.  BELOW ARE SYMPTOMS THAT SHOULD BE REPORTED IMMEDIATELY: *FEVER GREATER THAN 100.4 F (38 C) OR HIGHER *CHILLS OR SWEATING *NAUSEA AND VOMITING THAT IS NOT CONTROLLED WITH YOUR NAUSEA MEDICATION *UNUSUAL SHORTNESS OF BREATH *UNUSUAL BRUISING OR BLEEDING *URINARY PROBLEMS (pain or burning when urinating, or frequent urination) *BOWEL PROBLEMS (unusual diarrhea, constipation, pain near the anus) TENDERNESS IN MOUTH AND THROAT WITH OR WITHOUT PRESENCE OF ULCERS (sore throat, sores in mouth, or a toothache) UNUSUAL RASH, SWELLING OR PAIN  UNUSUAL VAGINAL DISCHARGE OR ITCHING   Items with * indicate a potential emergency and should be followed up as soon as possible or go to the Emergency Department if any problems should occur.  Please show the CHEMOTHERAPY ALERT CARD or IMMUNOTHERAPY ALERT CARD at check-in to  the Emergency Department and triage nurse.  Should you have questions after your visit or need to cancel or reschedule your appointment, please contact [facility]  Dept: [phone]  and follow the prompts.  Office hours are 8:00 a.m. to 4:30 p.m. Monday - Friday. Please note that voicemails left after 4:00 p.m. may not be returned until the following business day.  We are closed weekends and major holidays. You have access to a nurse at all times for urgent questions. Please call the main number to the clinic Dept: [phone] and follow the prompts.   For any non-urgent questions, you may also contact your provider using MyChart. We now offer e-Visits for anyone 18 and older to request care online for non-urgent symptoms. For details visit [email or web address].   Also download the MyChart app! Go to the app store, search "MyChart", open the app, select [facility], and log in with your MyChart username and password.  Due to Covid, a mask is required upon entering the hospital/clinic. If you do not have a mask, one will be given to you upon arrival. For doctor visits, patients may have 1 support person aged 18 or older with them. For treatment visits, patients cannot have anyone with them due to current Covid guidelines and our immunocompromised population.   

## 2021-07-25 NOTE — Progress Notes (Signed)
[facility] [place], [place] [address] [phone]   Interval Evaluation  Date of Service: 07/25/21 Patient Name: Christopher Tucker Patient MRN: [id number] Patient DOB: 10/03/1960 Provider: [name], MD  Identifying Statement:  Christopher Tucker is a 60 y.o. male with left temporal anaplastic astrocytoma   Oncologic History: Oncology History  Glioblastoma, IDH-wildtype ([place])  05/14/2020 Initial Diagnosis   Anaplastic astrocytoma, IDH-wildtype ([place])   05/21/2020 - 07/02/2020 Radiation Therapy   IMRT and concurrent Temodar with Dr. Lisbeth Renshaw   07/30/2020 - 08/07/2020 Chemotherapy    Patient is on Treatment Plan: BRAIN GBM DUKE IRINOTECAN + BEVACIZUMAB D1,15 Q 28D   Patient is on Antibody Plan: BRAIN GBM BEVACIZUMAB 14D X 6 CYCLES     10/18/2020 -  Chemotherapy   Patient is on Treatment Plan : BRAIN GBM Bevacizumab 14d x 6 cycles     04/18/2021 - 05/30/2021 Chemotherapy      Patient is on Antibody Plan: BRAIN GBM BEVACIZUMAB 14D X 6 CYCLES     06/13/2021 -  Chemotherapy   Patient is on Treatment Plan : BRAIN GLIOBLASTOMA Consolidation Temozolomide Days 1-5 q28 Days         Biomarkers:  MGMT Unknown.  IDH 1/2 Wild type.  EGFR Amplified  TERT "Mutated   Interval History:  Christopher Tucker presents today for avastin infusion, now in the middle of cycle #2 of 5-day Temodar. He and his wife again report increase in fatigue, lethargy and word finding difficulty.  He is currently sleeping at least 18 hours per day, which is increased from prior.  Gait remains independent, though he is dragging the right leg a bit more.  Denies seizures or headaches.  Depakote still at 539m twice per day.  Continues to walk ~1 mile per day.  Decadron: 10/05/20: 8173m01/28/22: 73m48m2/24/22: 1mg68m/04/22: - 06/27/21: 2mg 56mP (05/10/20) Patient presented to medical attention in early August with several months history of sensory episodes.  They are  described as "numbness marching down right arm also involving the leg, lasting for several minutes".  There is no post event weakness or confusion.  No known history of seizures.  In recent weeks he has also experienced some difficulty with expressive language, finding particular words and maintaining high fluency.  Since starting Keppra post-operatively he has not had any recurrence of these sensory events.  He has weaned off dexamethasone.  We are still awaiting finalized pathology results.    Medications: Current Outpatient Medications on File Prior to Visit  Medication Sig Dispense Refill   amLODipine (NORVASC) 5 MG tablet Take 1 tablet (5 mg total) by mouth daily. 30 tablet 3   divalproex (DEPAKOTE) 500 MG DR tablet TAKE 2 TABLETS BY MOUTH TWICE A DAY 120 tablet 3   Metoprolol Tartrate 75 MG TABS TAKE 1 TABLET BY MOUTH TWICE A DAY 60 tablet 2   ondansetron (ZOFRAN) 8 MG tablet Take 1 tablet (8 mg total) by mouth 2 (two) times daily as needed (nausea and vomiting). May take 30-60 minutes prior to Temodar administration if nausea/vomiting occurs. 30 tablet 1   prochlorperazine (COMPAZINE) 10 MG tablet Take 1 tablet (10 mg total) by mouth every 6 (six) hours as needed for nausea or vomiting. 30 tablet 0   sertraline (ZOLOFT) 25 MG tablet TAKE 1 TABLET (25 MG TOTAL) BY MOUTH DAILY. 30 tablet 3   dronabinol (MARINOL) 2.5 MG capsule Take 1 capsule (2.5 mg total) by mouth 2 (  two) times daily before a meal. (Patient not taking: No sig reported) 60 capsule 2   gabapentin (NEURONTIN) 300 MG capsule Take 1 capsule (300 mg total) by mouth 2 (two) times daily. (Patient not taking: No sig reported) 60 capsule 3   hydrocortisone (CORTEF) 10 MG tablet TAKE 1 TABLET BY MOUTH TWICE A DAY (Patient not taking: No sig reported) 60 tablet 1   ibuprofen (ADVIL) 200 MG tablet Take 400 mg by mouth 2 (two) times daily. (Patient not taking: No sig reported)     methylphenidate (RITALIN) 5 MG tablet Take 1 tablet (5 mg  total) by mouth 2 (two) times daily. (Patient not taking: No sig reported) 60 tablet 0   simvastatin (ZOCOR) 40 MG tablet Take 40 mg by mouth at bedtime. (Patient not taking: No sig reported)     traZODone (DESYREL) 50 MG tablet Take 1 tablet (50 mg total) by mouth at bedtime. (Patient not taking: Reported on 07/25/2021) 30 tablet 3   No current facility-administered medications on file prior to visit.    Allergies:  Allergies  Allergen Reactions   Pravastatin     Other reaction(s): muscle aches   Past Medical History:  Past Medical History:  Diagnosis Date   High cholesterol    History of kidney stones    Past Surgical History:  Past Surgical History:  Procedure Laterality Date   APPLICATION OF CRANIAL NAVIGATION Left 04/25/2020   Procedure: APPLICATION OF CRANIAL NAVIGATION;  Surgeon: Consuella Lose, MD;  Location: [facility];  Service: Neurosurgery;  Laterality: Left;   CRANIOTOMY Left 04/25/2020   Procedure: STEREOTACTIC LEFT TEMPORAL CRANIOTOMY FOR TUMOR;  Surgeon: Consuella Lose, MD;  Location: [facility];  Service: Neurosurgery;  Laterality: Left;   HERNIA REPAIR Left    groin    Social History:  Social History   Socioeconomic History   Marital status: Married    Spouse name: Not on file   Number of children: Not on file   Years of education: Not on file   Highest education level: Not on file  Occupational History   Not on file  Tobacco Use   Smoking status: Never   Smokeless tobacco: Never  Substance and Sexual Activity   Alcohol use: Yes    Alcohol/week: 6.0 standard drinks    Types: 3 Glasses of wine, 3 Cans of beer per week   Drug use: Not Currently   Sexual activity: Not on file  Other Topics Concern   Not on file  Social History Narrative   Not on file   Social Determinants of Health   Financial Resource Strain: Not on file  Food Insecurity: Not on file  Transportation Needs: Not on file  Physical Activity: Not on file  Stress: Not on file  Social  Connections: Not on file  Intimate Partner Violence: Not on file   Family History: No family history on file.  Review of Systems: Constitutional: Doesn't report fevers, chills or abnormal weight loss Eyes: Doesn't report blurriness of vision Ears, nose, mouth, throat, and face: Doesn't report sore throat Respiratory: Doesn't report cough, dyspnea or wheezes Cardiovascular: Doesn't report palpitation, chest discomfort  Gastrointestinal:  Doesn't report nausea, constipation, diarrhea GU: Doesn't report incontinence Skin: torso rash Neurological: Per HPI Musculoskeletal: Doesn't report joint pain Behavioral/Psych: Doesn't report anxiety  Physical Exam: Vitals:   07/25/21 0950  BP: (!) 136/95  Pulse: (!) 55  Resp: 17  Temp: (!) 95.9 F (35.5 C)  SpO2: 99%   KPS: 70. General: Comfortable,  NAD Head: Normal EENT: No conjunctival injection or scleral icterus.  Lungs: Resp effort normal Cardiac: Regular rate Abdomen: Non-distended abdomen Skin: normal Extremities: normal  Neurologic Exam: Mental Status: Awake, alert, attentive to examiner. Oriented to self and environment. Language c/w transcortical expressive dyshpasia Cranial Nerves: Visual acuity is grossly normal. Right field hemianopia. Extra-ocular movements intact. No ptosis. Face is symmetric Motor: Tone and bulk are normal. 4+/5 in right arm and leg. Reflexes are symmetric, no pathologic reflexes present.  Sensory: Intact to light touch Gait: Dystaxic, hemiparetic  Labs: I have reviewed the data as listed    Component Value Date/Time   NA 139 07/25/2021 0932   K 4.2 07/25/2021 0932   CL 106 07/25/2021 0932   CO2 23 07/25/2021 0932   GLUCOSE 90 07/25/2021 0932   BUN 12 07/25/2021 0932   CREATININE 0.84 07/25/2021 0932   CALCIUM 9.1 07/25/2021 0932   PROT 7.1 07/25/2021 0932   ALBUMIN 3.6 07/25/2021 0932   AST 21 07/25/2021 0932   ALT 20 07/25/2021 0932   ALKPHOS 42 07/25/2021 0932   BILITOT 0.6 07/25/2021  0932   GFRNONAA >60 07/25/2021 0932   GFRAA >60 06/14/2020 0828   Lab Results  Component Value Date   WBC 5.4 07/25/2021   NEUTROABS 3.5 07/25/2021   HGB 14.7 07/25/2021   HCT 43.6 07/25/2021   MCV 92.4 07/25/2021   PLT 204 07/25/2021     Assessment/Plan Glioblastoma, IDH-wildtype ([place]) [C71.9]  Christopher Tucker is clinically stable today.  We recommended continuing treatment with cycle #2 Temozolomide 166m/m2, on for five days and off for twenty three days in twenty eight day cycles. The patient will have a complete blood count performed on days 21 and 28 of each cycle, and a comprehensive metabolic panel performed on day 28 of each cycle. Labs may need to be performed more often. Zofran will prescribed for home use for nausea/vomiting.   Cycle #2 began on 10/27 as instructed.  He will receive neulasta today for bone marrow support.  He is clear to dose Avastin as well today despite mild proteinuria.  Chemotherapy should be held for the following:  ANC less than 1,000  Platelets less than 100,000  LFT or creatinine greater than 2x ULN  If clinical concerns/contraindications develop   Avastin should be held for the following:  ANC less than 1500  Platelets less than 50,000  LFT or creatinine greater than 2x ULN  If clinical concerns/contraindications develop  We discussed re-trial of Ritalin 535mBID and/or dexamethasone 34m31maily for fatigue, energy.  Will con't metoprolol 48m72mD, amlodipine 5mg 73mly.  Ok to continue Depakote 500mg 634m no breathrough seizures.  We ask that Christopher Tucker to in-person clinic in 2 weeks with MRI brain for evaluation.  All questions were answered. The patient knows to call the clinic with any problems, questions or concerns. No barriers to learning were detected.  I have spent a total of 30 minutes of face-to-face and non-face-to-face time, excluding clinical staff time, preparing to see patient, ordering tests and/or  medications, counseling the patient, and documenting clinical information in the electronic or other health record    ZacharVentura Sellersedical Director of Neuro-Oncology [facility]oday/22 10:23 AM

## 2021-07-26 ENCOUNTER — Other Ambulatory Visit: Payer: Self-pay | Admitting: Internal Medicine

## 2021-07-26 DIAGNOSIS — C719 Malignant neoplasm of brain, unspecified: Secondary | ICD-10-CM

## 2021-08-06 ENCOUNTER — Encounter: Payer: Self-pay | Admitting: Internal Medicine

## 2021-08-07 ENCOUNTER — Ambulatory Visit (HOSPITAL_COMMUNITY)
Admission: RE | Admit: 2021-08-07 | Discharge: 2021-08-07 | Disposition: A | Discharge status: Home / Self Care | Payer: 59 | Source: Ambulatory Visit | Attending: Internal Medicine | Admitting: Internal Medicine

## 2021-08-07 ENCOUNTER — Other Ambulatory Visit: Payer: Self-pay

## 2021-08-07 DIAGNOSIS — C719 Malignant neoplasm of brain, unspecified: Secondary | ICD-10-CM | POA: Insufficient documentation

## 2021-08-07 MED ORDER — GADOBUTROL 1 MMOL/ML IV SOLN
9.0000 mL | Freq: Once | INTRAVENOUS | Status: AC | PRN
  Administered 2021-08-07: 9 mL via INTRAVENOUS

## 2021-08-08 ENCOUNTER — Inpatient Hospital Stay: Payer: 59

## 2021-08-08 ENCOUNTER — Inpatient Hospital Stay (HOSPITAL_BASED_OUTPATIENT_CLINIC_OR_DEPARTMENT_OTHER): Payer: 59 | Admitting: Nurse Practitioner

## 2021-08-08 ENCOUNTER — Inpatient Hospital Stay (HOSPITAL_BASED_OUTPATIENT_CLINIC_OR_DEPARTMENT_OTHER): Payer: 59 | Admitting: Internal Medicine

## 2021-08-08 VITALS — BP 131/95 | HR 64 | Temp 97.3°F | Resp 20 | Wt 196.1 lb

## 2021-08-08 DIAGNOSIS — G893 Neoplasm related pain (acute) (chronic): Secondary | ICD-10-CM | POA: Diagnosis not present

## 2021-08-08 DIAGNOSIS — R569 Unspecified convulsions: Secondary | ICD-10-CM | POA: Diagnosis not present

## 2021-08-08 DIAGNOSIS — Z515 Encounter for palliative care: Secondary | ICD-10-CM

## 2021-08-08 DIAGNOSIS — R531 Weakness: Secondary | ICD-10-CM | POA: Diagnosis not present

## 2021-08-08 DIAGNOSIS — Z5112 Encounter for antineoplastic immunotherapy: Secondary | ICD-10-CM | POA: Diagnosis not present

## 2021-08-08 DIAGNOSIS — C719 Malignant neoplasm of brain, unspecified: Secondary | ICD-10-CM

## 2021-08-08 LAB — CMP (CANCER CENTER ONLY)
ALT: 19 U/L (ref 0–44)
AST: 18 U/L (ref 15–41)
Albumin: 3.7 g/dL (ref 3.5–5.0)
Alkaline Phosphatase: 43 U/L (ref 38–126)
Anion gap: 12 (ref 5–15)
BUN: 11 mg/dL (ref 6–20)
CO2: 21 mmol/L — ABNORMAL LOW (ref 22–32)
Calcium: 9.2 mg/dL (ref 8.9–10.3)
Chloride: 107 mmol/L (ref 98–111)
Creatinine: 0.81 mg/dL (ref 0.61–1.24)
GFR, Estimated: >60 mL/min (ref >60)
Glucose, Bld: 105 mg/dL — ABNORMAL HIGH (ref 70–99)
Potassium: 4.1 mmol/L (ref 3.5–5.1)
Sodium: 140 mmol/L (ref 135–145)
Total Bilirubin: 0.5 mg/dL (ref 0.3–1.2)
Total Protein: 7.2 g/dL (ref 6.5–8.1)

## 2021-08-08 LAB — CBC WITH DIFFERENTIAL (CANCER CENTER ONLY)
Abs Immature Granulocytes: 0.02 10*3/uL (ref 0.00–0.07)
Basophils Absolute: 0 10*3/uL (ref 0.0–0.1)
Basophils Relative: 1 %
Eosinophils Absolute: 0.1 10*3/uL (ref 0.0–0.5)
Eosinophils Relative: 2 %
HCT: 43.8 % (ref 39.0–52.0)
Hemoglobin: 15.2 g/dL (ref 13.0–17.0)
Immature Granulocytes: 1 %
Lymphocytes Relative: 23 %
Lymphs Abs: 1 10*3/uL (ref 0.7–4.0)
MCH: 31.1 pg (ref 26.0–34.0)
MCHC: 34.7 g/dL (ref 30.0–36.0)
MCV: 89.8 fL (ref 80.0–100.0)
Monocytes Absolute: 0.7 10*3/uL (ref 0.1–1.0)
Monocytes Relative: 18 %
Neutro Abs: 2.3 10*3/uL (ref 1.7–7.7)
Neutrophils Relative %: 55 %
Platelet Count: 184 10*3/uL (ref 150–400)
RBC: 4.88 MIL/uL (ref 4.22–5.81)
RDW: 13.7 % (ref 11.5–15.5)
WBC Count: 4.2 10*3/uL (ref 4.0–10.5)
nRBC: 0 % (ref 0.0–0.2)

## 2021-08-08 LAB — TOTAL PROTEIN, URINE DIPSTICK: Protein, ur: <30 mg/dL — AB

## 2021-08-08 MED ORDER — OXYCODONE HCL 5 MG PO TABS
5.0000 mg | ORAL_TABLET | Freq: Four times a day (QID) | ORAL | 0 refills | Status: AC | PRN
Start: 2021-08-08 — End: 2021-09-07

## 2021-08-08 NOTE — Progress Notes (Signed)
[facility], [place] [address] [phone]   Interval Evaluation  Date of Service: 08/08/21 Patient Name: Christopher Tucker Patient MRN: [id number] Patient DOB: 1961-01-05 Provider: [name], MD  Identifying Statement:  [name] is a 60 y.o. male with left temporal anaplastic astrocytoma   Oncologic History: Oncology History  Glioblastoma, IDH-wildtype ([place])  05/14/2020 Initial Diagnosis   Anaplastic astrocytoma, IDH-wildtype ([place])   05/21/2020 - 07/02/2020 Radiation Therapy   IMRT and concurrent Temodar with Dr. Lisbeth Renshaw   07/30/2020 - 08/07/2020 Chemotherapy    Patient is on Treatment Plan: BRAIN GBM DUKE IRINOTECAN + BEVACIZUMAB D1,15 Q 28D   Patient is on Antibody Plan: BRAIN GBM BEVACIZUMAB 14D X 6 CYCLES     10/18/2020 -  Chemotherapy   Patient is on Treatment Plan : BRAIN GBM Bevacizumab 14d x 6 cycles     04/18/2021 - 05/30/2021 Chemotherapy      Patient is on Antibody Plan: BRAIN GBM BEVACIZUMAB 14D X 6 CYCLES     06/13/2021 -  Chemotherapy   Patient is on Treatment Plan : BRAIN GLIOBLASTOMA Consolidation Temozolomide Days 1-5 q28 Days         Biomarkers:  MGMT Unknown.  IDH 1/2 Wild type.  EGFR Amplified  TERT "Mutated   Interval History:  [name] presents today following recent MRI brain, having completed cycle #2 of 5-day Temodar. He and his wife describe significant increase fatigue, lethargy and word finding difficulty.  He is sleeping more than prior, at least 18-20 hours.  Gait is no longer independent, with use of cane needed.  He did have one fall this weekend without significant injury.  Denies seizures or headaches.  Depakote still at 527m twice per day.    Decadron: 10/05/20: 865m01/28/22: 32m36m2/24/22: 1mg26m/04/22: - 06/27/21: 2mg 60mP (05/10/20) Patient presented to medical attention in early August with several months history of sensory episodes.  They are described as  "numbness marching down right arm also involving the leg, lasting for several minutes".  There is no post event weakness or confusion.  No known history of seizures.  In recent weeks he has also experienced some difficulty with expressive language, finding particular words and maintaining high fluency.  Since starting Keppra post-operatively he has not had any recurrence of these sensory events.  He has weaned off dexamethasone.  We are still awaiting finalized pathology results.    Medications: Current Outpatient Medications on File Prior to Visit  Medication Sig Dispense Refill   Metoprolol Tartrate 75 MG TABS TAKE 1 TABLET BY MOUTH TWICE A DAY 60 tablet 2   sertraline (ZOLOFT) 25 MG tablet TAKE 1 TABLET (25 MG TOTAL) BY MOUTH DAILY. 30 tablet 3   amLODipine (NORVASC) 5 MG tablet Take 1 tablet (5 mg total) by mouth daily. 30 tablet 3   divalproex (DEPAKOTE) 500 MG DR tablet TAKE 2 TABLETS BY MOUTH TWICE A DAY 120 tablet 3   dronabinol (MARINOL) 2.5 MG capsule Take 1 capsule (2.5 mg total) by mouth 2 (two) times daily before a meal. (Patient not taking: Reported on 03/21/2021) 60 capsule 2   gabapentin (NEURONTIN) 300 MG capsule Take 1 capsule (300 mg total) by mouth 2 (two) times daily. (Patient not taking: Reported on 03/21/2021) 60 capsule 3   hydrocortisone (CORTEF) 10 MG tablet TAKE 1 TABLET BY MOUTH TWICE A DAY (Patient not taking: Reported on 03/21/2021) 60 tablet 1   ibuprofen (ADVIL) 200  MG tablet Take 400 mg by mouth 2 (two) times daily. (Patient not taking: Reported on 03/21/2021)     methylphenidate (RITALIN) 5 MG tablet Take 1 tablet (5 mg total) by mouth 2 (two) times daily. (Patient not taking: Reported on 03/21/2021) 60 tablet 0   ondansetron (ZOFRAN) 8 MG tablet Take 1 tablet (8 mg total) by mouth 2 (two) times daily as needed (nausea and vomiting). May take 30-60 minutes prior to Temodar administration if nausea/vomiting occurs. (Patient not taking: Reported on 08/08/2021) 30 tablet 1    prochlorperazine (COMPAZINE) 10 MG tablet Take 1 tablet (10 mg total) by mouth every 6 (six) hours as needed for nausea or vomiting. (Patient not taking: Reported on 08/08/2021) 30 tablet 0   simvastatin (ZOCOR) 40 MG tablet Take 40 mg by mouth at bedtime. (Patient not taking: Reported on 03/21/2021)     traZODone (DESYREL) 50 MG tablet Take 1 tablet (50 mg total) by mouth at bedtime. (Patient not taking: Reported on 07/25/2021) 30 tablet 3   No current facility-administered medications on file prior to visit.    Allergies:  Allergies  Allergen Reactions   Pravastatin     Other reaction(s): muscle aches   Past Medical History:  Past Medical History:  Diagnosis Date   High cholesterol    History of kidney stones    Past Surgical History:  Past Surgical History:  Procedure Laterality Date   APPLICATION OF CRANIAL NAVIGATION Left 04/25/2020   Procedure: APPLICATION OF CRANIAL NAVIGATION;  Surgeon: Consuella Lose, MD;  Location: [facility];  Service: Neurosurgery;  Laterality: Left;   CRANIOTOMY Left 04/25/2020   Procedure: STEREOTACTIC LEFT TEMPORAL CRANIOTOMY FOR TUMOR;  Surgeon: Consuella Lose, MD;  Location: [facility];  Service: Neurosurgery;  Laterality: Left;   HERNIA REPAIR Left    groin    Social History:  Social History   Socioeconomic History   Marital status: Married    Spouse name: Not on file   Number of children: Not on file   Years of education: Not on file   Highest education level: Not on file  Occupational History   Not on file  Tobacco Use   Smoking status: Never   Smokeless tobacco: Never  Substance and Sexual Activity   Alcohol use: Yes    Alcohol/week: 6.0 standard drinks    Types: 3 Glasses of wine, 3 Cans of beer per week   Drug use: Not Currently   Sexual activity: Not on file  Other Topics Concern   Not on file  Social History Narrative   Not on file   Social Determinants of Health   Financial Resource Strain: Not on file  Food Insecurity: Not  on file  Transportation Needs: Not on file  Physical Activity: Not on file  Stress: Not on file  Social Connections: Not on file  Intimate Partner Violence: Not on file   Family History: No family history on file.  Review of Systems: Constitutional: Doesn't report fevers, chills or abnormal weight loss Eyes: Doesn't report blurriness of vision Ears, nose, mouth, throat, and face: Doesn't report sore throat Respiratory: Doesn't report cough, dyspnea or wheezes Cardiovascular: Doesn't report palpitation, chest discomfort  Gastrointestinal:  Doesn't report nausea, constipation, diarrhea GU: Doesn't report incontinence Skin: torso rash Neurological: Per HPI Musculoskeletal: Doesn't report joint pain Behavioral/Psych: Doesn't report anxiety  Physical Exam: Vitals:   08/08/21 1040  BP: (!) 131/95  Pulse: 64  Resp: 20  Temp: (!) 97.3 F (36.3 C)  SpO2: 99%  KPS: 60. General: Comfortable, NAD Head: Normal EENT: No conjunctival injection or scleral icterus.  Lungs: Resp effort normal Cardiac: Regular rate Abdomen: Non-distended abdomen Skin: normal Extremities: normal  Neurologic Exam: Mental Status: Awake, alert, attentive to examiner. Oriented to self and environment. Language c/w transcortical expressive dyshpasia Cranial Nerves: Visual acuity is grossly normal. Right field hemianopia. Extra-ocular movements intact. No ptosis. Face is symmetric Motor: Tone and bulk are normal. 4+/5 in right arm and leg. Reflexes are symmetric, no pathologic reflexes present.  Sensory: Intact to light touch Gait: Dystaxic, hemiparetic  Labs: I have reviewed the data as listed    Component Value Date/Time   NA 139 07/25/2021 0932   K 4.2 07/25/2021 0932   CL 106 07/25/2021 0932   CO2 23 07/25/2021 0932   GLUCOSE 90 07/25/2021 0932   BUN 12 07/25/2021 0932   CREATININE 0.84 07/25/2021 0932   CALCIUM 9.1 07/25/2021 0932   PROT 7.1 07/25/2021 0932   ALBUMIN 3.6 07/25/2021 0932    AST 21 07/25/2021 0932   ALT 20 07/25/2021 0932   ALKPHOS 42 07/25/2021 0932   BILITOT 0.6 07/25/2021 0932   GFRNONAA >60 07/25/2021 0932   GFRAA >60 06/14/2020 0828   Lab Results  Component Value Date   WBC 4.2 08/08/2021   NEUTROABS 2.3 08/08/2021   HGB 15.2 08/08/2021   HCT 43.8 08/08/2021   MCV 89.8 08/08/2021   PLT 184 08/08/2021    Imaging:  [facility] Clinician Interpretation: I have personally reviewed the CNS images as listed.  My interpretation, in the context of the patient's clinical presentation, is progressive disease  MR BRAIN W WO CONTRAST  Result Date: 08/07/2021 CLINICAL DATA:  GBM with resection, follow-up EXAM: MRI HEAD WITHOUT AND WITH CONTRAST TECHNIQUE: Multiplanar, multiecho pulse sequences of the brain and surrounding structures were obtained without and with intravenous contrast. CONTRAST:  63m GADAVIST GADOBUTROL 1 MMOL/ML IV SOLN COMPARISON:  06/07/2021. FINDINGS: Brain: Status post left frontoparietal craniotomy with subjacent resection cavity in the left temporal and parietal lobes. Overall increased surrounding nodular enhancement, with particular increase in abnormal enhancement at the anteromedial aspect of the resection cavity (series 16, image 84), with an area measuring approximately 4.6 x 4.7 x 1.7 cm (series 16, image 84 and series 18, image 16), previously 3.4 x 1.2 x 1.1 cm when remeasured similarly; this area now extends further into the left thalamus and basal ganglia, with additional extension into the left cerebral peduncle and midbrain (series 16, image 79). Nodular enhancement at the posterolateral aspect of the resection cavity has also increased. Increase in surrounding masslike T2 hyperintense signal, which now extends further into the left anterior temporal lobe, left posterior frontal lobe, left parietal lobe left basal ganglia, left thalamus, and left midbrain, with increased mass effect on the left lateral ventricle. Approximately 5 mm  left-to-right midline shift, previously 2 mm when remeasured similarly. Increased blood products within the area of enhancement. No acute infarct hemorrhage, hydrocephalus, or extra-axial collection. Vascular: Normal flow voids. Skull and upper cervical spine: Normal marrow signal. Sinuses/Orbits: Negative. Other: The mastoids are well aerated. IMPRESSION: Increasing areas of enhancement around the resection cavity with increased associated masslike T2 hyperintense signal, both concerning for tumor progression. Abnormal signal now extends into the left basal ganglia, thalamus, and midbrain. Electronically Signed   By: AMerilyn BabaM.D.   On: 08/07/2021 16:00    Assessment/Plan Glioblastoma, IDH-wildtype ([place] [C71.9]  MJahred Tataris clinically progressive today, now having completed 2nd cycle of resumed 5-day  TMZ.  MRI also demonstrates clear progression of disease, both enhancing volume and T2/FLAIR burden are notably increased from prior study.  We had extensive goals of care discussion today.  We offered further salvage chemotherapy, likely carboplatin+avastin, and also discussed referral to hospice services.  They are unsure at this time what preferred path forward is.  They will meet with palliative care provider today.  In light of this discussion and heavy pre-treatment with avastin, will defer the avastin infusion today.   We ask that Gaylan Fauver reach out to [place] in the coming days for further goals of care guidance and implementation.  All questions were answered. The patient knows to call the clinic with any problems, questions or concerns. No barriers to learning were detected.  I have spent a total of 40 minutes of face-to-face and non-face-to-face time, excluding clinical staff time, preparing to see patient, ordering tests and/or medications, counseling the patient, and documenting clinical information in the electronic or other health record    [name], MD Medical Director  of Neuro-Oncology [facility] at [facility] 08/08/21 10:55 AM

## 2021-08-08 NOTE — Progress Notes (Signed)
[facility]  [phone] [phone]) [phone]   Name: Christopher Tucker Date: 08/08/2021 MRN: [id number]  DOB: 05-Mar-1961  Patient Care Team: Alroy Dust, Carlean Jews.Marlou Sa, MD as PCP - General (Family Medicine)    REASON FOR CONSULTATION: Christopher Tucker is a 60 y.o. male with multiple medical problems including glioblastoma s/p craniotomy, IDH-wild-type, and sciatica. Palliative ask to see for symptom management and goals of care.    SOCIAL HISTORY:     reports that he has never smoked. He has never used smokeless tobacco. He reports current alcohol use of about 6.0 standard drinks per week. He reports that he does not currently use drugs.  ADVANCE DIRECTIVES:  Patient reports he has an advanced directive.  MOST form completed today as requested (see below). DNR/DNI (form completed)   CODE STATUS: DNR  PAST MEDICAL HISTORY: Past Medical History:  Diagnosis Date   High cholesterol    History of kidney stones     PAST SURGICAL HISTORY:  Past Surgical History:  Procedure Laterality Date   APPLICATION OF CRANIAL NAVIGATION Left 04/25/2020   Procedure: APPLICATION OF CRANIAL NAVIGATION;  Surgeon: Consuella Lose, MD;  Location: [facility];  Service: Neurosurgery;  Laterality: Left;   CRANIOTOMY Left 04/25/2020   Procedure: STEREOTACTIC LEFT TEMPORAL CRANIOTOMY FOR TUMOR;  Surgeon: Consuella Lose, MD;  Location: [facility];  Service: Neurosurgery;  Laterality: Left;   HERNIA REPAIR Left    groin     HEMATOLOGY/ONCOLOGY HISTORY:  Oncology History  Glioblastoma, IDH-wildtype ([place])  05/14/2020 Initial Diagnosis   Anaplastic astrocytoma, IDH-wildtype ([place])   05/21/2020 - 07/02/2020 Radiation Therapy   IMRT and concurrent Temodar with Dr. Lisbeth Renshaw   07/30/2020 - 08/07/2020 Chemotherapy    Patient is on Treatment Plan: BRAIN GBM DUKE IRINOTECAN + BEVACIZUMAB D1,15 Q 28D   Patient is on Antibody Plan: BRAIN GBM BEVACIZUMAB 14D X 6 CYCLES     10/18/2020 -   Chemotherapy   Patient is on Treatment Plan : BRAIN GBM Bevacizumab 14d x 6 cycles     04/18/2021 - 05/30/2021 Chemotherapy      Patient is on Antibody Plan: BRAIN GBM BEVACIZUMAB 14D X 6 CYCLES     06/13/2021 -  Chemotherapy   Patient is on Treatment Plan : BRAIN GLIOBLASTOMA Consolidation Temozolomide Days 1-5 q28 Days        ALLERGIES:  is allergic to pravastatin.  MEDICATIONS:  Current Outpatient Medications  Medication Sig Dispense Refill   ibuprofen (ADVIL) 200 MG tablet Take 400 mg by mouth 2 (two) times daily.     oxyCODONE (OXY IR/ROXICODONE) 5 MG immediate release tablet Take 1-2 tablets (5-10 mg total) by mouth every 6 (six) hours as needed for severe pain or moderate pain. 90 tablet 0   amLODipine (NORVASC) 5 MG tablet Take 1 tablet (5 mg total) by mouth daily. 30 tablet 3   divalproex (DEPAKOTE) 500 MG DR tablet TAKE 2 TABLETS BY MOUTH TWICE A DAY 120 tablet 3   dronabinol (MARINOL) 2.5 MG capsule Take 1 capsule (2.5 mg total) by mouth 2 (two) times daily before a meal. (Patient not taking: Reported on 03/21/2021) 60 capsule 2   gabapentin (NEURONTIN) 300 MG capsule Take 1 capsule (300 mg total) by mouth 2 (two) times daily. (Patient not taking: Reported on 03/21/2021) 60 capsule 3   hydrocortisone (CORTEF) 10 MG tablet TAKE 1 TABLET BY MOUTH TWICE A DAY (Patient not taking: Reported on 03/21/2021) 60 tablet 1   methylphenidate (RITALIN) 5 MG tablet  Take 1 tablet (5 mg total) by mouth 2 (two) times daily. (Patient not taking: Reported on 03/21/2021) 60 tablet 0   Metoprolol Tartrate 75 MG TABS TAKE 1 TABLET BY MOUTH TWICE A DAY 60 tablet 2   ondansetron (ZOFRAN) 8 MG tablet Take 1 tablet (8 mg total) by mouth 2 (two) times daily as needed (nausea and vomiting). May take 30-60 minutes prior to Temodar administration if nausea/vomiting occurs. (Patient not taking: Reported on 08/08/2021) 30 tablet 1   prochlorperazine (COMPAZINE) 10 MG tablet Take 1 tablet (10 mg total) by mouth every  6 (six) hours as needed for nausea or vomiting. (Patient not taking: Reported on 08/08/2021) 30 tablet 0   sertraline (ZOLOFT) 25 MG tablet TAKE 1 TABLET (25 MG TOTAL) BY MOUTH DAILY. 30 tablet 3   simvastatin (ZOCOR) 40 MG tablet Take 40 mg by mouth at bedtime. (Patient not taking: Reported on 03/21/2021)     traZODone (DESYREL) 50 MG tablet Take 1 tablet (50 mg total) by mouth at bedtime. (Patient not taking: Reported on 07/25/2021) 30 tablet 3   No current facility-administered medications for this visit.    VITAL SIGNS: There were no vitals taken for this visit. There were no vitals filed for this visit.  Estimated body mass index is 27.35 kg/m as calculated from the following:   Height as of 07/11/21: 5' 11"  (1.803 m).   Weight as of an earlier encounter on 08/08/21: 196 lb 1.6 oz (89 kg).  LABS: CBC:    Component Value Date/Time   WBC 4.2 08/08/2021 1023   WBC 3.9 (L) 04/04/2021 0855   HGB 15.2 08/08/2021 1023   HCT 43.8 08/08/2021 1023   PLT 184 08/08/2021 1023   MCV 89.8 08/08/2021 1023   NEUTROABS 2.3 08/08/2021 1023   LYMPHSABS 1.0 08/08/2021 1023   MONOABS 0.7 08/08/2021 1023   EOSABS 0.1 08/08/2021 1023   BASOSABS 0.0 08/08/2021 1023   Comprehensive Metabolic Panel:    Component Value Date/Time   NA 140 08/08/2021 1023   K 4.1 08/08/2021 1023   CL 107 08/08/2021 1023   CO2 21 (L) 08/08/2021 1023   BUN 11 08/08/2021 1023   CREATININE 0.81 08/08/2021 1023   GLUCOSE 105 (H) 08/08/2021 1023   CALCIUM 9.2 08/08/2021 1023   AST 18 08/08/2021 1023   ALT 19 08/08/2021 1023   ALKPHOS 43 08/08/2021 1023   BILITOT 0.5 08/08/2021 1023   PROT 7.2 08/08/2021 1023   ALBUMIN 3.7 08/08/2021 1023    RADIOGRAPHIC STUDIES: MR BRAIN W WO CONTRAST  Result Date: 08/07/2021 CLINICAL DATA:  GBM with resection, follow-up EXAM: MRI HEAD WITHOUT AND WITH CONTRAST TECHNIQUE: Multiplanar, multiecho pulse sequences of the brain and surrounding structures were obtained without and  with intravenous contrast. CONTRAST:  54m GADAVIST GADOBUTROL 1 MMOL/ML IV SOLN COMPARISON:  06/07/2021. FINDINGS: Brain: Status post left frontoparietal craniotomy with subjacent resection cavity in the left temporal and parietal lobes. Overall increased surrounding nodular enhancement, with particular increase in abnormal enhancement at the anteromedial aspect of the resection cavity (series 16, image 84), with an area measuring approximately 4.6 x 4.7 x 1.7 cm (series 16, image 84 and series 18, image 16), previously 3.4 x 1.2 x 1.1 cm when remeasured similarly; this area now extends further into the left thalamus and basal ganglia, with additional extension into the left cerebral peduncle and midbrain (series 16, image 79). Nodular enhancement at the posterolateral aspect of the resection cavity has also increased. Increase in surrounding masslike  T2 hyperintense signal, which now extends further into the left anterior temporal lobe, left posterior frontal lobe, left parietal lobe left basal ganglia, left thalamus, and left midbrain, with increased mass effect on the left lateral ventricle. Approximately 5 mm left-to-right midline shift, previously 2 mm when remeasured similarly. Increased blood products within the area of enhancement. No acute infarct hemorrhage, hydrocephalus, or extra-axial collection. Vascular: Normal flow voids. Skull and upper cervical spine: Normal marrow signal. Sinuses/Orbits: Negative. Other: The mastoids are well aerated. IMPRESSION: Increasing areas of enhancement around the resection cavity with increased associated masslike T2 hyperintense signal, both concerning for tumor progression. Abnormal signal now extends into the left basal ganglia, thalamus, and midbrain. Electronically Signed   By: Merilyn Baba M.D.   On: 08/07/2021 16:00    PERFORMANCE STATUS (ECOG) : 2 - Symptomatic, <50% confined to bed  Review of Systems  Musculoskeletal:  Positive for arthralgias.   Neurological:  Positive for speech difficulty and weakness.       Right side weakness, gait instability   Psychiatric/Behavioral:  Positive for confusion.   Unless otherwise noted, a complete review of systems is negative.  Physical Exam General: NAD Cardiovascular: regular rate and rhythm Pulmonary: normal breathing pattern  Abdomen: soft, nontender, + bowel sounds Extremities: right side weakness, gait instability Neurological: Weakness, alert, dysphasia  IMPRESSION:  This is my initial encounter with Mr. Christopher Tucker.  He presents to the clinic today with his wife [place].  Patient seen by Dr. [name] prior to my appointment.  No acute distress noted.  Complains of some right arm pain and weakness.  I introduced myself and palliative's role in collaboration with his oncology treatment team.  Patient and wife verbalized understanding and appreciation.  Patient and wife have been married for more than 35 years.  They have 2 children (27 and 28), and 2 grandchildren.  He is a former [employer].  Patient is able to perform some ADLs however with moderate assistance.  He most recently has began ambulating with a 4 pronged cane due to gait instability.  Right side weakness.  Wife reports multiple falls with most recent fall several days ago without injury.  Appetite is fair.  Patient is sleeping more, wife estimates about 18 hours a day.  Noticeable increase in fatigue and weakness.  Occasional episodes of incontinence of urine and bowel.  Patient complains of right side upper extremity pain and tingling.  He is currently self-medicating with Tylenol however does not feel this is effective.  Denies constipation.  Occasional nausea which is well controlled with Zofran.  Patient does have some signs of confusion in addition to expressive aphasia.  We discussed at length patient's current illness, disease trajectory, and goals of care.  Mak states he knows his cancer is progressing despite  treatment.  He states "it is what it is" acknowledging he is facing end-of-life.  He and his wife shares he has made peace with this and knows that his life is limited.  He and his family have been spending more time together and most recently took a relaxing trip to the mountains to spend quality time.  Education provided on expectations as his cancer continues to progress.  Patient and wife verbalizes understanding.  They have been given options during previous discussion with Dr. [name] in regards to continue treatment versus focusing on his comfort and considering hospice support.  Patient asked appropriate questions in regards to hospice.  Detailed discussion and education about outpatient hospice support, philosophy, and goals.  We discussed symptom management and focusing on patient's quality of life.  Ayo and his wife are clear and expressed goals to manage all symptoms aggressively and focus on his comfort allowing him to spend what time he has left with his family and friends.  He and his wife are planning on continuing ongoing discussions in regards to next steps.  They plan to contact the clinic and further discuss with Dr. [name] on next week and request to also have follow-up phone visit to discuss their final decisions.  Educational material provided on the differences between palliative and hospice.  Given the significance of patient's disease I approached empathetically discussions around patient's CODE STATUS and advanced directives.  Education provided on full CODE STATUS versus DNR.  Recommendations for DNR provided.  Patient and wife both mutually agreed confirming wishes for DNR.  He shares they have a completed advanced directive but is more focused on durable/financial more than healthcare.  DNR form completed and given to patient to display in the home.  I introduced the MOST form and education provided. MOST form completed as requested.  The patient and family outlined their wishes  for the following treatment decisions:  Cardiopulmonary Resuscitation: Do Not Attempt Resuscitation (DNR/No CPR)  Medical Interventions: Comfort Measures: Keep clean, warm, and dry. Use medication by any route, positioning, wound care, and other measures to relieve pain and suffering. Use oxygen, suction and manual treatment of airway obstruction as needed for comfort. Do not transfer to the hospital unless comfort needs cannot be met in current location.  Antibiotics: No antibiotics (use other measures to relieve symptoms)  IV Fluids: No IV fluids (provide other measures to ensure comfort)  Feeding Tube: No feeding tube    I discussed the importance of continued conversation with family and their medical providers regarding overall plan of care and treatment options, ensuring decisions are within the context of the patients values and GOCs.  All questions answered and support provided.   PLAN: Patient and wife plans to have ongoing goals of care discussions amongst themselves.  They will contact the office for further discussions and phone visit on next week with both Dr. [name] and myself. Oxycodone as needed for pain DNR/DNI (form completed and given to patient, copy on file) MOST form completed and copy on file Detailed education and discussion regarding ongoing palliative support, best case and worst-case scenario, and outpatient hospice.  Educational material provided for further reading. I will plan to have follow-up phone visit on next week.  We will await a call from patient's wife prior to scheduling.   Patient and wife expressed understanding and was in agreement with this plan. He also understands that He can call the clinic at any time with any questions, concerns, or complaints.   Time Total: 55 min  Visit consisted of counseling and education dealing with the complex and emotionally intense issues of symptom management and palliative care in the setting of serious and  potentially life-threatening illness.Greater than 50%  of this time was spent counseling and coordinating care related to the above assessment and plan.  Signed by: Alda Lea, AGPCNP-BC Palliative Medicine Team

## 2021-08-08 NOTE — Progress Notes (Signed)
  Outpatient Palliative Care  ([phone]) [phone] ________________________________ Nursing Assessment:  Name: Rykker Coviello        MRN: [id number]  Date of Service: 08/08/2021 DOB: [birth year] Visit Type: New patient per Dr. [name] and collaboration with [facility]   Support at Visit: [name], wife  Review of Systems: General: Mr. Heims and his wife report increasing weakness and fatigue. Mrs. Erney states he sleeps about 18hrs of the day, on and off. States he is up and down some during the night.  No recent weight loss.  Neuro: Numbness/tingling on R side of body, mainly hands and feet, continuously.  Has a R eye cataract but stated this has gotten worse. Unsteady balance  Difficulty putting sentences and thoughts together at times. Cardiac: None Pulmonary: None  GI: Mr. Barletta reports some incontinence with diarrhea, about twice a day.  Reports appetite being about 50% normal. Not taking supplements.  GU: None Integumentary: Rash on stomach and back. States it doesn't itch, just bothersome knowing it's there. Psychological: Mr. Mcintire reports some anxiety and a lingering feeling of hopelessness. Mrs. Deren states he seems to be more withdrawn. Mr. Luczynski expressed frustration with needing assistance and not being able to do the things he used to do, such as running. Emotional listening and support provided.   Medication Changes/Additions:    Pain Review: Scale of 0-10: none at the moment, but a 7 with movement and getting dressed Location: R side of body Description: aching continuously, sharp with activity Frequency: continuous with acute on-sets  Relief: stated Tylenol is not helping   Social Review: Living Situation: lives with wife [place] Support: [name] and family   Falls in the last 3 months? Yes, Mrs. Marchetti states that he has fallen 6+ times within the last 3 months in different areas of the house. She stated that he is unsteady initially getting up but will also become  unsteady while standing and fall too.  Assistive Device Use? Has begun to use a cane  Functional Status: Moderate assist-Mr. Kosier now needs help getting dressed and with ADLs   ACP Form Review: Yes, Mr. Schear states that he has a Living Will and HPOA. I introduced the MOST form and what it detailed and Mr. Umland stated he did want to go over that. I notified Lexine Baton, NP.   Family/Patient Concerns: Concerns with Mr. Hammers unsteadiness and falls. Also concerned with increasing level of dependence and potential assistance in home needed. I informed Mrs. Patnaude that we would continue to meet with them and, when the time comes that more assistance is needed, we can navigate that for and with them.   Provider notified of assessment and patient/family concerns. Patient instructed to call with any questions or concerns.

## 2021-08-09 ENCOUNTER — Encounter: Payer: Self-pay | Admitting: Internal Medicine

## 2021-08-09 ENCOUNTER — Telehealth: Payer: Self-pay

## 2021-08-09 NOTE — Telephone Encounter (Signed)
Christopher Tucker, Mr. Pickup wife, called me asking about who to call if Christopher Tucker were to have an emergency at home. I clarified with her that she would still call 911, and once they transition to Hospice, she would have a different number to call. She verbalized understanding. She informed me that they have decided to forego treatments and are wanting to go with Hospice. I notified Nikki, NP who is going to follow up with [facility]. All questions answered.

## 2021-08-12 ENCOUNTER — Telehealth: Payer: Self-pay

## 2021-08-12 NOTE — Telephone Encounter (Signed)
I called Mr. Neisen's wife, [name], to follow up with her about the hospice referral that was placed. She stated that [facility] had been to their house to see them and that they were developing a plan. I told her to call us if there were any questions/concerns. Understanding verbalized.

## 2021-08-19 ENCOUNTER — Telehealth: Payer: Self-pay | Admitting: *Deleted

## 2021-08-19 NOTE — Telephone Encounter (Signed)
Received call from [facility] with [facility].  She called to advise that patient has had significant decline since admission into hospice.  States that patient was previously able to stand and pivot but now is unable to walk.  Reports he is not swallowing his food and is holding food in his mouth for prolonged periods.  More confusion and communication is worsening.  Reports wife is feeling overwhelmed with sudden changes and is the sole caregiver.  She wants to have patient transferred to [facility].   Dr [name] is agreeable to this.

## 2021-08-21 ENCOUNTER — Telehealth: Payer: Self-pay

## 2021-08-21 NOTE — Telephone Encounter (Signed)
[name] Pennie Rushing, Tyrone Hourihan's wife, called earlier today explaining that [employer] wanted Elin to go home now that his symptoms were controlled. Holly expressed worry over this tearfully, as she explained that he has declined significantly in the last week. We have spoken with [facility] and are waiting for a reply from them. [name] called back for an update and I explained that we are working with [employer] for a solution. She expressed thankfulness for our help. We will continue to support the Miles's and I explained to her to call us back with any questions/concerns. Understanding verbalized. All questions answered.

## 2021-08-22 ENCOUNTER — Telehealth: Payer: Self-pay | Admitting: *Deleted

## 2021-08-22 NOTE — Telephone Encounter (Signed)
Received and completed FMLA forms for spouse from [employer].  Faxed completed copy back to [phone]

## 2021-08-23 ENCOUNTER — Other Ambulatory Visit: Payer: Self-pay | Admitting: Internal Medicine

## 2021-08-23 ENCOUNTER — Telehealth: Payer: Self-pay

## 2021-08-23 NOTE — Telephone Encounter (Signed)
I spoke with [name], Mr. Stutsman wife, about their current situation with [employer]. She stated that Marquez has improved enough to go home and that they will be headed home on Monday. She stated she is still worried over caring for him at home but does have support from her children. I told her that I would call her Monday afternoon to see how things are going. I reminded her that, if he has a drastic decline again once he's home, to make sure she calls the Hospice nurse. Understanding and gratitude verbalized. Emotional support and listening provided.

## 2021-08-26 ENCOUNTER — Encounter: Payer: Self-pay | Admitting: *Deleted

## 2021-09-02 ENCOUNTER — Other Ambulatory Visit: Payer: Self-pay | Admitting: *Deleted

## 2021-09-02 ENCOUNTER — Telehealth: Payer: Self-pay | Admitting: *Deleted

## 2021-09-02 MED ORDER — QUETIAPINE FUMARATE 25 MG PO TABS
25.0000 mg | ORAL_TABLET | Freq: Every day | ORAL | 0 refills | Status: AC
Start: 2021-09-02 — End: ?

## 2021-09-02 NOTE — Telephone Encounter (Signed)
Izora Gala from [facility] called [phone].  Called to advise that the patient is having more agitation at night and hallucinating.  Reports only eating small bites and sipping some.  Sleeps a lot otherwise.    Patient is already on Lorazepam 0.5 mg q 4 hours and wanted to know if that dose should be increased or if other medication alternative is recommended.   Routed to Dr [name] to advise.

## 2021-09-02 NOTE — Progress Notes (Signed)
See telephone note.

## 2021-09-02 NOTE — Progress Notes (Signed)
Added order for Seroquel 25 mg @ HS.

## 2021-09-03 ENCOUNTER — Other Ambulatory Visit: Payer: 59 | Admitting: Nurse Practitioner

## 2021-09-22 DEATH — deceased

## 2023-07-06 NOTE — Telephone Encounter (Signed)
error
